# Patient Record
Sex: Female | Born: 1965 | Race: White | Hispanic: No | Smoking: Current every day smoker
Health system: Southern US, Community
[De-identification: ages and names within clinical notes are randomized; demographics above are authoritative.]

## PROBLEM LIST (undated history)

## (undated) DIAGNOSIS — Z972 Presence of dental prosthetic device (complete) (partial): Secondary | ICD-10-CM

## (undated) DIAGNOSIS — I10 Essential (primary) hypertension: Secondary | ICD-10-CM

## (undated) DIAGNOSIS — R87629 Unspecified abnormal cytological findings in specimens from vagina: Secondary | ICD-10-CM

## (undated) DIAGNOSIS — F329 Major depressive disorder, single episode, unspecified: Secondary | ICD-10-CM

## (undated) DIAGNOSIS — M797 Fibromyalgia: Secondary | ICD-10-CM

## (undated) DIAGNOSIS — D241 Benign neoplasm of right breast: Secondary | ICD-10-CM

## (undated) DIAGNOSIS — Z8711 Personal history of peptic ulcer disease: Secondary | ICD-10-CM

## (undated) DIAGNOSIS — F419 Anxiety disorder, unspecified: Secondary | ICD-10-CM

## (undated) DIAGNOSIS — Z973 Presence of spectacles and contact lenses: Secondary | ICD-10-CM

## (undated) DIAGNOSIS — K219 Gastro-esophageal reflux disease without esophagitis: Secondary | ICD-10-CM

## (undated) DIAGNOSIS — N281 Cyst of kidney, acquired: Secondary | ICD-10-CM

## (undated) DIAGNOSIS — N901 Moderate vulvar dysplasia: Secondary | ICD-10-CM

## (undated) DIAGNOSIS — Z87410 Personal history of cervical dysplasia: Secondary | ICD-10-CM

## (undated) DIAGNOSIS — K589 Irritable bowel syndrome without diarrhea: Secondary | ICD-10-CM

## (undated) DIAGNOSIS — Z8719 Personal history of other diseases of the digestive system: Secondary | ICD-10-CM

## (undated) DIAGNOSIS — F32A Depression, unspecified: Secondary | ICD-10-CM

## (undated) HISTORY — DX: Essential (primary) hypertension: I10

## (undated) HISTORY — PX: OTHER SURGICAL HISTORY: SHX169

## (undated) HISTORY — DX: Unspecified abnormal cytological findings in specimens from vagina: R87.629

## (undated) HISTORY — PX: CERVICAL CONIZATION W/BX: SHX1330

---

## 1990-09-23 HISTORY — PX: REPAIR OF PERFORATED ULCER: SHX6065

## 2006-11-13 ENCOUNTER — Inpatient Hospital Stay (HOSPITAL_COMMUNITY): Admission: RE | Admit: 2006-11-13 | Discharge: 2006-11-14 | Payer: Self-pay | Admitting: Obstetrics and Gynecology

## 2006-11-13 ENCOUNTER — Encounter (INDEPENDENT_AMBULATORY_CARE_PROVIDER_SITE_OTHER): Payer: Self-pay | Admitting: *Deleted

## 2006-11-13 HISTORY — PX: VAGINAL HYSTERECTOMY: SUR661

## 2009-01-05 ENCOUNTER — Emergency Department (HOSPITAL_COMMUNITY): Admission: EM | Admit: 2009-01-05 | Discharge: 2009-01-05 | Payer: Self-pay | Admitting: Emergency Medicine

## 2009-03-18 ENCOUNTER — Emergency Department (HOSPITAL_COMMUNITY): Admission: EM | Admit: 2009-03-18 | Discharge: 2009-03-18 | Payer: Self-pay | Admitting: Emergency Medicine

## 2010-10-14 ENCOUNTER — Encounter: Payer: Self-pay | Admitting: Obstetrics and Gynecology

## 2011-02-08 NOTE — H&P (Signed)
NAMEJANARI, [name]                 ACCOUNT NO.:  [id number]   MEDICAL RECORD NO.:  [id number]          PATIENT TYPE:  AMB   LOCATION:  DAY                           FACILITY:  [facility]   PHYSICIAN:  [name], M.D. DATE OF BIRTH:  10-Jul-1966   DATE OF ADMISSION:  11/13/2006  DATE OF DISCHARGE:  LH                              HISTORY & PHYSICAL   ADMISSION DIAGNOSIS:  1. Dysmenorrhea.  2. Dyspareunia.  3. Recurrent cervical dysplasia, status post conization.  4. History of fibromyalgia.   HISTORY OF PRESENT ILLNESS:  This 45 year old female is referred to our  office for evaluation of really bad periods by Dr. Donzetta Johns of  [facility].  She has premenstrual spotting, gets terribly sick right  before her period with chills, cramps, pain, and upset stomach.  She  hurts to the right side a lot for the last two to three months.  She is  not working.  She has no significant weight change premenstrually.  She  does have a history of irritable bowel syndrome since childhood.  She  has been referred and there is a question of endometriosis.  During her  referral, she was being considered for endometrial ablation, but after  discussion of the treatment options, she declines that.  Medical history  is also notable for chronic pain and suspected fibromyalgia.   SURGICAL HISTORY:  1. Tubal ligation in 2002.  2. Also surgery in 1991 at [facility] for bladder sling done      by Dr. Jerre Johns in the past.  3. She describes ulcer surgery in 1991 by Dr. Katrinka Johns at [facility] as being emergency surgery where she was on death's      doorstep.  She has an umbilical and old right upper quadrant cuts      from that.   ALLERGIES:  She is allergic to SULFA.   MEDICATIONS:  1. Tramadol 50 mg daily.  2. Ulcer medication taken daily.   PHYSICAL EXAMINATION:  GENERAL:  Slim, Caucasian female who appears  appropriate, oriented, with somewhat passive demeanor.  HEENT:  Pupils  are equal, round and reactive.  NECK:  Supple.  CHEST:  Clear to auscultation.  ABDOMEN:  Well-healed surgical scars as described earlier.  EXTERNAL GENITALIA:  Normal vaginal exam shows good vaginal length.  There is a tiny cervix.  Uterus was retroverted, retroflexed, mobile  without adnexa _thickening__.  The uterosacral ligaments are palpable  but show no appreciable nodularity.   IMPRESSION:  1. Dysmenorrhea.  2. Dyspareunia.  3. History of cervical dysplasia.  4. History of lifelong irritable bowel syndrome.  5. Fibromyalgia.   PLAN:  Vaginal hysterectomy.  I had a discussion of the pros and cons of  surgery being performed.  The patient was made aware that if we see  anything that shows abnormality or adhesions around the ovaries, that  they will be taken out at the time of surgery.  Our intent is to  complete the procedure vaginally.  If laparoscopy is to be considered,  it will most  likely need to be open laparoscopy given the previous  midline abdominal surgical scars.      [name], M.D.  Electronically Signed     JVF/MEDQ  D:  11/05/2006  T:  11/06/2006  Job:  [id number]   cc:   [name]  Fax: [phone]

## 2011-02-08 NOTE — Op Note (Signed)
[name], [name]                 ACCOUNT NO.:  [id number]   MEDICAL RECORD NO.:  [id number]          PATIENT TYPE:  INP   LOCATION:  A419                          FACILITY:  [facility]   PHYSICIAN:  Tilda Burrow, M.D. DATE OF BIRTH:  [birth year]   DATE OF PROCEDURE:  11/13/2006  DATE OF DISCHARGE:                               OPERATIVE REPORT   PREOP:  1. Dysmenorrhea.  2. Dyspareunia.  3. History of cervical dysplasia.   POSTOP:  1. Dysmenorrhea.  2. Dyspareunia.  3. History of cervical dysplasia.   PROCEDURE:  Vaginal hysterectomy.   SURGEON:  Ferguson.   ASSISTANT:  Dallas, R.N.   ANESTHESIA:  General.   COMPLICATIONS:  None.   FINDINGS:  1. Healthy vaginal tissues.  2. A small amount of residual cervical dysplasia.  3. No evidence of endometriosis on uterosacral ligaments or ovaries.  4. On visual inspection, lax ovarian support bilaterally.  5. Small cystocele anterior warranting trimming of the anterior      vaginal mucosa.   DETAILS OF PROCEDURE:  The patient was taken to the operating room,  prepped and draped for vaginal procedure with high lithotomy leg  support.  Abdomen and perineum were prepped and draped.  A posterior  colpotomy incision was made with a weighted 3-inch speculum in place.  The cervix was circumscribed with Bovie and bladder flap developed  anteriorly but peritoneum not entered.  We then proceeded with curved  Zepplin clamp, cross-clamping of the uterosacral ligaments on either  side, suture ligating these and tagging them with curved hemostats.  The  lower cardinal ligament were then clamped, cut and suture ligated in  small bites marching up through the upper cardinal ligaments, clamping,  cutting and suture ligating without difficulty.  The uterine vessels  were incorporated in these ligatures and the broad ligament serially  clamped with Zepplin clamp, transected with Mayo scissors and suture  ligated always with 0 chromic suture.   Marched up to the level of the  utero-ovarian ligaments which were clamped, cut and suture ligated  separately on the left.  The ovary was inspected and found to have no  evidence of endometriosis.  The right side was treated similarly with  clamping, cutting and suture ligating of the round ligament, fallopian  tube and utero-ovarian ligament in one single pedicle.  The pedicles  were inspected and there was some oozing from the area of the  uterosacral ligament clampings on first the left side, which was treated  with an additional ligature, and then on the right side we replaced the  uterosacral ligament on the right side.  This resulted in completely  satisfactory hemostasis.  The ovaries were inspected bilaterally, no  evidence of endometriosis on either one could be noted.  It was noted  that both tubes and ovaries descend quite well all the way to the cuff.  A #2-0 Prolene short suture was placed attaching the uterosacral  ligament on each side to a portion of the vaginal cuff approximately 1  cm medial to that in order to maximize the vaginal cuff support.  This  was tied down.  The peritoneum was then closed transversely with a  running locking suture of #2-0 chromic.  The vaginal margins status post  hysterectomy were inspected.  The anterior bladder came down enough that  some improvement of anterior support was considered indicated and so a  1.5 x 1.5 cm wedge of skin from the anterior vaginal edge was trimmed  and then horizontal mattress sutures x3 with #2-0 chromic used to pull  this tissue together, improving the apical support beneath the bladder.  The cuff was then closed transversely, resulting in a 3-pronged closure  to the cuff with good hemostasis and satisfactory support.  Vaginal  packing was placed, Foley catheter was inserted and patient went to  recovery room in stable condition.      Tilda Burrow, M.D.  Electronically Signed     JVF/MEDQ  D:   11/13/2006  T:  11/13/2006  Job:  [id number]

## 2011-02-08 NOTE — Discharge Summary (Signed)
NAMEEMBERLYN, [name]                 ACCOUNT NO.:  [id number]   MEDICAL RECORD NO.:  [id number]          PATIENT TYPE:  INP   LOCATION:  A419                          FACILITY:  [facility]   PHYSICIAN:  Tilda Burrow, M.D. DATE OF BIRTH:  [birth year]   DATE OF ADMISSION:  11/13/2006  DATE OF DISCHARGE:  02/22/2008LH                               DISCHARGE SUMMARY   ADMITTING DIAGNOSES:  1. Dysmenorrhea.  2. Dyspareunia.  3. Recurrent cervical dysplasia status post conization.  4. History of fibromyalgia.  5. Irritable bowel syndrome.   DISCHARGE DIAGNOSES:  1. Dysmenorrhea.  2. Dyspareunia.  3. Recurrent cervical dysplasia status post conization.  4. History of fibromyalgia.   PROCEDURE:  Vaginal hysterectomy, J.V. Emelda Fear.   DISCHARGE MEDICATIONS:  1. AcipHex 20 mg p.o. q.d.  2. Ultram 50 mg p.o. t.i.d.  3. Goody Powders p.r.n.  4. Tylox 1 q.4 h. p.r.n. pain.  5. Motrin 800 mg q.8 h.   HOSPITAL COURSE:  This 45 year old female was admitted to the office for  really bad periods.  She was referred from her primary care physician,  Dr. Donzetta Sprung.  She was being considered for endometrial ablation,  but after discussion declines that.  She desires hysterectomy, and given  her low pain threshold associated with fibromyalgia and irritable bowel  syndrome, it was considered reasonable.   HOSPITAL COURSE:  The patient was admitted, underwent vaginal  hysterectomy on February 21 as described in the operative report.  She  had hemoglobin 14.3, hematocrit 42.1 pre-op, 14.2 and 41.6 post-op.  Electrolytes showed a potassium of 3.9, BUN 7, creatinine 0.72.  Pathology report showed proliferative endometrium, no dysplasia of the  cervix.  Normal portions of left fallopian tube and normal posterior  vaginal mucosa.  There was a small trimming of the anterior vaginal  mucosa to reduce the potential for a cystocele in the future.  She was  stable for discharge and routine post-surgical  instructions on November 14, 2006.  Followup for 1 week, then 4 weeks [facility].      Tilda Burrow, M.D.  Electronically Signed     JVF/MEDQ  D:  12/10/2006  T:  12/11/2006  Job:  [id number]

## 2013-08-31 ENCOUNTER — Other Ambulatory Visit: Payer: Self-pay

## 2013-09-29 ENCOUNTER — Emergency Department (HOSPITAL_COMMUNITY)
Admission: EM | Admit: 2013-09-29 | Discharge: 2013-09-29 | Discharge status: Against Medical Advice | Payer: Medicaid Other | Attending: Emergency Medicine | Admitting: Emergency Medicine

## 2013-09-29 ENCOUNTER — Encounter (HOSPITAL_COMMUNITY): Payer: Self-pay | Admitting: Emergency Medicine

## 2013-09-29 DIAGNOSIS — J111 Influenza due to unidentified influenza virus with other respiratory manifestations: Secondary | ICD-10-CM | POA: Insufficient documentation

## 2013-09-29 DIAGNOSIS — F17200 Nicotine dependence, unspecified, uncomplicated: Secondary | ICD-10-CM | POA: Insufficient documentation

## 2013-09-29 HISTORY — DX: Irritable bowel syndrome, unspecified: K58.9

## 2013-09-29 NOTE — ED Notes (Signed)
Pt called to take to treatment area. PT not found in waiting area or outside of ED. Wheelchair that pt was in was empty and blanket was balled up in chair. PT was not in restroom. Others in waiting area state that no pt had been there for a while.

## 2013-09-29 NOTE — ED Notes (Signed)
Pt reports flu like symptoms since yesterday

## 2014-04-15 ENCOUNTER — Emergency Department (HOSPITAL_COMMUNITY)
Admission: EM | Admit: 2014-04-15 | Discharge: 2014-04-15 | Disposition: A | Discharge status: Home / Self Care | Payer: Medicaid Other | Attending: Emergency Medicine | Admitting: Emergency Medicine

## 2014-04-15 ENCOUNTER — Encounter (HOSPITAL_COMMUNITY): Payer: Self-pay | Admitting: Emergency Medicine

## 2014-04-15 DIAGNOSIS — Z79899 Other long term (current) drug therapy: Secondary | ICD-10-CM | POA: Insufficient documentation

## 2014-04-15 DIAGNOSIS — I10 Essential (primary) hypertension: Secondary | ICD-10-CM | POA: Insufficient documentation

## 2014-04-15 DIAGNOSIS — K219 Gastro-esophageal reflux disease without esophagitis: Secondary | ICD-10-CM | POA: Insufficient documentation

## 2014-04-15 DIAGNOSIS — R42 Dizziness and giddiness: Secondary | ICD-10-CM | POA: Insufficient documentation

## 2014-04-15 DIAGNOSIS — F17200 Nicotine dependence, unspecified, uncomplicated: Secondary | ICD-10-CM | POA: Insufficient documentation

## 2014-04-15 DIAGNOSIS — R110 Nausea: Secondary | ICD-10-CM | POA: Diagnosis present

## 2014-04-15 LAB — COMPREHENSIVE METABOLIC PANEL
ALT: 10 U/L (ref 0–35)
ANION GAP: 8 (ref 5–15)
AST: 13 U/L (ref 0–37)
Albumin: 3.7 g/dL (ref 3.5–5.2)
Alkaline Phosphatase: 57 U/L (ref 39–117)
BUN: 7 mg/dL (ref 6–23)
CALCIUM: 9.3 mg/dL (ref 8.4–10.5)
CO2: 29 mEq/L (ref 19–32)
Chloride: 104 mEq/L (ref 96–112)
Creatinine, Ser: 0.72 mg/dL (ref 0.50–1.10)
GFR calc Af Amer: >90 mL/min (ref >90)
Glucose, Bld: 81 mg/dL (ref 70–99)
Potassium: 4.4 mEq/L (ref 3.7–5.3)
Sodium: 141 mEq/L (ref 137–147)
Total Bilirubin: <0.2 mg/dL — ABNORMAL LOW (ref 0.3–1.2)
Total Protein: 6.5 g/dL (ref 6.0–8.3)

## 2014-04-15 LAB — CBC WITH DIFFERENTIAL/PLATELET
BASOS ABS: 0 10*3/uL (ref 0.0–0.1)
BASOS PCT: 1 % (ref 0–1)
EOS PCT: 1 % (ref 0–5)
Eosinophils Absolute: 0.1 10*3/uL (ref 0.0–0.7)
HCT: 46.7 % — ABNORMAL HIGH (ref 36.0–46.0)
Hemoglobin: 15.8 g/dL — ABNORMAL HIGH (ref 12.0–15.0)
LYMPHS ABS: 1.1 10*3/uL (ref 0.7–4.0)
Lymphocytes Relative: 26 % (ref 12–46)
MCH: 35.2 pg — AB (ref 26.0–34.0)
MCHC: 33.8 g/dL (ref 30.0–36.0)
MCV: 104 fL — ABNORMAL HIGH (ref 78.0–100.0)
MONO ABS: 0.4 10*3/uL (ref 0.1–1.0)
Monocytes Relative: 9 % (ref 3–12)
NEUTROS ABS: 2.8 10*3/uL (ref 1.7–7.7)
NEUTROS PCT: 63 % (ref 43–77)
PLATELETS: 346 10*3/uL (ref 150–400)
RBC: 4.49 MIL/uL (ref 3.87–5.11)
RDW: 12.7 % (ref 11.5–15.5)
WBC: 4.4 10*3/uL (ref 4.0–10.5)

## 2014-04-15 LAB — URINE MICROSCOPIC-ADD ON

## 2014-04-15 LAB — URINALYSIS, ROUTINE W REFLEX MICROSCOPIC
Bilirubin Urine: NEGATIVE
GLUCOSE, UA: NEGATIVE mg/dL
Ketones, ur: NEGATIVE mg/dL
Nitrite: NEGATIVE
Protein, ur: NEGATIVE mg/dL
Specific Gravity, Urine: 1.01 (ref 1.005–1.030)
Urobilinogen, UA: 0.2 mg/dL (ref 0.0–1.0)
pH: 6.5 (ref 5.0–8.0)

## 2014-04-15 MED ORDER — ONDANSETRON 8 MG PO TBDP
8.0000 mg | ORAL_TABLET | Freq: Once | ORAL | Status: AC
  Administered 2014-04-15: 8 mg via ORAL
  Filled 2014-04-15: qty 1

## 2014-04-15 NOTE — ED Provider Notes (Signed)
CSN: [id number]     Arrival date & time 04/15/14  1117 History   First MD Initiated Contact with Patient 04/15/14 1145     Chief Complaint  Patient presents with  . Hypertension     (Consider location/radiation/quality/duration/timing/severity/associated sxs/prior Treatment) The history is provided by the patient and a parent (mother at bedside).    NIA NATHANIEL is a 48 y.o. female presenting with complaint of feeling lightheaded which she woke with 3 mornings ago.  She also has mild nausea without emesis.  She describes a slight dizzy sensation, but denies spinning sensation which is most noticeable when she lies down.  She denies headache, fever, visual changes, focal weakness,  Neck pain or stiffness and has had no recent illnesses. She had an elevated blood pressure at work this am 140/86.  Her blood pressure has been low recently so was taken off her Norvasc, her last dose was taken 1 week ago. Additionally she denies chest pain, shortness of breath, palpation and has no peripheral edema.  She works indoors and drinks plenty of fluids,  Denies risk factors for dehydration.   She was seen by her pcp this morning and was told she may have vertigo.  She presents here for confirmation of this diagnosis (pcp did not send her here).      Past Medical History  Diagnosis Date  . IBS (irritable bowel syndrome)   . Gastric ulcer   . Acid reflux    Past Surgical History  Procedure Laterality Date  . Abdominal surgery    . Abdominal hysterectomy    . Bladder suspension     No family history on file. History  Substance Use Topics  . Smoking status: Current Every Day Smoker    Types: Cigarettes  . Smokeless tobacco: Not on file  . Alcohol Use: No   OB History   Grav Para Term Preterm Abortions TAB SAB Ect Mult Living                 Review of Systems  Constitutional: Negative for fever and chills.  HENT: Negative for congestion, ear pain, facial swelling, hearing loss and sore  throat.   Eyes: Negative.  Negative for visual disturbance.  Respiratory: Negative for chest tightness and shortness of breath.   Cardiovascular: Negative for chest pain, palpitations and leg swelling.  Gastrointestinal: Positive for nausea. Negative for abdominal pain.  Genitourinary: Negative.  Negative for difficulty urinating.  Musculoskeletal: Negative for arthralgias, joint swelling and neck pain.  Skin: Negative.  Negative for rash and wound.  Neurological: Positive for light-headedness. Negative for dizziness, weakness, numbness and headaches.  Psychiatric/Behavioral: Negative.       Allergies  Sulfa antibiotics  Home Medications   Prior to Admission medications   Medication Sig Start Date End Date Taking? Authorizing Provider  Aspirin-Acetaminophen (GOODYS BODY PAIN PO) Take 1 Package by mouth daily as needed (pain).   Yes Historical Provider, MD  Multiple Vitamin (MULTIVITAMIN WITH MINERALS) TABS tablet Take 1 tablet by mouth daily.   Yes Historical Provider, MD  omeprazole (PRILOSEC) 20 MG capsule Take 20 mg by mouth daily.   Yes Historical Provider, MD  traMADol (ULTRAM) 50 MG tablet Take 50 mg by mouth every 6 (six) hours as needed for moderate pain.   Yes Historical Provider, MD  vitamin C (ASCORBIC ACID) 500 MG tablet Take 500 mg by mouth daily.   Yes Historical Provider, MD  amLODipine (NORVASC) 10 MG tablet Take 10 mg by mouth daily.  Historical Provider, MD   BP 137/89  Pulse 92  Temp(Src) 98.7 F (37.1 C) (Oral)  Resp 16  Ht 5\' 4"  (1.626 m)  Wt 110 lb (49.896 kg)  BMI 18.87 kg/m2  SpO2 99% Physical Exam  Nursing note and vitals reviewed. Constitutional: She is oriented to person, place, and time. She appears well-developed and well-nourished.  Uncomfortable appearing  HENT:  Head: Normocephalic and atraumatic.  Mouth/Throat: Oropharynx is clear and moist.  Eyes: Conjunctivae and EOM are normal. Pupils are equal, round, and reactive to light.  Neck:  Normal range of motion. Neck supple.  Cardiovascular: Normal rate, regular rhythm, normal heart sounds and intact distal pulses.   Pulmonary/Chest: Effort normal and breath sounds normal. She has no wheezes.  Abdominal: Soft. Bowel sounds are normal. There is no tenderness.  Musculoskeletal: Normal range of motion.  Lymphadenopathy:    She has no cervical adenopathy.  Neurological: She is alert and oriented to person, place, and time. She has normal strength. No sensory deficit. Gait normal. GCS eye subscore is 4. GCS verbal subscore is 5. GCS motor subscore is 6.  Normal heel-shin, normal rapid alternating movements. Cranial nerves III-XII intact.  No pronator drift.  Skin: Skin is warm and dry. No rash noted.  Psychiatric: She has a normal mood and affect. Her speech is normal and behavior is normal. Thought content normal. Cognition and memory are normal.    ED Course  Procedures (including critical care time) Labs Review Labs Reviewed  CBC WITH DIFFERENTIAL - Abnormal; Notable for the following:    Hemoglobin 15.8 (*)    HCT 46.7 (*)    MCV 104.0 (*)    MCH 35.2 (*)    All other components within normal limits  COMPREHENSIVE METABOLIC PANEL - Abnormal; Notable for the following:    Total Bilirubin <0.2 (*)    All other components within normal limits  URINALYSIS, ROUTINE W REFLEX MICROSCOPIC - Abnormal; Notable for the following:    Color, Urine STRAW (*)    APPearance HAZY (*)    Hgb urine dipstick SMALL (*)    Leukocytes, UA SMALL (*)    All other components within normal limits  URINE MICROSCOPIC-ADD ON - Abnormal; Notable for the following:    Bacteria, UA FEW (*)    All other components within normal limits    Imaging Review No results found.   EKG Interpretation   Date/Time:  Friday April 15 2014 11:43:14 EDT Ventricular Rate:  71 PR Interval:  119 QRS Duration: 89 QT Interval:  376 QTC Calculation: 409 R Axis:   55 Text Interpretation:  Sinus rhythm  Borderline short PR interval Low  voltage, precordial leads No old tracing to compare Confirmed by KNAPP   MD-J, JON ([id number]) on 04/15/2014 11:54:54 AM      MDM   Final diagnoses:  Lightheadedness  HTN (hypertension), benign    Labs reviewed,  Pt not orthostatic.  No urinary sx, culture ordered.  She was prescribed meclizine by her pcp today but has not started yet.  Advised to start taking this medicine today.  Advised f/u by pcp within 1 week,  Sooner if sx worsen.  No focal deficits on exam.  Sx most c/w peripheral vertigo.    Evalee Jefferson, PA-C 04/15/14 1401

## 2014-04-15 NOTE — ED Notes (Addendum)
Pt c/o elevated bp since Monday. Pt c/o dizziness/n and  bp 140/86 at work this am. Pt was seen by pcp this am, states they did not do anything for her but mentioned vertigo. Denies ha/cp/numbness/tingling. nad noted.

## 2014-04-15 NOTE — ED Provider Notes (Signed)
Medical screening examination/treatment/procedure(s) were performed by non-physician practitioner and as supervising physician I was immediately available for consultation/collaboration.   EKG Interpretation   Date/Time:  Friday April 15 2014 11:43:14 EDT Ventricular Rate:  71 PR Interval:  119 QRS Duration: 89 QT Interval:  376 QTC Calculation: 409 R Axis:   55 Text Interpretation:  Sinus rhythm Borderline short PR interval Low  voltage, precordial leads No old tracing to compare Confirmed by Makyla Bye   MD-J, Saraann Enneking ([id number]) on 04/15/2014 11:54:54 AM        Dorie Rank, MD 04/15/14 1440

## 2014-04-15 NOTE — Discharge Instructions (Signed)
Dizziness  Dizziness means you feel unsteady or lightheaded. You might feel like you are going to pass out (faint). HOME CARE   Drink enough fluids to keep your pee (urine) clear or pale yellow.  Take your medicines exactly as told by your doctor. If you take blood pressure medicine, always stand up slowly from the lying or sitting position. Hold on to something to steady yourself.  If you need to stand in one place for a long time, move your legs often. Tighten and relax your leg muscles.  Have someone stay with you until you feel okay.  Do not drive or use heavy machinery if you feel dizzy.  Do not drink alcohol. GET HELP RIGHT AWAY IF:   You feel dizzy or lightheaded and it gets worse.  You feel sick to your stomach (nauseous), or you throw up (vomit).  You have trouble talking or walking.  You feel weak or have trouble using your arms, hands, or legs.  You cannot think clearly or have trouble forming sentences.  You have chest pain, belly (abdominal) pain, sweating, or you are short of breath.  Your vision changes.  You are bleeding.  You have problems from your medicine that seem to be getting worse. MAKE SURE YOU:   Understand these instructions.  Will watch your condition.  Will get help right away if you are not doing well or get worse. Document Released: 08/29/2011 Document Revised: 12/02/2011 Document Reviewed: 08/29/2011 [facility] Patient Information 2015 [place], [place]. This information is not intended to replace advice given to you by your health care provider. Make sure you discuss any questions you have with your health care provider.  Vertigo Vertigo means you feel like you are moving when you are not. Vertigo can make you feel like things around you are moving when they are not. This problem often goes away on its own.  HOME CARE   Follow your doctor's instructions.  Avoid driving.  Avoid using heavy machinery.  Avoid doing any activity that  could be dangerous if you have a vertigo attack.  Tell your doctor if a medicine seems to cause your vertigo. GET HELP RIGHT AWAY IF:   Your medicines do not help or make you feel worse.  You have trouble talking or walking.  You feel weak or have trouble using your arms, hands, or legs.  You have bad headaches.  You keep feeling sick to your stomach (nauseous) or throwing up (vomiting).  Your vision changes.  A family member notices changes in your behavior.  Your problems get worse. MAKE SURE YOU:  Understand these instructions.  Will watch your condition.  Will get help right away if you are not doing well or get worse. Document Released: 06/18/2008 Document Revised: 12/02/2011 Document Reviewed: 03/28/2011 [facility] Patient Information 2015 [place], [place]. This information is not intended to replace advice given to you by your health care provider. Make sure you discuss any questions you have with your health care provider.

## 2014-04-15 NOTE — ED Notes (Signed)
MD at bedside. 

## 2014-04-18 LAB — URINE CULTURE: Colony Count: >=100000

## 2014-04-19 ENCOUNTER — Telehealth (HOSPITAL_BASED_OUTPATIENT_CLINIC_OR_DEPARTMENT_OTHER): Payer: Self-pay | Admitting: Emergency Medicine

## 2014-04-19 NOTE — Telephone Encounter (Signed)
Post ED Visit - Positive Culture Follow-up  Culture report reviewed by antimicrobial stewardship pharmacist: []  Antoine, Pharm.D., BCPS [x]  Heide Guile, Pharm.D., BCPS []  Alycia Rossetti, Pharm.D., BCPS []  [place], Pharm.D., BCPS, AAHIVP []  Legrand Como, Pharm.D., BCPS, AAHIVP  Positive urine culture Per [facility] PA-C, no treatment needed and no further patient follow-up is required at this time.  [place], Rex Kras 04/19/2014, 10:02 AM

## 2015-03-13 ENCOUNTER — Emergency Department (HOSPITAL_COMMUNITY)
Admission: EM | Admit: 2015-03-13 | Discharge: 2015-03-13 | Disposition: A | Discharge status: Home / Self Care | Payer: Medicaid Other | Attending: Emergency Medicine | Admitting: Emergency Medicine

## 2015-03-13 ENCOUNTER — Emergency Department (HOSPITAL_COMMUNITY): Payer: Medicaid Other

## 2015-03-13 ENCOUNTER — Encounter (HOSPITAL_COMMUNITY): Payer: Self-pay | Admitting: *Deleted

## 2015-03-13 DIAGNOSIS — K219 Gastro-esophageal reflux disease without esophagitis: Secondary | ICD-10-CM | POA: Diagnosis not present

## 2015-03-13 DIAGNOSIS — Z720 Tobacco use: Secondary | ICD-10-CM | POA: Insufficient documentation

## 2015-03-13 DIAGNOSIS — N644 Mastodynia: Secondary | ICD-10-CM | POA: Diagnosis not present

## 2015-03-13 DIAGNOSIS — M7989 Other specified soft tissue disorders: Secondary | ICD-10-CM | POA: Diagnosis not present

## 2015-03-13 DIAGNOSIS — I10 Essential (primary) hypertension: Secondary | ICD-10-CM | POA: Insufficient documentation

## 2015-03-13 DIAGNOSIS — Z79899 Other long term (current) drug therapy: Secondary | ICD-10-CM | POA: Diagnosis not present

## 2015-03-13 DIAGNOSIS — M79601 Pain in right arm: Secondary | ICD-10-CM

## 2015-03-13 HISTORY — DX: Essential (primary) hypertension: I10

## 2015-03-13 MED ORDER — NAPROXEN 500 MG PO TABS: 500.0000 mg | ORAL_TABLET | Freq: Two times a day (BID) | ORAL | Status: DC

## 2015-03-13 NOTE — ED Notes (Signed)
Pain rt breast, s/p surgery at [facility] in May.  Cont to hurt with movement  And tender to touch.  Had cysts removed and the bx was normal

## 2015-03-13 NOTE — ED Provider Notes (Signed)
CSN: [id number]     Arrival date & time 03/13/15  1147 History  This chart was scribed for Noemi Chapel, MD by Rayna Sexton, ED scribe. This patient was seen in room APA03/APA03 and the patient's care was started at 12:18 PM.    Chief Complaint  Patient presents with  . Breast Pain   The history is provided by the patient. No language interpreter was used.    HPI Comments: JEANNIA TATRO is a 49 y.o. female who presents to the Emergency Department complaining of intermittent, moderate, radiating, generalized pain to her right breast, shoulder and arm with onset 1.5 weeks ago. Pt notes a history of multiple biopsies (all negative) of her right breast as well as cyst removals with her most recent being in May of 2016 at [facility]. She notes a worsening of symptoms when ambulating the arm as well as when palpating the right breast. Pt notes a history of smoking and currently smokes 1 PPD. She denies taking blood thinner, estrogen, or hormone pills. Pt denies a history of breast surgery as well as any associated discharge of the breasts bilaterally. Pt denies any other associated symptoms.    Past Medical History  Diagnosis Date  . IBS (irritable bowel syndrome)   . Gastric ulcer   . Acid reflux   . Hypertension    Past Surgical History  Procedure Laterality Date  . Abdominal surgery    . Abdominal hysterectomy    . Bladder suspension    . Breast surgery     History reviewed. No pertinent family history. History  Substance Use Topics  . Smoking status: Current Every Day Smoker    Types: Cigarettes  . Smokeless tobacco: Not on file  . Alcohol Use: No   OB History    No data available     Review of Systems  Constitutional: Negative for fever and chills.  Cardiovascular:       + Right Breast Pain  All other systems reviewed and are negative.     Allergies  Sulfa antibiotics  Home Medications   Prior to Admission medications   Medication Sig Start Date  End Date Taking? Authorizing Provider  acetaminophen (TYLENOL) 500 MG tablet Take 1,000 mg by mouth every 6 (six) hours as needed for moderate pain.   Yes Historical Provider, MD  amLODipine (NORVASC) 10 MG tablet Take 10 mg by mouth daily.   Yes Historical Provider, MD  Aspirin-Acetaminophen (GOODYS BODY PAIN PO) Take 1 Package by mouth daily as needed (pain).   Yes Historical Provider, MD  omeprazole (PRILOSEC) 20 MG capsule Take 20 mg by mouth daily.   Yes Historical Provider, MD  promethazine (PHENERGAN) 25 MG tablet Take 1 tablet by mouth every 6 (six) hours as needed. 01/17/15  Yes Historical Provider, MD  torsemide (DEMADEX) 10 MG tablet Take 10 mg by mouth daily as needed (fluid).    Yes Historical Provider, MD  traMADol (ULTRAM) 50 MG tablet Take 50 mg by mouth every 6 (six) hours as needed for moderate pain.   Yes Historical Provider, MD  naproxen (NAPROSYN) 500 MG tablet Take 1 tablet (500 mg total) by mouth 2 (two) times daily with a meal. 03/13/15   Noemi Chapel, MD   BP 116/90 mmHg  Pulse 83  Temp(Src) 98.3 F (36.8 C) (Oral)  Resp 16  Ht 5\' 4"  (1.626 m)  Wt 120 lb (54.432 kg)  BMI 20.59 kg/m2  SpO2 98% Physical Exam  Constitutional: She appears well-developed  and well-nourished.  HENT:  Head: Normocephalic and atraumatic.  Eyes: Conjunctivae are normal. Right eye exhibits no discharge. Left eye exhibits no discharge.  Pulmonary/Chest: Effort normal. No respiratory distress.  Chaperone present Right breast pain inferior laterally into the right axilla and right promixal arm; worsens with movement of the arm and palpation of the breast; no associated discharge from the nipple or swelling of breast - small mass at approximately 7 oclock  Musculoskeletal: She exhibits tenderness.  No asymmetry of the arm; normal ROM of all joints of upper extremities; supple joints; soft compartments  Neurological: She is alert. Coordination normal.  Skin: Skin is warm and dry. No rash noted.  She is not diaphoretic. No erythema.  Psychiatric: She has a normal mood and affect.  Nursing note and vitals reviewed.   ED Course  Procedures  DIAGNOSTIC STUDIES: Oxygen Saturation is 100% on RA, normal by my interpretation.    COORDINATION OF CARE: 12:25 PM Discussed treatment plan with pt at bedside and pt agreed to plan.  Labs Reviewed - No data to display  Imaging Review US Venous Img Upper Uni Right  03/13/2015   CLINICAL DATA:  Right shoulder and right arm pain. Previous right breast biopsies. Right axillary pain.  EXAM: RIGHT UPPER EXTREMITY VENOUS DOPPLER ULTRASOUND  TECHNIQUE: Gray-scale sonography with graded compression, as well as color Doppler and duplex ultrasound were performed to evaluate the upper extremity deep venous system from the level of the subclavian vein and including the jugular, axillary, basilic and upper cephalic vein. Spectral Doppler was utilized to evaluate flow at rest and with distal augmentation maneuvers.  COMPARISON:  None.  FINDINGS: Thrombus within deep veins:  None visualized.  Normal compressibility, color Doppler flow and phasicity in the right internal jugular vein. Normal compressibility, color Doppler flow and phasicity in the right subclavian vein. Normal compressibility, color Doppler flow and augmentation in the right axillary vein. No gross abnormality at the area of concern which is in the right axilla. Normal compressibility, augmentation and color Doppler flow in the visualized right brachial veins. The right basilic vein is patent with normal compressibility. Radial and ulnar veins are patent. Visualized right cephalic vein is patent.  Other findings: Normal compressibility, color Doppler flow and phasicity in the left subclavian vein.  IMPRESSION: Negative for deep vein thrombosis in the right upper extremity.   Electronically Signed   By: Markus Daft M.D.   On: 03/13/2015 14:30      MDM   Final diagnoses:  Arm swelling  Breast pain     The patient has a normal ultrasound showing no signs of DVT in the right upper extremity. Vital signs are normal, patient informed of her results, we'll start on Naprosyn, follow-up with family doctor.  There are no signs of redness or swelling of the arm, normal pulses and strength, breast tissue appears normal as well. She still has a very small palpable mass in the breast, she has been informed that she needs close follow-up this week. She expresses her understanding.  I personally performed the services described in this documentation, which was scribed in my presence. The recorded information has been reviewed and is accurate.       Noemi Chapel, MD 03/13/15 [phone]

## 2015-03-13 NOTE — Discharge Instructions (Signed)
Please call your doctor for a followup appointment within 24-48 hours. When you talk to your doctor please let them know that you were seen in the emergency department and have them acquire all of your records so that they can discuss the findings with you and formulate a treatment plan to fully care for your new and ongoing problems. ° °

## 2015-05-01 ENCOUNTER — Other Ambulatory Visit: Payer: Self-pay | Admitting: General Surgery

## 2015-05-01 DIAGNOSIS — D241 Benign neoplasm of right breast: Secondary | ICD-10-CM

## 2015-05-05 ENCOUNTER — Other Ambulatory Visit: Payer: Self-pay | Admitting: General Surgery

## 2015-05-05 DIAGNOSIS — D241 Benign neoplasm of right breast: Secondary | ICD-10-CM

## 2015-05-15 ENCOUNTER — Encounter (HOSPITAL_BASED_OUTPATIENT_CLINIC_OR_DEPARTMENT_OTHER): Payer: Self-pay | Admitting: *Deleted

## 2015-05-16 ENCOUNTER — Encounter (HOSPITAL_BASED_OUTPATIENT_CLINIC_OR_DEPARTMENT_OTHER)
Admission: RE | Admit: 2015-05-16 | Discharge: 2015-05-16 | Disposition: A | Discharge status: Home / Self Care | Payer: Medicaid Other | Source: Ambulatory Visit | Attending: General Surgery | Admitting: General Surgery

## 2015-05-16 ENCOUNTER — Other Ambulatory Visit: Payer: Self-pay

## 2015-05-16 ENCOUNTER — Ambulatory Visit
Admission: RE | Admit: 2015-05-16 | Discharge: 2015-05-16 | Disposition: A | Discharge status: Home / Self Care | Payer: Medicaid Other | Source: Ambulatory Visit | Attending: General Surgery | Admitting: General Surgery

## 2015-05-16 DIAGNOSIS — D241 Benign neoplasm of right breast: Secondary | ICD-10-CM | POA: Diagnosis present

## 2015-05-16 DIAGNOSIS — K5190 Ulcerative colitis, unspecified, without complications: Secondary | ICD-10-CM | POA: Diagnosis not present

## 2015-05-16 DIAGNOSIS — Z01818 Encounter for other preprocedural examination: Secondary | ICD-10-CM | POA: Diagnosis present

## 2015-05-16 DIAGNOSIS — Z8541 Personal history of malignant neoplasm of cervix uteri: Secondary | ICD-10-CM | POA: Diagnosis not present

## 2015-05-16 DIAGNOSIS — I10 Essential (primary) hypertension: Secondary | ICD-10-CM | POA: Diagnosis not present

## 2015-05-16 DIAGNOSIS — M797 Fibromyalgia: Secondary | ICD-10-CM | POA: Diagnosis not present

## 2015-05-16 DIAGNOSIS — Z8711 Personal history of peptic ulcer disease: Secondary | ICD-10-CM | POA: Diagnosis not present

## 2015-05-16 DIAGNOSIS — Z87891 Personal history of nicotine dependence: Secondary | ICD-10-CM | POA: Diagnosis not present

## 2015-05-16 DIAGNOSIS — F419 Anxiety disorder, unspecified: Secondary | ICD-10-CM | POA: Diagnosis not present

## 2015-05-16 DIAGNOSIS — Z79899 Other long term (current) drug therapy: Secondary | ICD-10-CM | POA: Diagnosis not present

## 2015-05-16 DIAGNOSIS — K219 Gastro-esophageal reflux disease without esophagitis: Secondary | ICD-10-CM | POA: Diagnosis not present

## 2015-05-16 DIAGNOSIS — M549 Dorsalgia, unspecified: Secondary | ICD-10-CM | POA: Diagnosis not present

## 2015-05-16 DIAGNOSIS — F329 Major depressive disorder, single episode, unspecified: Secondary | ICD-10-CM | POA: Diagnosis not present

## 2015-05-16 DIAGNOSIS — M1990 Unspecified osteoarthritis, unspecified site: Secondary | ICD-10-CM | POA: Diagnosis not present

## 2015-05-16 LAB — COMPREHENSIVE METABOLIC PANEL
ALBUMIN: 3.7 g/dL (ref 3.5–5.0)
ALT: 11 U/L — ABNORMAL LOW (ref 14–54)
ANION GAP: 8 (ref 5–15)
AST: 16 U/L (ref 15–41)
Alkaline Phosphatase: 61 U/L (ref 38–126)
BUN: 7 mg/dL (ref 6–20)
CO2: 29 mmol/L (ref 22–32)
Calcium: 9.3 mg/dL (ref 8.9–10.3)
Chloride: 102 mmol/L (ref 101–111)
Creatinine, Ser: 0.61 mg/dL (ref 0.44–1.00)
GFR calc Af Amer: >60 mL/min (ref >60)
GFR calc non Af Amer: >60 mL/min (ref >60)
GLUCOSE: 73 mg/dL (ref 65–99)
POTASSIUM: 4.3 mmol/L (ref 3.5–5.1)
SODIUM: 139 mmol/L (ref 135–145)
Total Bilirubin: <0.1 mg/dL — ABNORMAL LOW (ref 0.3–1.2)
Total Protein: 7.3 g/dL (ref 6.5–8.1)

## 2015-05-16 LAB — CBC WITH DIFFERENTIAL/PLATELET
BASOS PCT: 0 % (ref 0–1)
Basophils Absolute: 0 10*3/uL (ref 0.0–0.1)
EOS ABS: 0.1 10*3/uL (ref 0.0–0.7)
Eosinophils Relative: 2 % (ref 0–5)
HCT: 48 % — ABNORMAL HIGH (ref 36.0–46.0)
Hemoglobin: 16.2 g/dL — ABNORMAL HIGH (ref 12.0–15.0)
Lymphocytes Relative: 26 % (ref 12–46)
Lymphs Abs: 1.5 10*3/uL (ref 0.7–4.0)
MCH: 34.7 pg — ABNORMAL HIGH (ref 26.0–34.0)
MCHC: 33.8 g/dL (ref 30.0–36.0)
MCV: 102.8 fL — ABNORMAL HIGH (ref 78.0–100.0)
MONO ABS: 0.6 10*3/uL (ref 0.1–1.0)
MONOS PCT: 11 % (ref 3–12)
NEUTROS PCT: 61 % (ref 43–77)
Neutro Abs: 3.6 10*3/uL (ref 1.7–7.7)
PLATELETS: 324 10*3/uL (ref 150–400)
RBC: 4.67 MIL/uL (ref 3.87–5.11)
RDW: 13 % (ref 11.5–15.5)
WBC: 5.9 10*3/uL (ref 4.0–10.5)

## 2015-05-18 NOTE — H&P (Signed)
[name]  Location: [facility] Patient #: [id number] DOB: 20-Sep-1966 Single / Language: [name] / Race: White Female       History of Present Illness  Patient words: fibroadenoma.  The patient is a 49 year old female who presents with a complaint of fibroadenoma right breast. This is a 49 year old Caucasian female from [place]. Referred by her PCP Dr. Celedonio Savage for evaluation and management of a tender fibroadenoma in the right breast at the 8 o'clock position.  The patient has a history of a right breast cyst aspirated many years ago but otherwise no breast problems. She gives an eight-month history of small tender lump in the right breast. The pain is very bothersome to her. Recent mammograms and ultrasound performed by Dr. Margarette Canada  reveal a 1.2 cm solid mass at the 8 o'clock position of the right breast, 4 cm from the nipple. Image guided biopsy shows fibroadenoma. She would like this area excised  Past history is significant for visit to the emergency department on March 13, 2015 for pain in the right chest and shoulder. DVT and MI was ruled out. She has seen Haydee Salter as well. She has been told that her chest pain and shoulder pain are probably not due to her breast. I agree. Past history significant for breast cyst aspiration, vaginal hysterectomy for abnormal Pap smear but with benign findings. Hypertension. Anxiety and depression. Fibromyalgia. Irritable bowel syndrome. Explored for repair perforated ulcer 1991. Tobacco abuse one half packs per day. She is single. Has 1 child. Currently unemployed. At her request, she will be scheduled for excision of right breast fibroadenoma with radioactive seed localization in the near future. I discussed the indications, details, techniques, and numerous risk of the surgery with her. She is aware of the risk of bleeding, infection, cosmetic deformity, skin necrosis secondary to smoking,  reoperation if we are surprised to find cancer. She understands all of these issues. All of her questions were answered. She agrees with this plan.   Other Problems  Anxiety Disorder Arthritis Back Pain Cervical Cancer Depression Gastric Ulcer Gastroesophageal Reflux Disease High blood pressure Lump In Breast Transfusion history Ulcerative Colitis  Past Surgical History  Breast Biopsy Right. Colon Polyp Removal - Colonoscopy Hysterectomy (due to cancer) - Partial Resection of Stomach  Diagnostic Studies History  Colonoscopy 1-5 years ago Mammogram within last year Pap Smear 1-5 years ago  Allergies  Sulfa 10 *OPHTHALMIC AGENTS*  Medication History  TraMADol HCl (50MG  Tablet, Oral) Active. AmLODIPine Besylate (10MG  Tablet, Oral) Active. Omeprazole (20MG  Capsule DR, Oral) Active. Promethazine HCl (25MG  Tablet, Oral) Active. Torsemide (10MG  Tablet, Oral) Active. Naproxen DR (500MG  Tablet DR, Oral) Active. Tylenol Extra Strength (500MG  Tablet, Oral as needed) Active. Medications Reconciled  Social History  No alcohol use No drug use Tobacco use Former smoker.  Family History  Alcohol Abuse Father. Arthritis Mother, Sister. Colon Polyps Father. Diabetes Mellitus Father. Heart Disease Father. Hypertension Brother, Father, Mother, Sister. Melanoma Mother.  Pregnancy / Birth History  Gravida 2 Maternal age 20-40 Para 1  Review of Systems  General Present- Weight Gain. Not Present- Appetite Loss, Chills, Fatigue, Fever, Night Sweats and Weight Loss. Skin Not Present- Change in Wart/Mole, Dryness, Hives, Jaundice, New Lesions, Non-Healing Wounds, Rash and Ulcer. HEENT Not Present- Earache, Hearing Loss, Hoarseness, Nose Bleed, Oral Ulcers, Ringing in the Ears, Seasonal Allergies, Sinus Pain, Sore Throat, Visual Disturbances, Wears glasses/contact lenses and Yellow Eyes. Breast Present- Breast Mass and Breast Pain. Not  Present- Nipple Discharge and Skin Changes. Cardiovascular Not Present- Chest Pain, Difficulty Breathing Lying Down, Leg Cramps, Palpitations, Rapid Heart Rate, Shortness of Breath and Swelling of Extremities. Gastrointestinal Not Present- Abdominal Pain, Bloating, Bloody Stool, Change in Bowel Habits, Chronic diarrhea, Constipation, Difficulty Swallowing, Excessive gas, Gets full quickly at meals, Hemorrhoids, Indigestion, Nausea, Rectal Pain and Vomiting. Female Genitourinary Not Present- Frequency, Nocturia, Painful Urination, Pelvic Pain and Urgency. Musculoskeletal Present- Back Pain, Joint Pain and Joint Stiffness. Not Present- Muscle Pain, Muscle Weakness and Swelling of Extremities. Neurological Not Present- Decreased Memory, Fainting, Headaches, Numbness, Seizures, Tingling, Tremor, Trouble walking and Weakness. Psychiatric Present- Depression. Not Present- Anxiety, Bipolar, Change in Sleep Pattern, Fearful and Frequent crying. Endocrine Not Present- Cold Intolerance, Excessive Hunger, Hair Changes, Heat Intolerance, Hot flashes and New Diabetes. Hematology Present- Easy Bruising. Not Present- Excessive bleeding, Gland problems, HIV and Persistent Infections.   Vitals  Weight: 111 lb Height: 64in Body Surface Area: 1.51 m Body Mass Index: 19.05 kg/m Temp.: 98.30F(Oral)  Pulse: 82 (Regular)  Resp.: 17 (Unlabored)  BP: 126/70 (Sitting, Left Arm, Standard)    Physical Exam  General Mental Status-Alert. General Appearance-Consistent with stated age. Hydration-Well hydrated. Voice-Normal.  Integumentary Note: Intense total-body tanning.   Head and Neck Head-normocephalic, atraumatic with no lesions or palpable masses. Trachea-midline. Thyroid Gland Characteristics - normal size and consistency.  Eye Eyeball - Bilateral-Extraocular movements intact. Sclera/Conjunctiva - Bilateral-No scleral icterus.  Chest and Lung Exam Chest and lung  exam reveals -quiet, even and easy respiratory effort with no use of accessory muscles and on auscultation, normal breath sounds, no adventitious sounds and normal vocal resonance. Inspection Chest Wall - Normal. Back - normal.  Breast Note: Both breasts are medium size. Soft. Skin very tanned throughout. Slightly lumpy everywhere. 1 cm tender rubbery smooth mass at the 8 o'clock position, consistent with imaging findings. Very tender. No axillary adenopathy. No other dominant masses.   Cardiovascular Cardiovascular examination reveals -normal heart sounds, regular rate and rhythm with no murmurs and normal pedal pulses bilaterally.  Abdomen Note: Soft. Scaphoid. Nontender. Well-healed right subcostal scar. No mass or hernia.   Neurologic Neurologic evaluation reveals -alert and oriented x 3 with no impairment of recent or remote memory. Mental Status-Normal.  Musculoskeletal Normal Exam - Left-Upper Extremity Strength Normal and Lower Extremity Strength Normal. Normal Exam - Right-Upper Extremity Strength Normal and Lower Extremity Strength Normal.  Lymphatic Head & Neck  General Head & Neck Lymphatics: Bilateral - Description - Normal. Axillary  General Axillary Region: Bilateral - Description - Normal. Tenderness - Non Tender. Femoral & Inguinal  Generalized Femoral & Inguinal Lymphatics: Bilateral - Description - Normal. Tenderness - Non Tender.    Assessment & Plan BREAST MASS, RIGHT (611.72  N63)   Schedule for Surgery You have undergone an image guided biopsy of the tender lump in your right breast. The pathology report shows a benign fibroadenoma We have discussed your options, and you have stated you would like to have this area excised. You will be scheduled for a conservative right breast lumpectomy with radioactive seed localization. We have discussed the indications, techniques, and numerous risk of the surgery in detail Please read the  printed information that we have given you.  Pt Education - CSS Breast Biopsy Instructions (FLB): discussed with patient and provided information.  HYPERTENSION, BENIGN (401.1  I10) ANXIETY AND DEPRESSION (300.00  F41.9) HISTORY OF DUODENAL ULCER (V12.79  Z87.19) Impression: History perforated ulcer 1991 TOBACCO ABUSE (305.1  Z72.0) Impression: 1-1/2 packs per  day. Encouraged to quit and seek counseling. HISTORY OF VAGINAL HYSTERECTOMY (V88.01  Z90.710) Impression: Abnormal Pap. No cancer. FIBROMYALGIA (729.1  M79.7)   ,Edsel Petrin. Dalbert Batman, M.D., [facility] General and Minimally invasive Surgery Breast and Colorectal Surgery Office:   [phone] Pager:   [phone]

## 2015-05-19 ENCOUNTER — Encounter (HOSPITAL_BASED_OUTPATIENT_CLINIC_OR_DEPARTMENT_OTHER)
Admission: RE | Disposition: A | Discharge status: Home / Self Care | Payer: Self-pay | Source: Ambulatory Visit | Attending: General Surgery

## 2015-05-19 ENCOUNTER — Encounter (HOSPITAL_BASED_OUTPATIENT_CLINIC_OR_DEPARTMENT_OTHER): Payer: Self-pay | Admitting: *Deleted

## 2015-05-19 ENCOUNTER — Ambulatory Visit (HOSPITAL_BASED_OUTPATIENT_CLINIC_OR_DEPARTMENT_OTHER)
Admission: RE | Admit: 2015-05-19 | Discharge: 2015-05-19 | Disposition: A | Discharge status: Home / Self Care | Payer: Medicaid Other | Source: Ambulatory Visit | Attending: General Surgery | Admitting: General Surgery

## 2015-05-19 ENCOUNTER — Ambulatory Visit
Admission: RE | Admit: 2015-05-19 | Discharge: 2015-05-19 | Disposition: A | Discharge status: Home / Self Care | Payer: Medicaid Other | Source: Ambulatory Visit | Attending: General Surgery | Admitting: General Surgery

## 2015-05-19 ENCOUNTER — Ambulatory Visit (HOSPITAL_BASED_OUTPATIENT_CLINIC_OR_DEPARTMENT_OTHER): Payer: Medicaid Other | Admitting: Anesthesiology

## 2015-05-19 DIAGNOSIS — K5190 Ulcerative colitis, unspecified, without complications: Secondary | ICD-10-CM | POA: Insufficient documentation

## 2015-05-19 DIAGNOSIS — M1990 Unspecified osteoarthritis, unspecified site: Secondary | ICD-10-CM | POA: Insufficient documentation

## 2015-05-19 DIAGNOSIS — Z8711 Personal history of peptic ulcer disease: Secondary | ICD-10-CM | POA: Insufficient documentation

## 2015-05-19 DIAGNOSIS — D241 Benign neoplasm of right breast: Secondary | ICD-10-CM | POA: Diagnosis not present

## 2015-05-19 DIAGNOSIS — K219 Gastro-esophageal reflux disease without esophagitis: Secondary | ICD-10-CM | POA: Insufficient documentation

## 2015-05-19 DIAGNOSIS — F419 Anxiety disorder, unspecified: Secondary | ICD-10-CM | POA: Diagnosis not present

## 2015-05-19 DIAGNOSIS — F329 Major depressive disorder, single episode, unspecified: Secondary | ICD-10-CM | POA: Insufficient documentation

## 2015-05-19 DIAGNOSIS — I10 Essential (primary) hypertension: Secondary | ICD-10-CM | POA: Diagnosis not present

## 2015-05-19 DIAGNOSIS — M797 Fibromyalgia: Secondary | ICD-10-CM | POA: Insufficient documentation

## 2015-05-19 DIAGNOSIS — Z87891 Personal history of nicotine dependence: Secondary | ICD-10-CM | POA: Insufficient documentation

## 2015-05-19 DIAGNOSIS — M549 Dorsalgia, unspecified: Secondary | ICD-10-CM | POA: Insufficient documentation

## 2015-05-19 DIAGNOSIS — Z79899 Other long term (current) drug therapy: Secondary | ICD-10-CM | POA: Insufficient documentation

## 2015-05-19 DIAGNOSIS — Z8541 Personal history of malignant neoplasm of cervix uteri: Secondary | ICD-10-CM | POA: Insufficient documentation

## 2015-05-19 HISTORY — PX: RADIOACTIVE SEED GUIDED EXCISIONAL BREAST BIOPSY: SHX6490

## 2015-05-19 HISTORY — DX: Fibromyalgia: M79.7

## 2015-05-19 LAB — POCT HEMOGLOBIN-HEMACUE: HEMOGLOBIN: 17.6 g/dL — AB (ref 12.0–15.0)

## 2015-05-19 SURGERY — RADIOACTIVE SEED GUIDED BREAST BIOPSY
Anesthesia: General | Site: Breast | Laterality: Right

## 2015-05-19 MED ORDER — SCOPOLAMINE 1 MG/3DAYS TD PT72: 1.0000 | MEDICATED_PATCH | Freq: Once | TRANSDERMAL | Status: DC | PRN

## 2015-05-19 MED ORDER — HYDROMORPHONE HCL 1 MG/ML IJ SOLN
0.2500 mg | INTRAMUSCULAR | Status: DC | PRN
  Administered 2015-05-19 (×4): 0.5 mg via INTRAVENOUS

## 2015-05-19 MED ORDER — OXYCODONE HCL 5 MG/5ML PO SOLN: 5.0000 mg | Freq: Once | ORAL | Status: AC | PRN

## 2015-05-19 MED ORDER — OXYCODONE HCL 5 MG PO TABS
ORAL_TABLET | ORAL | Status: AC
  Filled 2015-05-19: qty 1

## 2015-05-19 MED ORDER — CEFAZOLIN SODIUM-DEXTROSE 2-3 GM-% IV SOLR
INTRAVENOUS | Status: AC
  Filled 2015-05-19: qty 50

## 2015-05-19 MED ORDER — FENTANYL CITRATE (PF) 100 MCG/2ML IJ SOLN
INTRAMUSCULAR | Status: AC
  Filled 2015-05-19: qty 6

## 2015-05-19 MED ORDER — CHLORHEXIDINE GLUCONATE 4 % EX LIQD: 1.0000 "application " | Freq: Once | CUTANEOUS | Status: DC

## 2015-05-19 MED ORDER — MIDAZOLAM HCL 2 MG/2ML IJ SOLN
1.0000 mg | INTRAMUSCULAR | Status: DC | PRN
  Administered 2015-05-19: 2 mg via INTRAVENOUS

## 2015-05-19 MED ORDER — CEFAZOLIN SODIUM-DEXTROSE 2-3 GM-% IV SOLR
2.0000 g | INTRAVENOUS | Status: AC
  Administered 2015-05-19: 2 g via INTRAVENOUS

## 2015-05-19 MED ORDER — OXYCODONE HCL 5 MG PO TABS
5.0000 mg | ORAL_TABLET | Freq: Once | ORAL | Status: AC | PRN
  Administered 2015-05-19: 5 mg via ORAL

## 2015-05-19 MED ORDER — FENTANYL CITRATE (PF) 100 MCG/2ML IJ SOLN
50.0000 ug | INTRAMUSCULAR | Status: DC | PRN
  Administered 2015-05-19: 100 ug via INTRAVENOUS

## 2015-05-19 MED ORDER — PROPOFOL 10 MG/ML IV BOLUS
INTRAVENOUS | Status: DC | PRN
  Administered 2015-05-19: 200 mg via INTRAVENOUS

## 2015-05-19 MED ORDER — ONDANSETRON HCL 4 MG/2ML IJ SOLN
INTRAMUSCULAR | Status: DC | PRN
  Administered 2015-05-19: 4 mg via INTRAVENOUS

## 2015-05-19 MED ORDER — BUPIVACAINE-EPINEPHRINE (PF) 0.5% -1:200000 IJ SOLN
INTRAMUSCULAR | Status: AC
  Filled 2015-05-19: qty 30

## 2015-05-19 MED ORDER — LACTATED RINGERS IV SOLN
INTRAVENOUS | Status: DC
  Administered 2015-05-19 (×2): via INTRAVENOUS

## 2015-05-19 MED ORDER — HYDROMORPHONE HCL 1 MG/ML IJ SOLN
INTRAMUSCULAR | Status: AC
  Filled 2015-05-19: qty 1

## 2015-05-19 MED ORDER — LIDOCAINE HCL (CARDIAC) 20 MG/ML IV SOLN
INTRAVENOUS | Status: DC | PRN
  Administered 2015-05-19: 50 mg via INTRAVENOUS

## 2015-05-19 MED ORDER — HYDROCODONE-ACETAMINOPHEN 5-325 MG PO TABS: 1.0000 | ORAL_TABLET | Freq: Four times a day (QID) | ORAL | Status: DC | PRN

## 2015-05-19 MED ORDER — MIDAZOLAM HCL 2 MG/2ML IJ SOLN
INTRAMUSCULAR | Status: AC
  Filled 2015-05-19: qty 2

## 2015-05-19 MED ORDER — BUPIVACAINE-EPINEPHRINE 0.5% -1:200000 IJ SOLN
INTRAMUSCULAR | Status: DC | PRN
  Administered 2015-05-19: 5 mL

## 2015-05-19 MED ORDER — GLYCOPYRROLATE 0.2 MG/ML IJ SOLN: 0.2000 mg | Freq: Once | INTRAMUSCULAR | Status: DC | PRN

## 2015-05-19 MED ORDER — DEXAMETHASONE SODIUM PHOSPHATE 4 MG/ML IJ SOLN
INTRAMUSCULAR | Status: DC | PRN
  Administered 2015-05-19: 10 mg via INTRAVENOUS

## 2015-05-19 MED ORDER — MEPERIDINE HCL 25 MG/ML IJ SOLN: 6.2500 mg | INTRAMUSCULAR | Status: DC | PRN

## 2015-05-19 SURGICAL SUPPLY — 64 items
APPLIER CLIP 9.375 MED OPEN (MISCELLANEOUS) ×3
BENZOIN TINCTURE PRP APPL 2/3 (GAUZE/BANDAGES/DRESSINGS) IMPLANT
BINDER BREAST LRG (GAUZE/BANDAGES/DRESSINGS) IMPLANT
BINDER BREAST MEDIUM (GAUZE/BANDAGES/DRESSINGS) IMPLANT
BINDER BREAST XLRG (GAUZE/BANDAGES/DRESSINGS) IMPLANT
BINDER BREAST XXLRG (GAUZE/BANDAGES/DRESSINGS) IMPLANT
BLADE HEX COATED 2.75 (ELECTRODE) ×3 IMPLANT
BLADE SURG 10 STRL SS (BLADE) IMPLANT
BLADE SURG 15 STRL LF DISP TIS (BLADE) ×1 IMPLANT
BLADE SURG 15 STRL SS (BLADE) ×2
CANISTER SUC SOCK COL 7IN (MISCELLANEOUS) IMPLANT
CANISTER SUCT 1200ML W/VALVE (MISCELLANEOUS) ×3 IMPLANT
CHLORAPREP W/TINT 26ML (MISCELLANEOUS) ×3 IMPLANT
CLIP APPLIE 9.375 MED OPEN (MISCELLANEOUS) ×1 IMPLANT
CLOSURE WOUND 1/2 X4 (GAUZE/BANDAGES/DRESSINGS)
COVER BACK TABLE 60X90IN (DRAPES) ×3 IMPLANT
COVER MAYO STAND STRL (DRAPES) ×3 IMPLANT
COVER PROBE W GEL 5X96 (DRAPES) ×3 IMPLANT
DECANTER SPIKE VIAL GLASS SM (MISCELLANEOUS) IMPLANT
DERMABOND ADVANCED (GAUZE/BANDAGES/DRESSINGS) ×2
DERMABOND ADVANCED .7 DNX12 (GAUZE/BANDAGES/DRESSINGS) ×1 IMPLANT
DEVICE DUBIN W/COMP PLATE 8390 (MISCELLANEOUS) ×3 IMPLANT
DRAIN CHANNEL 19F RND (DRAIN) IMPLANT
DRAPE LAPAROSCOPIC ABDOMINAL (DRAPES) ×3 IMPLANT
DRAPE UTILITY XL STRL (DRAPES) ×3 IMPLANT
DRSG PAD ABDOMINAL 8X10 ST (GAUZE/BANDAGES/DRESSINGS) IMPLANT
ELECT REM PT RETURN 9FT ADLT (ELECTROSURGICAL) ×3
ELECTRODE REM PT RTRN 9FT ADLT (ELECTROSURGICAL) ×1 IMPLANT
EVACUATOR SILICONE 100CC (DRAIN) IMPLANT
GLOVE BIOGEL PI IND STRL 7.0 (GLOVE) ×1 IMPLANT
GLOVE BIOGEL PI INDICATOR 7.0 (GLOVE) ×2
GLOVE ECLIPSE 6.5 STRL STRAW (GLOVE) ×3 IMPLANT
GLOVE EUDERMIC 7 POWDERFREE (GLOVE) ×3 IMPLANT
GLOVE EXAM NITRILE EXT CUFF MD (GLOVE) ×3 IMPLANT
GOWN STRL REUS W/ TWL LRG LVL3 (GOWN DISPOSABLE) ×1 IMPLANT
GOWN STRL REUS W/ TWL XL LVL3 (GOWN DISPOSABLE) ×1 IMPLANT
GOWN STRL REUS W/TWL LRG LVL3 (GOWN DISPOSABLE) ×2
GOWN STRL REUS W/TWL XL LVL3 (GOWN DISPOSABLE) ×2
KIT MARKER MARGIN INK (KITS) ×3 IMPLANT
NEEDLE HYPO 25X1 1.5 SAFETY (NEEDLE) ×3 IMPLANT
NS IRRIG 1000ML POUR BTL (IV SOLUTION) ×3 IMPLANT
PACK BASIN DAY SURGERY FS (CUSTOM PROCEDURE TRAY) ×3 IMPLANT
PENCIL BUTTON HOLSTER BLD 10FT (ELECTRODE) ×3 IMPLANT
PIN SAFETY STERILE (MISCELLANEOUS) ×3 IMPLANT
SHEET MEDIUM DRAPE 40X70 STRL (DRAPES) IMPLANT
SLEEVE SCD COMPRESS KNEE MED (MISCELLANEOUS) ×3 IMPLANT
SPONGE GAUZE 4X4 12PLY STER LF (GAUZE/BANDAGES/DRESSINGS) IMPLANT
SPONGE LAP 18X18 X RAY DECT (DISPOSABLE) IMPLANT
SPONGE LAP 4X18 X RAY DECT (DISPOSABLE) ×3 IMPLANT
STRIP CLOSURE SKIN 1/2X4 (GAUZE/BANDAGES/DRESSINGS) IMPLANT
SUT ETHILON 3 0 FSL (SUTURE) IMPLANT
SUT MNCRL AB 4-0 PS2 18 (SUTURE) ×3 IMPLANT
SUT SILK 2 0 SH (SUTURE) ×3 IMPLANT
SUT VIC AB 2-0 CT1 27 (SUTURE)
SUT VIC AB 2-0 CT1 TAPERPNT 27 (SUTURE) IMPLANT
SUT VIC AB 3-0 SH 27 (SUTURE)
SUT VIC AB 3-0 SH 27X BRD (SUTURE) IMPLANT
SUT VICRYL 3-0 CR8 SH (SUTURE) ×3 IMPLANT
SYRINGE 10CC LL (SYRINGE) IMPLANT
TOWEL OR 17X24 6PK STRL BLUE (TOWEL DISPOSABLE) ×3 IMPLANT
TOWEL OR NON WOVEN STRL DISP B (DISPOSABLE) IMPLANT
TUBE CONNECTING 20'X1/4 (TUBING) ×1
TUBE CONNECTING 20X1/4 (TUBING) ×2 IMPLANT
YANKAUER SUCT BULB TIP NO VENT (SUCTIONS) ×6 IMPLANT

## 2015-05-19 NOTE — Transfer of Care (Signed)
Immediate Anesthesia Transfer of Care Note  Patient: [name]  Procedure(s) Performed: Procedure(s): EXCISIONAL RIGHT BREAST MASS WITH RADIOACTIVE SEED LOCALIZATION (Right)  Patient Location: PACU  Anesthesia Type:General  Level of Consciousness: sedated  Airway & Oxygen Therapy: Patient Spontanous Breathing and Patient connected to face mask oxygen  Post-op Assessment: Report given to RN  Post vital signs: Reviewed and stable  Last Vitals:  Filed Vitals:   05/19/15 0649  BP: 120/84  Pulse: 78  Temp: 36.8 C  Resp: 16    Complications: No apparent anesthesia complications

## 2015-05-19 NOTE — Anesthesia Preprocedure Evaluation (Signed)
Anesthesia Evaluation  Patient identified by MRN, date of birth, ID band Patient awake    Reviewed: Allergy & Precautions, NPO status , Patient's Chart, lab work & pertinent test results  Airway Mallampati: I  TM Distance: >3 FB Neck ROM: Full    Dental  (+) Teeth Intact, Dental Advisory Given   Pulmonary Current Smoker,  breath sounds clear to auscultation        Cardiovascular hypertension, Pt. on medications Rhythm:Regular Rate:Normal     Neuro/Psych    GI/Hepatic GERD-  Medicated and Controlled,  Endo/Other    Renal/GU      Musculoskeletal   Abdominal   Peds  Hematology   Anesthesia Other Findings   Reproductive/Obstetrics                             Anesthesia Physical Anesthesia Plan  ASA: II  Anesthesia Plan: General   Post-op Pain Management:    Induction: Intravenous  Airway Management Planned: LMA  Additional Equipment:   Intra-op Plan:   Post-operative Plan: Extubation in OR  Informed Consent: I have reviewed the patients History and Physical, chart, labs and discussed the procedure including the risks, benefits and alternatives for the proposed anesthesia with the patient or authorized representative who has indicated his/her understanding and acceptance.   Dental advisory given  Plan Discussed with: CRNA, Anesthesiologist and Surgeon  Anesthesia Plan Comments:         Anesthesia Quick Evaluation

## 2015-05-19 NOTE — Anesthesia Procedure Notes (Signed)
Procedure Name: LMA Insertion Date/Time: 05/19/2015 8:26 AM Performed by: Melynda Ripple D Pre-anesthesia Checklist: Patient identified, Emergency Drugs available, Suction available and Patient being monitored Patient Re-evaluated:Patient Re-evaluated prior to inductionOxygen Delivery Method: Circle System Utilized Preoxygenation: Pre-oxygenation with 100% oxygen Intubation Type: IV induction Ventilation: Mask ventilation without difficulty LMA: LMA inserted LMA Size: 3.0 Number of attempts: 1 Airway Equipment and Method: Bite block Placement Confirmation: positive ETCO2 Tube secured with: Tape Dental Injury: Teeth and Oropharynx as per pre-operative assessment

## 2015-05-19 NOTE — Anesthesia Postprocedure Evaluation (Signed)
  Anesthesia Johns-op Note  Patient: [name]  Procedure(s) Performed: Procedure(s): EXCISIONAL RIGHT BREAST MASS WITH RADIOACTIVE SEED LOCALIZATION (Right)  Patient Location: PACU  Anesthesia Type: General   Level of Consciousness: awake, alert  and oriented  Airway and Oxygen Therapy: Patient Spontanous Breathing  Johns-op Pain: mild  Johns-op Assessment: Johns-op Vital signs reviewed  Johns-op Vital Signs: Reviewed  Last Vitals:  Filed Vitals:   05/19/15 1015  BP: 120/74  Pulse: 91  Temp:   Resp: 17    Complications: No apparent anesthesia complications

## 2015-05-19 NOTE — Op Note (Signed)
  Patient Name:           [name]   Date of Surgery:        05/19/2015  Pre op Diagnosis:      Fibroadenoma right breast, 8:00 position  Post op Diagnosis:    Same  Procedure:                 Right breast lumpectomy with radioactive seed localization and margin assessment  Surgeon:                     Edsel Petrin. Dalbert Batman, M.D., FACS  Assistant:                      OR staff  Operative Indications:       This is a 49 year old Caucasian female from [place]. Referred by her PCP Dr. Celedonio Savage for evaluation and management of a tender fibroadenoma in the right breast at the 8 o'clock position. The patient has a history of a right breast cyst aspirated many years ago but otherwise no breast problems. She gives an eight-month history of small tender lump in the right breast. The pain is very bothersome to her. Recent mammograms and ultrasound performed by Dr. Margarette Canada reveal a 1.2 cm solid mass at the 8 o'clock position of the right breast, 4 cm from the nipple. Image guided biopsy shows fibroadenoma. She would like this area excised At her request, she will be scheduled for excision of right breast fibroadenoma with radioactive seed localization in the near future.   Operative Findings:       There was a smooth, well marginated, rubbery mass identified by palpation in the right breast at the 8:00 position.  The radioactive seed  and the biopsy marker clip were adjacent to the mass.  I could actually see the radioactive seed embedded in the fibroadenoma at the time of surgery.  The specimen mammogram confirmed that the marker clip and the seed were in the specimen.  The specimen was marked with sutures to orient the pathologist.  Procedure in Detail:          In the holding area I could identify the radioactive signal with the neoprobe in the right breast at the 8:00 position.  The patient was taken to the operating room and underwent general LMA anesthesia.  Surgical timeout was  performed.  The right breast was prepped and draped in a sterile fashion.  Intravenous antibiotics were given.  0.5% Marcaine with epinephrine was used as local infiltration anesthetic.    Using the neoprobe I identified the point of maximum radioactivity in the lower outer quadrant.  Curvilinear incision was made in this position.  Dissection was carried down to the breast tissue around the fibroadenoma and the radioactive seed.  The specimen was marked with silk sutures to orient the pathologist.  Specimen mammogram looked good as described above.  The specimen was sent to the lab.  Hemostasis was excellent and achieved electrocautery.  After irrigating the wound and closed the breast tissue with 3-0 Vicryl sutures and the skin with a running subcuticular 4-0 Monocryl and Dermabond.  Patient tolerated the procedure well was taken to PACU in stable condition.  EBL less than 10 mL.  Counts correct.  Complications none.     Edsel Petrin. Dalbert Batman, M.D., FACS General and Minimally Invasive Surgery Breast and Colorectal Surgery  05/19/2015 9:12 AM

## 2015-05-19 NOTE — Interval H&P Note (Signed)
History and Physical Interval Note:  05/19/2015 7:11 AM  [name]  has presented today for surgery, with the diagnosis of fiboadonoma right breast  The various methods of treatment have been discussed with the patient and family. After consideration of risks, benefits and other options for treatment, the patient has consented to  Procedure(s): EXCISIONAL RIGHT BREAST MASS WITH RADIOACTIVE SEED LOCALIZATION (Right) as a surgical intervention .  The patient's history has been reviewed, patient examined, no change in status, stable for surgery.  I have reviewed the patient's chart and labs.  Questions were answered to the patient's satisfaction.     Adin Hector

## 2015-05-19 NOTE — Discharge Instructions (Signed)
[facility] °Office Phone Number [phone] ° °BREAST BIOPSY/ PARTIAL MASTECTOMY: POST OP INSTRUCTIONS ° °Always review your discharge instruction sheet given to you by the facility where your surgery was performed. ° °IF YOU HAVE DISABILITY OR FAMILY LEAVE FORMS, YOU MUST BRING THEM TO THE OFFICE FOR PROCESSING.  DO NOT GIVE THEM TO YOUR DOCTOR. ° °1. A prescription for pain medication may be given to you upon discharge.  Take your pain medication as prescribed, if needed.  If narcotic pain medicine is not needed, then you may take acetaminophen (Tylenol) or ibuprofen (Advil) as needed. °2. Take your usually prescribed medications unless otherwise directed °3. If you need a refill on your pain medication, please contact your pharmacy.  They will contact our office to request authorization.  Prescriptions will not be filled after 5pm or on week-ends. °4. You should eat very light the first 24 hours after surgery, such as soup, crackers, pudding, etc.  Resume your normal diet the day after surgery. °5. Most patients will experience some swelling and bruising in the breast.  Ice packs and a good support bra will help.  Swelling and bruising can take several days to resolve.  °6. It is common to experience some constipation if taking pain medication after surgery.  Increasing fluid intake and taking a stool softener will usually help or prevent this problem from occurring.  A mild laxative (Milk of Magnesia or Miralax) should be taken according to package directions if there are no bowel movements after 48 hours. °7. Unless discharge instructions indicate otherwise, you may remove your bandages 24-48 hours after surgery, and you may shower at that time.  You may have steri-strips (small skin tapes) in place directly over the incision.  These strips should be left on the skin for 7-10 days.  If your surgeon used skin glue on the incision, you may shower in 24 hours.  The glue will flake off over the  next 2-3 weeks.  Any sutures or staples will be removed at the office during your follow-up visit. °8. ACTIVITIES:  You may resume regular daily activities (gradually increasing) beginning the next day.  Wearing a good support bra or sports bra minimizes pain and swelling.  You may have sexual intercourse when it is comfortable. °a. You may drive when you no longer are taking prescription pain medication, you can comfortably wear a seatbelt, and you can safely maneuver your car and apply brakes. °b. RETURN TO WORK:  ______________________________________________________________________________________ °9. You should see your doctor in the office for a follow-up appointment approximately two weeks after your surgery.  Your doctor’s nurse will typically make your follow-up appointment when she calls you with your pathology report.  Expect your pathology report 2-3 business days after your surgery.  You may call to check if you do not hear from us after three days. °10. OTHER INSTRUCTIONS: _______________________________________________________________________________________________ _____________________________________________________________________________________________________________________________________ °_____________________________________________________________________________________________________________________________________ °_____________________________________________________________________________________________________________________________________ ° °WHEN TO CALL YOUR DOCTOR: °1. Fever over 101.0 °2. Nausea and/or vomiting. °3. Extreme swelling or bruising. °4. Continued bleeding from incision. °5. Increased pain, redness, or drainage from the incision. ° °The clinic staff is available to answer your questions during regular business hours.  Please don’t hesitate to call and ask to speak to one of the nurses for clinical concerns.  If you have a medical emergency, go to the nearest  emergency room or call 911.  A surgeon from [facility] is always on call at the hospital. ° °For further questions, please visit [email or web address]  ° ° ° °  Post Anesthesia Home Care Instructions ° °Activity: °Get plenty of rest for the remainder of the day. A responsible adult should stay with you for 24 hours following the procedure.  °For the next 24 hours, DO NOT: °-Drive a car °-Operate machinery °-Drink alcoholic beverages °-Take any medication unless instructed by your physician °-Make any legal decisions or sign important papers. ° °Meals: °Start with liquid foods such as gelatin or soup. Progress to regular foods as tolerated. Avoid greasy, spicy, heavy foods. If nausea and/or vomiting occur, drink only clear liquids until the nausea and/or vomiting subsides. Call your physician if vomiting continues. ° °Special Instructions/Symptoms: °Your throat may feel dry or sore from the anesthesia or the breathing tube placed in your throat during surgery. If this causes discomfort, gargle with warm salt water. The discomfort should disappear within 24 hours. ° °If you had a scopolamine patch placed behind your ear for the management of post- operative nausea and/or vomiting: ° °1. The medication in the patch is effective for 72 hours, after which it should be removed.  Wrap patch in a tissue and discard in the trash. Wash hands thoroughly with soap and water. °2. You may remove the patch earlier than 72 hours if you experience unpleasant side effects which may include dry mouth, dizziness or visual disturbances. °3. Avoid touching the patch. Wash your hands with soap and water after contact with the patch. °  ° °

## 2015-05-22 ENCOUNTER — Encounter (HOSPITAL_BASED_OUTPATIENT_CLINIC_OR_DEPARTMENT_OTHER): Payer: Self-pay | Admitting: General Surgery

## 2015-05-22 NOTE — Progress Notes (Signed)
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 Let me know you reached her.  hmi ______

## 2015-07-13 ENCOUNTER — Encounter (INDEPENDENT_AMBULATORY_CARE_PROVIDER_SITE_OTHER): Payer: Self-pay | Admitting: *Deleted

## 2015-08-15 ENCOUNTER — Ambulatory Visit (INDEPENDENT_AMBULATORY_CARE_PROVIDER_SITE_OTHER): Payer: Medicaid Other | Admitting: Internal Medicine

## 2016-05-31 ENCOUNTER — Encounter: Payer: Self-pay | Admitting: Gynecologic Oncology

## 2016-05-31 ENCOUNTER — Ambulatory Visit: Payer: Medicaid Other | Attending: Gynecologic Oncology | Admitting: Gynecologic Oncology

## 2016-05-31 VITALS — BP 125/74 | HR 78 | Temp 98.2°F | Resp 18 | Ht 64.0 in | Wt 107.7 lb

## 2016-05-31 DIAGNOSIS — Z833 Family history of diabetes mellitus: Secondary | ICD-10-CM | POA: Diagnosis not present

## 2016-05-31 DIAGNOSIS — K589 Irritable bowel syndrome without diarrhea: Secondary | ICD-10-CM | POA: Insufficient documentation

## 2016-05-31 DIAGNOSIS — Z8719 Personal history of other diseases of the digestive system: Secondary | ICD-10-CM | POA: Insufficient documentation

## 2016-05-31 DIAGNOSIS — Z90710 Acquired absence of both cervix and uterus: Secondary | ICD-10-CM | POA: Diagnosis not present

## 2016-05-31 DIAGNOSIS — K219 Gastro-esophageal reflux disease without esophagitis: Secondary | ICD-10-CM | POA: Insufficient documentation

## 2016-05-31 DIAGNOSIS — Z882 Allergy status to sulfonamides status: Secondary | ICD-10-CM | POA: Insufficient documentation

## 2016-05-31 DIAGNOSIS — M797 Fibromyalgia: Secondary | ICD-10-CM | POA: Insufficient documentation

## 2016-05-31 DIAGNOSIS — Z8249 Family history of ischemic heart disease and other diseases of the circulatory system: Secondary | ICD-10-CM | POA: Insufficient documentation

## 2016-05-31 DIAGNOSIS — N891 Moderate vaginal dysplasia: Secondary | ICD-10-CM | POA: Diagnosis present

## 2016-05-31 DIAGNOSIS — F17210 Nicotine dependence, cigarettes, uncomplicated: Secondary | ICD-10-CM | POA: Insufficient documentation

## 2016-05-31 DIAGNOSIS — I10 Essential (primary) hypertension: Secondary | ICD-10-CM | POA: Diagnosis not present

## 2016-05-31 DIAGNOSIS — Z885 Allergy status to narcotic agent status: Secondary | ICD-10-CM | POA: Diagnosis not present

## 2016-05-31 NOTE — Patient Instructions (Signed)
Plan for CO2 laser of the vagina on July 04, 2016 with Dr. Everitt Amber at the [facility].  You will receive a phone call from the Pre-surgical nurse at the [facility] several days before your procedure to discuss instructions.

## 2016-05-31 NOTE — Progress Notes (Signed)
Consult Note: Gyn-Onc  Consult was requested by Dr. Deatra Ina for the evaluation of [name] 50 y.o. female  CC:  Chief Complaint  Patient presents with  . Vaginal intraepithelial neoplasia II    New consultation    Assessment/Plan:  Ms. 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  It is somewhat diffuse in nature over the anterior vaginal wall. I am recommended CO2 vaginal laser to ablate all visible lesions.  I discussed the nature of the procedure and its risks (vaginal narrowing and dyspareunia).  I had a lengthy discussion with the patient regarding the etiology of dysplasia. We discussed that it was caused by HPV and tobacco use. I discussed that excisional procedures (such as hysterectomy) and ablative procedures such as laser do not prevent future recurrences of dysplasia. These procedures are designed to treat existing sites of dysplasia but have no role in prevention of recurrence. I discussed that cessation of smoking was an important strategy that she could employ to decrease her risk for future recurrences.  We will prescribe vaginal estrogen for use in the weeks after the procedure to optimize healing.  The procedure has been scheduled for October, 2017.  HPI: Yanelly Calley is a very pleasant 50 year old woman who is seen in consultation at the request of Dr Deatra Ina for VAIN II.  The patient has a long-standing history of cervical dysplasia. For this she has remotely undergone a vaginal hysterectomy. More recently she had an abnormal Pap smear in 2016 with ASCUS and HPV negative. This year her Pap smear showed dysplasia with positive high risk HPV detected. Subsequent colposcopic evaluation with Dr. Deatra Ina revealed the AIN 2.  The patient is otherwise fairly healthy. She has a surgical history significant for vaginal hysterotomy 2008 and a bladder sling procedure. She is a smoker. She has a history of stomach ulcers and cannot take ibuprofen.   Current Meds:   Outpatient Encounter Prescriptions as of 05/31/2016  Medication Sig  . amLODipine (NORVASC) 10 MG tablet Take 10 mg by mouth daily.  Marland Kitchen omeprazole (PRILOSEC) 20 MG capsule Take 20 mg by mouth daily.  . promethazine (PHENERGAN) 25 MG tablet Take 1 tablet by mouth every 6 (six) hours as needed.  . traMADol (ULTRAM) 50 MG tablet Take 50 mg by mouth every 6 (six) hours as needed for moderate pain.  . [DISCONTINUED] Aspirin-Acetaminophen (GOODYS BODY PAIN PO) Take 1 Package by mouth daily as needed (pain).  . [DISCONTINUED] HYDROcodone-acetaminophen (NORCO) 5-325 MG per tablet Take 1-2 tablets by mouth every 6 (six) hours as needed for moderate pain or severe pain.   No facility-administered encounter medications on file as of 05/31/2016.     Allergy:  Allergies  Allergen Reactions  . Hydrocodone Other (See Comments)    Nausea, feels hot and insomia  . Sulfa Antibiotics Hives    Social Hx:   Social History   Social History  . Marital status: Single    Spouse name: N/A  . Number of children: N/A  . Years of education: N/A   Occupational History  . Not on file.   Social History Main Topics  . Smoking status: Current Every Day Smoker    Packs/day: 1.00    Types: Cigarettes  . Smokeless tobacco: Never Used  . Alcohol use No  . Drug use: No  . Sexual activity: Yes    Birth control/ protection: Surgical   Other Topics Concern  . Not on file  Social History Narrative  . No narrative on file    Past Surgical Hx:  Past Surgical History:  Procedure Laterality Date  . ABDOMINAL HYSTERECTOMY    . ABDOMINAL SURGERY    . BLADDER SUSPENSION    . BREAST SURGERY    . RADIOACTIVE SEED GUIDED EXCISIONAL BREAST BIOPSY Right 05/19/2015   Procedure: EXCISIONAL RIGHT BREAST MASS WITH RADIOACTIVE SEED LOCALIZATION;  Surgeon: Fanny Skates, MD;  Location: [facility];  Service: General;  Laterality: Right;    Past Medical Hx:  Past Medical History:  Diagnosis Date  . Acid  reflux   . Fibromyalgia   . Gastric ulcer   . Hypertension   . IBS (irritable bowel syndrome)     Past Gynecological History:  Hysterectomy for CIN. Parous No LMP recorded. Patient has had a hysterectomy.  Family Hx:  Family History  Problem Relation Age of Onset  . Hypertension Mother   . Clotting disorder Father   . Diabetes Father   . Hypertension Father   . Hypertension Sister   . Hypertension Brother   . Hypertension Maternal Aunt   . Hypertension Maternal Uncle   . Hypertension Paternal Aunt   . Hypertension Paternal Uncle     Review of Systems:  Constitutional  Feels well,    ENT Normal appearing ears and nares bilaterally Skin/Breast  No rash, sores, jaundice, itching, dryness Cardiovascular  No chest pain, shortness of breath, or edema  Pulmonary  No cough or wheeze.  Gastro Intestinal  No nausea, vomitting, or diarrhoea. No bright red blood per rectum, no abdominal pain, change in bowel movement, or constipation.  Genito Urinary  No frequency, urgency, dysuria,  Musculo Skeletal  No myalgia, arthralgia, joint swelling or pain  Neurologic  No weakness, numbness, change in gait,  Psychology  No depression, anxiety, insomnia.   Vitals:  Blood pressure 125/74, pulse 78, temperature 98.2 F (36.8 C), temperature source Oral, resp. rate 18, height 5\' 4"  (1.626 m), weight 107 lb 11.2 oz (48.9 kg), SpO2 98 %.  Physical Exam: WD in NAD Neck  Supple NROM, without any enlargements.  Lymph Node Survey No cervical supraclavicular or inguinal adenopathy Genito Urinary  Vulva/vagina: Normal external female genitalia.  No lesions. No discharge or bleeding.  Bladder/urethra:  No lesions or masses, well supported bladder  Vagina: colposcopy performed, see below.  Cervix: surgically absent  Uterus: surgically absent   Adnexa: no masses. Rectal  deferred Extremities  No bilateral cyanosis, clubbing or edema.  PROCEDURE NOTE: COLPOSCOPY Speculum was placed  within the vagina and the entire vaginal mucosa was visualized. The colposcopy was used for magnification and white light. The vagina was treated with topical 4% acetic acid. Multiple small 2 mm acetowhite patches were identified and anterior vaginal wall and at the posterior vaginal cuff. No areas concerning for invasive vaginal carcinoma or identified.   Donaciano Eva, MD  05/31/2016, 12:13 PM

## 2016-06-10 ENCOUNTER — Encounter (HOSPITAL_BASED_OUTPATIENT_CLINIC_OR_DEPARTMENT_OTHER): Payer: Self-pay | Admitting: *Deleted

## 2016-06-11 ENCOUNTER — Encounter (HOSPITAL_BASED_OUTPATIENT_CLINIC_OR_DEPARTMENT_OTHER): Payer: Self-pay | Admitting: *Deleted

## 2016-06-11 NOTE — Progress Notes (Signed)
NPO AFTER MN WITH EXCEPTION CLEAR LIQUIDS UNTIL 0730 9NO CREAM/ MILK PRODUCTS).   ARRIVE AT 1145.  NEEDS ISTAT AND EKG.  WILL TAKE PRILOSEC AND NORVASC AM DOS W/ SIPS OF WATER.

## 2016-06-13 ENCOUNTER — Ambulatory Visit (HOSPITAL_BASED_OUTPATIENT_CLINIC_OR_DEPARTMENT_OTHER): Payer: Medicaid Other | Admitting: Anesthesiology

## 2016-06-13 ENCOUNTER — Encounter (HOSPITAL_BASED_OUTPATIENT_CLINIC_OR_DEPARTMENT_OTHER): Payer: Self-pay | Admitting: Gynecologic Oncology

## 2016-06-13 ENCOUNTER — Ambulatory Visit (HOSPITAL_BASED_OUTPATIENT_CLINIC_OR_DEPARTMENT_OTHER)
Admission: RE | Admit: 2016-06-13 | Discharge: 2016-06-13 | Disposition: A | Discharge status: Home / Self Care | Payer: Medicaid Other | Source: Ambulatory Visit | Attending: Gynecologic Oncology | Admitting: Gynecologic Oncology

## 2016-06-13 ENCOUNTER — Other Ambulatory Visit: Payer: Self-pay

## 2016-06-13 ENCOUNTER — Encounter (HOSPITAL_BASED_OUTPATIENT_CLINIC_OR_DEPARTMENT_OTHER)
Admission: RE | Disposition: A | Discharge status: Home / Self Care | Payer: Self-pay | Source: Ambulatory Visit | Attending: Gynecologic Oncology

## 2016-06-13 DIAGNOSIS — N891 Moderate vaginal dysplasia: Secondary | ICD-10-CM | POA: Diagnosis present

## 2016-06-13 DIAGNOSIS — M797 Fibromyalgia: Secondary | ICD-10-CM | POA: Insufficient documentation

## 2016-06-13 DIAGNOSIS — K219 Gastro-esophageal reflux disease without esophagitis: Secondary | ICD-10-CM | POA: Insufficient documentation

## 2016-06-13 DIAGNOSIS — I10 Essential (primary) hypertension: Secondary | ICD-10-CM | POA: Insufficient documentation

## 2016-06-13 DIAGNOSIS — F17210 Nicotine dependence, cigarettes, uncomplicated: Secondary | ICD-10-CM | POA: Diagnosis not present

## 2016-06-13 DIAGNOSIS — Z90710 Acquired absence of both cervix and uterus: Secondary | ICD-10-CM | POA: Diagnosis not present

## 2016-06-13 DIAGNOSIS — Z79899 Other long term (current) drug therapy: Secondary | ICD-10-CM | POA: Diagnosis not present

## 2016-06-13 HISTORY — DX: Personal history of peptic ulcer disease: Z87.11

## 2016-06-13 HISTORY — DX: Personal history of cervical dysplasia: Z87.410

## 2016-06-13 HISTORY — DX: Presence of dental prosthetic device (complete) (partial): Z97.2

## 2016-06-13 HISTORY — DX: Moderate vulvar dysplasia: N90.1

## 2016-06-13 HISTORY — DX: Gastro-esophageal reflux disease without esophagitis: K21.9

## 2016-06-13 HISTORY — DX: Personal history of other diseases of the digestive system: Z87.19

## 2016-06-13 HISTORY — PX: CO2 LASER APPLICATION: SHX5778

## 2016-06-13 HISTORY — DX: Anxiety disorder, unspecified: F41.9

## 2016-06-13 HISTORY — DX: Presence of spectacles and contact lenses: Z97.3

## 2016-06-13 HISTORY — DX: Depression, unspecified: F32.A

## 2016-06-13 HISTORY — DX: Major depressive disorder, single episode, unspecified: F32.9

## 2016-06-13 HISTORY — DX: Benign neoplasm of right breast: D24.1

## 2016-06-13 LAB — POCT I-STAT 4, (NA,K, GLUC, HGB,HCT)
GLUCOSE: 82 mg/dL (ref 65–99)
HEMATOCRIT: 50 % — AB (ref 36.0–46.0)
Hemoglobin: 17 g/dL — ABNORMAL HIGH (ref 12.0–15.0)
Potassium: 4.5 mmol/L (ref 3.5–5.1)
Sodium: 141 mmol/L (ref 135–145)

## 2016-06-13 SURGERY — CO2 LASER APPLICATION
Anesthesia: General | Site: Vagina

## 2016-06-13 MED ORDER — FENTANYL CITRATE (PF) 100 MCG/2ML IJ SOLN
INTRAMUSCULAR | Status: DC | PRN
  Administered 2016-06-13 (×2): 25 ug via INTRAVENOUS
  Administered 2016-06-13: 50 ug via INTRAVENOUS

## 2016-06-13 MED ORDER — FENTANYL CITRATE (PF) 100 MCG/2ML IJ SOLN
INTRAMUSCULAR | Status: AC
  Filled 2016-06-13: qty 2

## 2016-06-13 MED ORDER — ACETAMINOPHEN 500 MG PO TABS
ORAL_TABLET | ORAL | Status: AC
  Filled 2016-06-13: qty 2

## 2016-06-13 MED ORDER — ACETAMINOPHEN 500 MG PO TABS
1000.0000 mg | ORAL_TABLET | Freq: Once | ORAL | Status: AC
  Administered 2016-06-13: 1000 mg via ORAL
  Filled 2016-06-13: qty 2

## 2016-06-13 MED ORDER — MIDAZOLAM HCL 5 MG/5ML IJ SOLN
INTRAMUSCULAR | Status: DC | PRN
  Administered 2016-06-13 (×2): 1 mg via INTRAVENOUS

## 2016-06-13 MED ORDER — STERILE WATER FOR IRRIGATION IR SOLN
Status: DC | PRN
  Administered 2016-06-13: 500 mL

## 2016-06-13 MED ORDER — KETOROLAC TROMETHAMINE 30 MG/ML IJ SOLN
INTRAMUSCULAR | Status: AC
  Filled 2016-06-13: qty 1

## 2016-06-13 MED ORDER — ONDANSETRON HCL 4 MG/2ML IJ SOLN
INTRAMUSCULAR | Status: AC
  Filled 2016-06-13: qty 2

## 2016-06-13 MED ORDER — OXYCODONE HCL 5 MG PO TABS
ORAL_TABLET | ORAL | Status: AC
  Filled 2016-06-13: qty 1

## 2016-06-13 MED ORDER — LIDOCAINE 2% (20 MG/ML) 5 ML SYRINGE
INTRAMUSCULAR | Status: AC
  Filled 2016-06-13: qty 5

## 2016-06-13 MED ORDER — MIDAZOLAM HCL 2 MG/2ML IJ SOLN
INTRAMUSCULAR | Status: AC
  Filled 2016-06-13: qty 2

## 2016-06-13 MED ORDER — MORPHINE SULFATE 15 MG PO TABS: 15.0000 mg | ORAL_TABLET | ORAL | 0 refills | Status: DC | PRN

## 2016-06-13 MED ORDER — PROMETHAZINE HCL 25 MG/ML IJ SOLN
6.2500 mg | INTRAMUSCULAR | Status: DC | PRN
  Filled 2016-06-13: qty 1

## 2016-06-13 MED ORDER — ONDANSETRON HCL 4 MG/2ML IJ SOLN
INTRAMUSCULAR | Status: DC | PRN
  Administered 2016-06-13: 4 mg via INTRAVENOUS

## 2016-06-13 MED ORDER — LACTATED RINGERS IV SOLN
INTRAVENOUS | Status: DC
  Administered 2016-06-13: 12:00:00 via INTRAVENOUS
  Filled 2016-06-13: qty 1000

## 2016-06-13 MED ORDER — ESTROGENS, CONJUGATED 0.625 MG/GM VA CREA: 1.0000 | TOPICAL_CREAM | Freq: Every day | VAGINAL | 12 refills | Status: DC

## 2016-06-13 MED ORDER — OXYCODONE HCL 5 MG PO TABS
5.0000 mg | ORAL_TABLET | Freq: Once | ORAL | Status: AC
Start: 2016-06-13 — End: 2016-06-13
  Administered 2016-06-13: 5 mg via ORAL
  Filled 2016-06-13: qty 1

## 2016-06-13 MED ORDER — DEXAMETHASONE SODIUM PHOSPHATE 4 MG/ML IJ SOLN
INTRAMUSCULAR | Status: DC | PRN
  Administered 2016-06-13: 10 mg via INTRAVENOUS

## 2016-06-13 MED ORDER — SENNA 8.6 MG PO TABS: 1.0000 | ORAL_TABLET | Freq: Every day | ORAL | 0 refills | Status: DC

## 2016-06-13 MED ORDER — FENTANYL CITRATE (PF) 100 MCG/2ML IJ SOLN
25.0000 ug | INTRAMUSCULAR | Status: DC | PRN
  Administered 2016-06-13 (×3): 50 ug via INTRAVENOUS
  Filled 2016-06-13: qty 1

## 2016-06-13 MED ORDER — PROPOFOL 10 MG/ML IV BOLUS
INTRAVENOUS | Status: DC | PRN
  Administered 2016-06-13: 100 mg via INTRAVENOUS

## 2016-06-13 MED ORDER — LIDOCAINE HCL 1 % IJ SOLN: INTRAMUSCULAR | Status: DC | PRN

## 2016-06-13 MED ORDER — PROPOFOL 10 MG/ML IV BOLUS
INTRAVENOUS | Status: AC
  Filled 2016-06-13: qty 20

## 2016-06-13 MED ORDER — KETOROLAC TROMETHAMINE 30 MG/ML IJ SOLN
INTRAMUSCULAR | Status: DC | PRN
  Administered 2016-06-13: 30 mg via INTRAVENOUS

## 2016-06-13 MED ORDER — DEXAMETHASONE SODIUM PHOSPHATE 10 MG/ML IJ SOLN
INTRAMUSCULAR | Status: AC
  Filled 2016-06-13: qty 1

## 2016-06-13 MED ORDER — ACETIC ACID 5 % SOLN
Status: DC | PRN
  Administered 2016-06-13: 1 via TOPICAL

## 2016-06-13 MED ORDER — FENTANYL CITRATE (PF) 100 MCG/2ML IJ SOLN
50.0000 ug | INTRAMUSCULAR | Status: AC | PRN
  Administered 2016-06-13 (×2): 50 ug via INTRAVENOUS
  Filled 2016-06-13: qty 1

## 2016-06-13 SURGICAL SUPPLY — 42 items
APPLICATOR COTTON TIP 6IN STRL (MISCELLANEOUS) ×4 IMPLANT
BLADE SURG 15 STRL LF DISP TIS (BLADE) ×1 IMPLANT
BLADE SURG 15 STRL SS (BLADE) ×1
CANISTER SUCTION 1200CC (MISCELLANEOUS) IMPLANT
CANISTER SUCTION 2500CC (MISCELLANEOUS) IMPLANT
CATH FOLEY 2WAY SLVR  5CC 16FR (CATHETERS)
CATH FOLEY 2WAY SLVR 5CC 16FR (CATHETERS) IMPLANT
CATH ROBINSON RED A/P 16FR (CATHETERS) ×2 IMPLANT
COVER BACK TABLE 60X90IN (DRAPES) ×2 IMPLANT
DEPRESSOR TONGUE BLADE STERILE (MISCELLANEOUS) ×2 IMPLANT
DRAPE LG THREE QUARTER DISP (DRAPES) ×2 IMPLANT
DRAPE UNDERBUTTOCKS STRL (DRAPE) ×2 IMPLANT
DRSG TELFA 3X8 NADH (GAUZE/BANDAGES/DRESSINGS) IMPLANT
GLOVE BIO SURGEON STRL SZ 6 (GLOVE) ×2 IMPLANT
GLOVE BIO SURGEON STRL SZ 6.5 (GLOVE) ×2 IMPLANT
GLOVE INDICATOR 6.5 STRL GRN (GLOVE) ×2 IMPLANT
GLOVE INDICATOR 7.5 STRL GRN (GLOVE) ×2 IMPLANT
GOWN STRL REUS W/ TWL LRG LVL3 (GOWN DISPOSABLE) ×1 IMPLANT
GOWN STRL REUS W/TWL LRG LVL3 (GOWN DISPOSABLE) ×3 IMPLANT
KIT ROOM TURNOVER WOR (KITS) ×2 IMPLANT
LEGGING LITHOTOMY PAIR STRL (DRAPES) ×2 IMPLANT
NEEDLE HYPO 25X1 1.5 SAFETY (NEEDLE) IMPLANT
PACK BASIN DAY SURGERY FS (CUSTOM PROCEDURE TRAY) ×2 IMPLANT
PAD PREP 24X48 CUFFED NSTRL (MISCELLANEOUS) ×2 IMPLANT
PENCIL BUTTON HOLSTER BLD 10FT (ELECTRODE) IMPLANT
SCOPETTES 8  STERILE (MISCELLANEOUS) ×2
SCOPETTES 8 STERILE (MISCELLANEOUS) ×2 IMPLANT
SUT VIC AB 0 CT1 36 (SUTURE) IMPLANT
SUT VIC AB 2-0 SH 27 (SUTURE)
SUT VIC AB 2-0 SH 27XBRD (SUTURE) IMPLANT
SUT VIC AB 3-0 PS2 18 (SUTURE)
SUT VIC AB 3-0 PS2 18XBRD (SUTURE) IMPLANT
SUT VIC AB 3-0 SH 27 (SUTURE)
SUT VIC AB 3-0 SH 27X BRD (SUTURE) IMPLANT
SUT VIC AB 4-0 PS2 18 (SUTURE) IMPLANT
SYR CONTROL 10ML LL (SYRINGE) ×2 IMPLANT
TOWEL OR 17X24 6PK STRL BLUE (TOWEL DISPOSABLE) ×4 IMPLANT
TRAY DSU PREP LF (CUSTOM PROCEDURE TRAY) ×2 IMPLANT
VACUUM HOSE 7/8X10 W/ WAND (MISCELLANEOUS) IMPLANT
VACUUM HOSE/TUBING 7/8INX6FT (MISCELLANEOUS) ×2 IMPLANT
WATER STERILE IRR 500ML POUR (IV SOLUTION) ×2 IMPLANT
YANKAUER SUCT BULB TIP NO VENT (SUCTIONS) ×2 IMPLANT

## 2016-06-13 NOTE — Anesthesia Procedure Notes (Signed)
Procedure Name: LMA Insertion Date/Time: 06/13/2016 2:37 PM Performed by: Franne Grip Pre-anesthesia Checklist: Patient identified, Emergency Drugs available, Suction available and Patient being monitored Patient Re-evaluated:Patient Re-evaluated prior to inductionOxygen Delivery Method: Circle system utilized Preoxygenation: Pre-oxygenation with 100% oxygen Intubation Type: IV induction Ventilation: Mask ventilation without difficulty LMA: LMA inserted LMA Size: 4.0 Number of attempts: 1 Airway Equipment and Method: Bite block Placement Confirmation: positive ETCO2 Tube secured with: Tape Dental Injury: Teeth and Oropharynx as per pre-operative assessment

## 2016-06-13 NOTE — Interval H&P Note (Signed)
History and Physical Interval Note:  06/13/2016 1:45 PM  [name]  has presented today for surgery, with the diagnosis of vaginal intraepithelial neoplasia  The various methods of treatment have been discussed with the patient and family. After consideration of risks, benefits and other options for treatment, the patient has consented to  Procedure(s): CO2 LASER OF THE VAGINA (N/A) as a surgical intervention .  The patient's history has been reviewed, patient examined, no change in status, stable for surgery.  I have reviewed the patient's chart and labs.  Questions were answered to the patient's satisfaction.     [name]

## 2016-06-13 NOTE — Anesthesia Preprocedure Evaluation (Signed)
Anesthesia Evaluation  Patient identified by MRN, date of birth, ID band Patient awake    Reviewed: Allergy & Precautions, NPO status , Patient's Chart, lab work & pertinent test results  Airway Mallampati: II  TM Distance: >3 FB Neck ROM: Full    Dental no notable dental hx.    Pulmonary Current Smoker,    Pulmonary exam normal breath sounds clear to auscultation       Cardiovascular hypertension, Normal cardiovascular exam Rhythm:Regular Rate:Normal     Neuro/Psych PSYCHIATRIC DISORDERS Anxiety Depression  Neuromuscular disease    GI/Hepatic Neg liver ROS, GERD  ,  Endo/Other  negative endocrine ROS  Renal/GU negative Renal ROS  negative genitourinary   Musculoskeletal  (+) Fibromyalgia -  Abdominal   Peds negative pediatric ROS (+)  Hematology negative hematology ROS (+)   Anesthesia Other Findings   Reproductive/Obstetrics negative OB ROS                             Anesthesia Physical Anesthesia Plan  ASA: II  Anesthesia Plan: General   Post-op Pain Management:    Induction: Intravenous  Airway Management Planned: LMA  Additional Equipment:   Intra-op Plan:   Post-operative Plan: Extubation in OR  Informed Consent: I have reviewed the patients History and Physical, chart, labs and discussed the procedure including the risks, benefits and alternatives for the proposed anesthesia with the patient or authorized representative who has indicated his/her understanding and acceptance.   Dental advisory given  Plan Discussed with: CRNA  Anesthesia Plan Comments:         Anesthesia Quick Evaluation

## 2016-06-13 NOTE — Op Note (Signed)
PATIENT: [name] DATE: 06/13/16   Preop Diagnosis: VAIN II  Postoperative Diagnosis: same  Surgery: CO2 laser of the vagina  Surgeons:  Donaciano Eva, MD Assistant: none  Anesthesia: General   Estimated blood loss: <35ml  IVF:  150ml   Urine output: 20 ml   Complications: None   Pathology: none   Operative findings: multifocal VAIN II (acetowhite changes) across anterior vaginal wall and posterior vaginal cuff.  Procedure: The patient was identified in the preoperative holding area. Informed consent was signed on the chart. Patient was seen history was reviewed and exam was performed.   The patient was then taken to the operating room and placed in the supine position with SCD hose on. General anesthesia was then induced without difficulty. She was then placed in the dorsolithotomy position. The perineum was prepped with Betadine. The vagina was prepped with Betadine. The patient was then draped after the prep was dried. A Foley catheter was inserted into the bladder under sterile conditions to drain the bladder then removed.  Timeout was performed the patient, procedure, antibiotic, allergy, and length of procedure. 5% acetic acid solution was applied to the vagina. The vaginal tissues were inspected for areas of acetowhite changes or leukoplakia. The lesions were identified.   The patient's surgical field was draped with wet towels. The staff and patient ensured laser-safe eyewear and masks were fitted. The laser was set to 10 watts continuous. The laser was tested for accuracy on a tongue depressor.  The laser was applied to the circumscribed area of the vagina that had been previously identified. The tissue was ablated to the desired depth and the eschar was removed with a moistened sponge. When the entire lesions had been ablated the procedure was complete.  All instrument, suture, laparotomy, Ray-Tec, and needle counts were correct x2. The patient  tolerated the procedure well and was taken recovery room in stable condition. This is Everitt Amber dictating an operative note on Tram[facility] [place].  Donaciano Eva, MD

## 2016-06-13 NOTE — H&P (View-Only) (Signed)
Consult Note: Gyn-Onc  Consult was requested by Dr. Deatra Ina for the evaluation of [name] 50 y.o. female  CC:  Chief Complaint  Patient presents with  . Vaginal intraepithelial neoplasia II    New consultation    Assessment/Plan:  Ms. [name]  is a 50 y.o.  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  It is somewhat diffuse in nature over the anterior vaginal wall. I am recommended CO2 vaginal laser to ablate all visible lesions.  I discussed the nature of the procedure and its risks (vaginal narrowing and dyspareunia).  I had a lengthy discussion with the patient regarding the etiology of dysplasia. We discussed that it was caused by HPV and tobacco use. I discussed that excisional procedures (such as hysterectomy) and ablative procedures such as laser do not prevent future recurrences of dysplasia. These procedures are designed to treat existing sites of dysplasia but have no role in prevention of recurrence. I discussed that cessation of smoking was an important strategy that she could employ to decrease her risk for future recurrences.  We will prescribe vaginal estrogen for use in the weeks after the procedure to optimize healing.  The procedure has been scheduled for October, 2017.  HPI: [name] is a very pleasant 50 year old woman who is seen in consultation at the request of Dr Deatra Ina for VAIN II.  The patient has a long-standing history of cervical dysplasia. For this she has remotely undergone a vaginal hysterectomy. More recently she had an abnormal Pap smear in 2016 with ASCUS and HPV negative. This year her Pap smear showed dysplasia with positive high risk HPV detected. Subsequent colposcopic evaluation with Dr. Deatra Ina revealed the AIN 2.  The patient is otherwise fairly healthy. She has a surgical history significant for vaginal hysterotomy 2008 and a bladder sling procedure. She is a smoker. She has a history of stomach ulcers and cannot take ibuprofen.   Current Meds:   Outpatient Encounter Prescriptions as of 05/31/2016  Medication Sig  . amLODipine (NORVASC) 10 MG tablet Take 10 mg by mouth daily.  Marland Kitchen omeprazole (PRILOSEC) 20 MG capsule Take 20 mg by mouth daily.  . promethazine (PHENERGAN) 25 MG tablet Take 1 tablet by mouth every 6 (six) hours as needed.  . traMADol (ULTRAM) 50 MG tablet Take 50 mg by mouth every 6 (six) hours as needed for moderate pain.  . [DISCONTINUED] Aspirin-Acetaminophen (GOODYS BODY PAIN PO) Take 1 Package by mouth daily as needed (pain).  . [DISCONTINUED] HYDROcodone-acetaminophen (NORCO) 5-325 MG per tablet Take 1-2 tablets by mouth every 6 (six) hours as needed for moderate pain or severe pain.   No facility-administered encounter medications on file as of 05/31/2016.     Allergy:  Allergies  Allergen Reactions  . Hydrocodone Other (See Comments)    Nausea, feels hot and insomia  . Sulfa Antibiotics Hives    Social Hx:   Social History   Social History  . Marital status: Single    Spouse name: N/A  . Number of children: N/A  . Years of education: N/A   Occupational History  . Not on file.   Social History Main Topics  . Smoking status: Current Every Day Smoker    Packs/day: 1.00    Types: Cigarettes  . Smokeless tobacco: Never Used  . Alcohol use No  . Drug use: No  . Sexual activity: Yes    Birth control/ protection: Surgical   Other Topics Concern  . Not on file  Social History Narrative  . No narrative on file    Past Surgical Hx:  Past Surgical History:  Procedure Laterality Date  . ABDOMINAL HYSTERECTOMY    . ABDOMINAL SURGERY    . BLADDER SUSPENSION    . BREAST SURGERY    . RADIOACTIVE SEED GUIDED EXCISIONAL BREAST BIOPSY Right 05/19/2015   Procedure: EXCISIONAL RIGHT BREAST MASS WITH RADIOACTIVE SEED LOCALIZATION;  Surgeon: Fanny Skates, MD;  Location: [facility];  Service: General;  Laterality: Right;    Past Medical Hx:  Past Medical History:  Diagnosis Date  . Acid  reflux   . Fibromyalgia   . Gastric ulcer   . Hypertension   . IBS (irritable bowel syndrome)     Past Gynecological History:  Hysterectomy for CIN. Parous No LMP recorded. Patient has had a hysterectomy.  Family Hx:  Family History  Problem Relation Age of Onset  . Hypertension Mother   . Clotting disorder Father   . Diabetes Father   . Hypertension Father   . Hypertension Sister   . Hypertension Brother   . Hypertension Maternal Aunt   . Hypertension Maternal Uncle   . Hypertension Paternal Aunt   . Hypertension Paternal Uncle     Review of Systems:  Constitutional  Feels well,    ENT Normal appearing ears and nares bilaterally Skin/Breast  No rash, sores, jaundice, itching, dryness Cardiovascular  No chest pain, shortness of breath, or edema  Pulmonary  No cough or wheeze.  Gastro Intestinal  No nausea, vomitting, or diarrhoea. No bright red blood per rectum, no abdominal pain, change in bowel movement, or constipation.  Genito Urinary  No frequency, urgency, dysuria,  Musculo Skeletal  No myalgia, arthralgia, joint swelling or pain  Neurologic  No weakness, numbness, change in gait,  Psychology  No depression, anxiety, insomnia.   Vitals:  Blood pressure 125/74, pulse 78, temperature 98.2 F (36.8 C), temperature source Oral, resp. rate 18, height 5\' 4"  (1.626 m), weight 107 lb 11.2 oz (48.9 kg), SpO2 98 %.  Physical Exam: WD in NAD Neck  Supple NROM, without any enlargements.  Lymph Node Survey No cervical supraclavicular or inguinal adenopathy Genito Urinary  Vulva/vagina: Normal external female genitalia.  No lesions. No discharge or bleeding.  Bladder/urethra:  No lesions or masses, well supported bladder  Vagina: colposcopy performed, see below.  Cervix: surgically absent  Uterus: surgically absent   Adnexa: no masses. Rectal  deferred Extremities  No bilateral cyanosis, clubbing or edema.  PROCEDURE NOTE: COLPOSCOPY Speculum was placed  within the vagina and the entire vaginal mucosa was visualized. The colposcopy was used for magnification and white light. The vagina was treated with topical 4% acetic acid. Multiple small 2 mm acetowhite patches were identified and anterior vaginal wall and at the posterior vaginal cuff. No areas concerning for invasive vaginal carcinoma or identified.   Donaciano Eva, MD  05/31/2016, 12:13 PM

## 2016-06-13 NOTE — Transfer of Care (Signed)
Last Vitals:  Vitals:   06/13/16 1352 06/13/16 1518  BP:  (P) 124/79  Pulse: 85   Resp: 16 (P) 18  Temp:  (P) 37.1 C    Last Pain:  Vitals:   06/13/16 1408  TempSrc:   PainSc: 8       Patients Stated Pain Goal: 9 (06/13/16 1202)  Immediate Anesthesia Transfer of Care Note  Patient: [name]  Procedure(s) Performed: Procedure(s) (LRB): CO2 LASER OF THE VAGINA (N/A)  Patient Location: PACU  Anesthesia Type: General  Level of Consciousness: awake, alert  and oriented  Airway & Oxygen Therapy: Patient Spontanous Breathing and Patient connected to nasal cannula oxygen  Post-op Assessment: Report given to PACU RN and Post -op Vital signs reviewed and stable  Post vital signs: Reviewed and stable  Complications: No apparent anesthesia complications

## 2016-06-13 NOTE — Anesthesia Postprocedure Evaluation (Signed)
Anesthesia Post Note  Patient: [name]  Procedure(s) Performed: Procedure(s) (LRB): CO2 LASER OF THE VAGINA (N/A)  Patient location during evaluation: PACU Anesthesia Type: General Level of consciousness: awake and alert Pain management: pain level controlled Vital Signs Assessment: post-procedure vital signs reviewed and stable Respiratory status: spontaneous breathing, nonlabored ventilation, respiratory function stable and patient connected to nasal cannula oxygen Cardiovascular status: blood pressure returned to baseline and stable Postop Assessment: no signs of nausea or vomiting Anesthetic complications: no    Last Vitals:  Vitals:   06/13/16 1630 06/13/16 1700  BP: 113/62 121/70  Pulse: 91 93  Resp: (!) 9 10  Temp:  36.9 C    Last Pain:  Vitals:   06/13/16 1630  TempSrc:   PainSc: 5                  Elisah Parmer J

## 2016-06-13 NOTE — Discharge Instructions (Signed)
Vaginal Laser After Care  ACTIVITY  Rest as much as possible the first two days after discharge.  No restrictions on heavy lifting   Do not drive a car for 24 hours  Increase activity gradually.  NUTRITION  You may resume your normal diet.  Drink 6 to 8 glasses of fluids a day.  Eat a healthy, balanced diet including portions of food from the meat (protein), milk, fruit, vegetable, and bread groups.  Your caregiver may recommend you take a multivitamin with iron.  ELIMINATION  You may notice that it burns when you urinate. To minimize this spray water onto your vulva as the urine passes out to dilute the urine. We will provide you with a spray bottle. A regular bottle of tap water can also be used.  Pat the area dry with toilet tissue or towel after voiding urine or stool. Do not wipe.  A hair dryer on the cool setting is also comforting to dry or soothe the area.  If constipation occurs, drink more liquids, and add more fruits, vegetables, and bran to your diet. You may take a mild laxative, such as Milk of Magnesia, Metamucil, or a stool softener such as Colace, with permission from your caregiver.  HYGIENE  You may shower and wash your hair.  Avoid tub baths for 4 weeks.  Do not add any bath oils or chemicals to your bath water, after you have permission to take baths.  While passing urine, pour water from a bottle or spray over your vulva to dilute the urine as it passes the incision (this will decrease burning and discomfort).  Clean yourself well after moving your bowels.  A sitz bath will help keep your perineal area clean, reduce swelling, and provide comfort.  HOME CARE INSTRUCTIONS    Avoid activities that involve a lot of friction between your legs.  Take your temperature twice a day and record it, especially if you feel feverish or have chills.  Follow your caregiver's instructions about medicines, activity, and follow-up appointments after  surgery.  Do not drink alcohol while taking pain medicine.  You may take over-the-counter medicine for pain, recommended by your caregiver.  If your pain is not relieved with medicine, call your caregiver.  Do not douche or use tampons (use a nonperfumed sanitary pad).  Do not have sexual intercourse until your caregiver gives you permission (typically 6 weeks postoperatively). Hugging, kissing, and playful sexual activity is fine with your caregiver's permission.  Warm sitz baths, with your caregiver's permission, are helpful to control swelling and discomfort.  Take showers instead of baths, until your caregiver gives you permission to take baths.  You may take a mild medicine for constipation, recommended by your caregiver. Bran foods and drinking a lot of fluids will help with constipation.  Make sure your family understands everything about your operation and recovery.  Apply 1gm premarin cream to the vagina every evening for 3 weeks.  SEEK MEDICAL CARE IF:   You notice swelling and redness around the wound area.  You notice a foul smell coming from the wound or on the surgical dressing.  You notice the wound is separating.  You have painful or bloody urination.  You develop nausea and vomiting.  You develop diarrhea.  You develop a rash.  You have a reaction or allergy from the medicine.  You feel dizzy or light-headed.  You need stronger pain medicine. SEEK IMMEDIATE MEDICAL CARE IF:   You develop a temperature of 102 F (  38.9 C) or higher.  You pass out.  You develop leg or chest pain.  You develop abdominal pain.  You develop shortness of breath.  You develop bleeding from the wound area.  You see pus in the wound area. MAKE SURE YOU:   Understand these instructions.  Will watch your condition.  Will get help right away if you are not doing well or get worse.  Contact Dr Denman George at # [phone]. After hours this line will connect to the  after-hours-nurse line which will contact the doctor on call.  Document Released: 04/23/2004 Document Revised: 01/24/2014 Document Reviewed: 08/11/2009 [facility] Patient Information 2015 [place], [place]. This information is not intended to replace advice given to you by your health care provider. Make sure you discuss any questions you have with your health care provider.   Post Anesthesia Home Care Instructions  Activity: Get plenty of rest for the remainder of the day. A responsible adult should stay with you for 24 hours following the procedure.  For the next 24 hours, DO NOT: -Drive a car -Paediatric nurse -Drink alcoholic beverages -Take any medication unless instructed by your physician -Make any legal decisions or sign important papers.  Meals: Start with liquid foods such as gelatin or soup. Progress to regular foods as tolerated. Avoid greasy, spicy, heavy foods. If nausea and/or vomiting occur, drink only clear liquids until the nausea and/or vomiting subsides. Call your physician if vomiting continues.  Special Instructions/Symptoms: Your throat may feel dry or sore from the anesthesia or the breathing tube placed in your throat during surgery. If this causes discomfort, gargle with warm salt water. The discomfort should disappear within 24 hours.  If you had a scopolamine patch placed behind your ear for the management of post- operative nausea and/or vomiting:  1. The medication in the patch is effective for 72 hours, after which it should be removed.  Wrap patch in a tissue and discard in the trash. Wash hands thoroughly with soap and water. 2. You may remove the patch earlier than 72 hours if you experience unpleasant side effects which may include dry mouth, dizziness or visual disturbances. 3. Avoid touching the patch. Wash your hands with soap and water after contact with the patch.

## 2016-06-14 ENCOUNTER — Encounter (HOSPITAL_BASED_OUTPATIENT_CLINIC_OR_DEPARTMENT_OTHER): Payer: Self-pay | Admitting: Gynecologic Oncology

## 2016-06-17 ENCOUNTER — Telehealth: Payer: Self-pay

## 2016-06-17 DIAGNOSIS — R112 Nausea with vomiting, unspecified: Secondary | ICD-10-CM

## 2016-06-17 DIAGNOSIS — Z9889 Other specified postprocedural states: Principal | ICD-10-CM

## 2016-06-17 MED ORDER — ONDANSETRON HCL 4 MG PO TABS: 4.0000 mg | ORAL_TABLET | Freq: Four times a day (QID) | ORAL | 0 refills | Status: DC | PRN

## 2016-06-17 MED ORDER — ONDANSETRON HCL 8 MG PO TABS: 4.0000 mg | ORAL_TABLET | Freq: Four times a day (QID) | ORAL | 0 refills | Status: DC | PRN

## 2016-06-17 NOTE — Telephone Encounter (Signed)
Patient called with complaints of intermittent nausea.Melissa Cross, APNP updated , orders are for Zofran 4 mg PO every 6 hours as needed for nausea. QTY: 20 , No refills . Patient updated with recommendations and states understanding . Patient also informed to monitor BM as Zofran can cause constipation.Patient denies further questions at this time.

## 2016-06-17 NOTE — Addendum Note (Signed)
Addended by: Joylene John D on: 06/17/2016 12:38 PM   Modules accepted: Orders

## 2016-06-24 ENCOUNTER — Telehealth: Payer: Self-pay

## 2016-06-24 NOTE — Telephone Encounter (Signed)
Orders received from [facility] to contact the patient to follow up on call from the [facility] on 06/23/16 . No answer , left a detailed message with call back information provided. Melissa Cross, APNP aware.

## 2016-06-25 NOTE — Telephone Encounter (Signed)
Patient contacted to follow up on call to the Medical call center on 06/23/2016. Patient states her mother was sick with a sore throat and diarrhea . Patient states she was sick to her stomach ,but feels better now. Patient also states her pain is better as well . Patient also asking what Dr Denman George saw during her procedure . Patient informed that no pathology sampling was obtained during the procedure due to it being a CO2 laser procedure . Patient states understanding and is aware of her upcoming post-op follow up visit on July 15, 2016. Patient instructed to call with any additional changes , questions or concerns.

## 2016-06-26 ENCOUNTER — Telehealth: Payer: Self-pay | Admitting: *Deleted

## 2016-06-26 NOTE — Telephone Encounter (Signed)
"  I need to speak with Dr.Rossi's nurse."  Call transferred ext [phone].

## 2016-07-15 ENCOUNTER — Ambulatory Visit: Payer: Medicaid Other | Admitting: Gynecologic Oncology

## 2016-07-22 ENCOUNTER — Encounter: Payer: Self-pay | Admitting: Gynecologic Oncology

## 2016-07-29 ENCOUNTER — Ambulatory Visit: Payer: Medicaid Other | Attending: Gynecologic Oncology | Admitting: Gynecologic Oncology

## 2016-07-29 ENCOUNTER — Encounter: Payer: Self-pay | Admitting: Gynecologic Oncology

## 2016-07-29 VITALS — BP 120/61 | HR 89 | Temp 98.3°F | Resp 18 | Wt 109.3 lb

## 2016-07-29 DIAGNOSIS — Z90710 Acquired absence of both cervix and uterus: Secondary | ICD-10-CM | POA: Diagnosis not present

## 2016-07-29 DIAGNOSIS — Z8719 Personal history of other diseases of the digestive system: Secondary | ICD-10-CM | POA: Diagnosis not present

## 2016-07-29 DIAGNOSIS — Z716 Tobacco abuse counseling: Secondary | ICD-10-CM

## 2016-07-29 DIAGNOSIS — Z79899 Other long term (current) drug therapy: Secondary | ICD-10-CM | POA: Diagnosis not present

## 2016-07-29 DIAGNOSIS — K6282 Dysplasia of anus: Secondary | ICD-10-CM | POA: Insufficient documentation

## 2016-07-29 DIAGNOSIS — N644 Mastodynia: Secondary | ICD-10-CM | POA: Diagnosis not present

## 2016-07-29 DIAGNOSIS — F17210 Nicotine dependence, cigarettes, uncomplicated: Secondary | ICD-10-CM | POA: Insufficient documentation

## 2016-07-29 DIAGNOSIS — N891 Moderate vaginal dysplasia: Secondary | ICD-10-CM | POA: Diagnosis present

## 2016-07-29 NOTE — Patient Instructions (Addendum)
Plan to follow up with Dr Deatra Ina for a PAP in May of 2018 as discussed by Dr Everitt Amber.  Thank you

## 2016-07-29 NOTE — Progress Notes (Signed)
Consult Note: Gyn-Onc  Consult was requested by Dr. Deatra Ina for the evaluation of [name] 50 y.o. female  CC:  Chief Complaint  Patient presents with  . VAIN II    follow up    Assessment/Plan:  [name]  is a 50 y.o.  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 She has healed well from her procedure. I discussed that she is at a high risk for recurrence given the diffuse distribution of her dysplasia. I recommend pap is repeated in 6 months with Dr Deatra Ina. If she develops recurrence, I would recommend vaginal 5FU as the next treatment choice. I discussed that continued tobacco abuse increases the risk for recurrence and encouraged her to stop.  She has had some breast tenderness recently which is likely secondary to the vaginal premarin exposure. I recommended she notify Dr Dalbert Batman if this persists beyond 1 month from discontinuing preparation.  HPI: [name] is a very pleasant 50 year old woman who is seen in consultation at the request of Dr Deatra Ina for VAIN II.  The patient has a long-standing history of cervical dysplasia. For this she has remotely undergone a vaginal hysterectomy. More recently she had an abnormal Pap smear in 2016 with ASCUS and HPV negative. This year her Pap smear showed dysplasia with positive high risk HPV detected. Subsequent colposcopic evaluation with Dr. Deatra Ina revealed the AIN 2.  The patient is otherwise fairly healthy. She has a surgical history significant for vaginal hysterotomy 2008 and a bladder sling procedure. She is a smoker. She has a history of stomach ulcers and cannot take ibuprofen.   Interval Hx:  The patient underwent Co2 laser of the vagina on 06/13/16.  The procedure was uncomplicated. She used vaginal premarin for 2 weeks postoperatively and developed breast tenderness postop. She discontinued premarin 2 weeks ago.  Current Meds:  Outpatient Encounter Prescriptions as of 07/29/2016  Medication  Sig  . amLODipine (NORVASC) 10 MG tablet Take 10 mg by mouth every morning.   . mupirocin ointment (BACTROBAN) 2 % APPLY TO THE AFFECTED AREA THREE TIMES DAILY FOR 7 DAYS  . omeprazole (PRILOSEC) 20 MG capsule Take 20 mg by mouth every morning.   . ondansetron (ZOFRAN) 4 MG tablet Take 1 tablet (4 mg total) by mouth every 6 (six) hours as needed for nausea or vomiting.  . potassium chloride (K-DUR,KLOR-CON) 10 MEQ tablet Take 10 mEq by mouth every morning.  . promethazine (PHENERGAN) 25 MG tablet Take 1 tablet by mouth every 6 (six) hours as needed.  . senna (SENOKOT) 8.6 MG TABS tablet Take 1 tablet (8.6 mg total) by mouth at bedtime.  . traMADol (ULTRAM) 50 MG tablet Take 50 mg by mouth every 6 (six) hours as needed for moderate pain.  . valACYclovir (VALTREX) 1000 MG tablet Take 1,000 mg by mouth daily.  . [DISCONTINUED] conjugated estrogens (PREMARIN) vaginal cream Place 1 Applicatorful vaginally daily.  . [DISCONTINUED] morphine (MSIR) 15 MG tablet Take 1 tablet (15 mg total) by mouth every 4 (four) hours as needed for severe pain.   No facility-administered encounter medications on file as of 07/29/2016.     Allergy:  Allergies  Allergen Reactions  . Hydrocodone Other (See Comments)    Nausea, feels hot and insomia  . Sulfa Antibiotics Hives    Social Hx:   Social History   Social History  . Marital status: Single    Spouse name: N/A  . Number  of children: N/A  . Years of education: N/A   Occupational History  . Not on file.   Social History Main Topics  . Smoking status: Current Every Day Smoker    Packs/day: 1.00    Years: 18.00    Types: Cigarettes  . Smokeless tobacco: Never Used  . Alcohol use No  . Drug use: No  . Sexual activity: Yes    Birth control/ protection: Surgical   Other Topics Concern  . Not on file   Social History Narrative  . No narrative on file    Past Surgical Hx:  Past Surgical History:  Procedure Laterality Date  . BLADDER SLING  PROCEDURE  2005  approx.  . CERVICAL CONIZATION W/BX  2006  approx  . CO2 LASER APPLICATION N/A [id number]   Procedure: CO2 LASER OF THE VAGINA;  Surgeon: Everitt Amber, MD;  Location[facility];  Service: Gynecology;  Laterality: N/A;  . RADIOACTIVE SEED GUIDED EXCISIONAL BREAST BIOPSY Right 05/19/2015   Procedure: EXCISIONAL RIGHT BREAST MASS WITH RADIOACTIVE SEED LOCALIZATION;  Surgeon: Fanny Skates, MD;  Location: [facility];  Service: General;  Laterality: Right;  . REPAIR OF PERFORATED ULCER  1992   duodenal  . VAGINAL HYSTERECTOMY  11/13/2006    Past Medical Hx:  Past Medical History:  Diagnosis Date  . Anxiety   . Depression   . Fibroadenoma of right breast    S/P LUMPECTOMY W/ RADIOACTIVE SEED IMPLANT 05-19-2015  . Fibromyalgia   . GERD (gastroesophageal reflux disease)   . History of cervical dysplasia    s/p conzation and recurrent s/p  vaginal hysterectomy 2008  . History of duodenal ulcer    1992--  S/P  REPAIR PERFORATED ULCER  . History of gastric ulcer    age 78  . Hypertension   . IBS (irritable bowel syndrome)   . VIN II (vulvar intraepithelial neoplasia II)   . Wears glasses   . Wears partial dentures    lower    Past Gynecological History:  Hysterectomy for CIN. Parous No LMP recorded. Patient has had a hysterectomy.  Family Hx:  Family History  Problem Relation Age of Onset  . Hypertension Mother   . Clotting disorder Father   . Diabetes Father   . Hypertension Father   . Hypertension Sister   . Hypertension Brother   . Hypertension Maternal Aunt   . Hypertension Maternal Uncle   . Hypertension Paternal Aunt   . Hypertension Paternal Uncle     Review of Systems:  Constitutional  Feels well,    ENT Normal appearing ears and nares bilaterally Skin/Breast  No rash, sores, jaundice, itching, dryness Cardiovascular  No chest pain, shortness of breath, or edema  Pulmonary  No cough or wheeze.  Gastro Intestinal   No nausea, vomitting, or diarrhoea. No bright red blood per rectum, no abdominal pain, change in bowel movement, or constipation.  Genito Urinary  No frequency, urgency, dysuria,  Musculo Skeletal  No myalgia, arthralgia, joint swelling or pain  Neurologic  No weakness, numbness, change in gait,  Psychology  No depression, anxiety, insomnia.   Vitals:  Blood pressure 120/61, pulse 89, temperature 98.3 F (36.8 C), temperature source Oral, resp. rate 18, weight 109 lb 4.8 oz (49.6 kg), SpO2 99 %.  Physical Exam: WD in NAD Neck  Supple NROM, without any enlargements.  Lymph Node Survey No cervical supraclavicular or inguinal adenopathy Genito Urinary  Vulva/vagina: Normal external female genitalia.  No lesions. No  discharge or bleeding.  Bladder/urethra:  No lesions or masses, well supported bladder  Vagina: healed normally without lesions.  Cervix: surgically absent  Uterus: surgically absent   Adnexa: no masses. Rectal  deferred Extremities  No bilateral cyanosis, clubbing or edema.   Donaciano Eva, MD  07/29/2016, 3:47 PM

## 2016-08-09 ENCOUNTER — Encounter (INDEPENDENT_AMBULATORY_CARE_PROVIDER_SITE_OTHER): Payer: Self-pay | Admitting: Internal Medicine

## 2016-08-28 ENCOUNTER — Ambulatory Visit (INDEPENDENT_AMBULATORY_CARE_PROVIDER_SITE_OTHER): Payer: Medicaid Other | Admitting: Internal Medicine

## 2016-08-28 ENCOUNTER — Encounter (INDEPENDENT_AMBULATORY_CARE_PROVIDER_SITE_OTHER): Payer: Self-pay | Admitting: Internal Medicine

## 2016-09-12 ENCOUNTER — Ambulatory Visit (INDEPENDENT_AMBULATORY_CARE_PROVIDER_SITE_OTHER): Payer: Medicaid Other | Admitting: Internal Medicine

## 2016-10-07 ENCOUNTER — Ambulatory Visit (INDEPENDENT_AMBULATORY_CARE_PROVIDER_SITE_OTHER): Payer: Medicaid Other | Admitting: Internal Medicine

## 2016-12-26 ENCOUNTER — Other Ambulatory Visit (INDEPENDENT_AMBULATORY_CARE_PROVIDER_SITE_OTHER): Payer: Self-pay | Admitting: Internal Medicine

## 2016-12-26 ENCOUNTER — Encounter (INDEPENDENT_AMBULATORY_CARE_PROVIDER_SITE_OTHER): Payer: Self-pay | Admitting: Internal Medicine

## 2016-12-26 ENCOUNTER — Encounter (INDEPENDENT_AMBULATORY_CARE_PROVIDER_SITE_OTHER): Payer: Self-pay | Admitting: *Deleted

## 2016-12-26 ENCOUNTER — Ambulatory Visit (INDEPENDENT_AMBULATORY_CARE_PROVIDER_SITE_OTHER): Payer: Medicaid Other | Admitting: Internal Medicine

## 2016-12-26 VITALS — BP 104/70 | HR 72 | Temp 98.0°F | Ht 64.0 in | Wt 108.5 lb

## 2016-12-26 DIAGNOSIS — I10 Essential (primary) hypertension: Secondary | ICD-10-CM

## 2016-12-26 DIAGNOSIS — R1013 Epigastric pain: Secondary | ICD-10-CM

## 2016-12-26 DIAGNOSIS — G8929 Other chronic pain: Secondary | ICD-10-CM | POA: Insufficient documentation

## 2016-12-26 HISTORY — DX: Essential (primary) hypertension: I10

## 2016-12-26 NOTE — Patient Instructions (Addendum)
US abdomen. EGD. The risks and benefits such as perforation, bleeding, and infection were reviewed with the patient and is agreeable. Take the Protonix twice a day.  If pain worsens, go to the ED.

## 2016-12-26 NOTE — Progress Notes (Signed)
Subjective:    Patient ID: [name], female    DOB: [birth year], 51 y.o.   MRN: [id number]  HPI Referred by Dr. Wenda Overland for nausea. She tells me she has ulcer pain. She says she has had pain for a week. She says she has been under a lot of stress. She also says she has had some diarrhea.  She says she has had cancer cells in her cervix and is followed by Dr. Deatra Ina in [place]. She describes the pain as a 10. She says the pain radiates into her back.  Her appetite is okay. She may have lost a pound. She has been on Protonix for a long time. She says she was diagnosed with a stomach ulcer years ago by EGD. She has a BM 1-3 times a week. Hx of fibromyalgia. Hx of perforated ulcer with repair years ago by Dr Tamala Julian.     Review of Systems Past Medical History:  Diagnosis Date  . Anxiety   . Depression   . Essential hypertension, benign 12/26/2016  . Fibroadenoma of right breast    S/P LUMPECTOMY W/ RADIOACTIVE SEED IMPLANT 05-19-2015  . Fibromyalgia   . GERD (gastroesophageal reflux disease)   . History of cervical dysplasia    s/p conzation and recurrent s/p  vaginal hysterectomy 2008  . History of duodenal ulcer    1992--  S/P  REPAIR PERFORATED ULCER  . History of gastric ulcer    age 29  . Hypertension   . IBS (irritable bowel syndrome)   . VIN II (vulvar intraepithelial neoplasia II)   . Wears glasses   . Wears partial dentures    lower    Past Surgical History:  Procedure Laterality Date  . BLADDER SLING PROCEDURE  2005  approx.  . CERVICAL CONIZATION W/BX  2006  approx  . CO2 LASER APPLICATION N/A 5/57/3220   Procedure: CO2 LASER OF THE VAGINA;  Surgeon: Everitt Amber, MD;  Location: [facility];  Service: Gynecology;  Laterality: N/A;  . RADIOACTIVE SEED GUIDED EXCISIONAL BREAST BIOPSY Right 05/19/2015   Procedure: EXCISIONAL RIGHT BREAST MASS WITH RADIOACTIVE SEED LOCALIZATION;  Surgeon: Fanny Skates, MD;  Location: [facility];   Service: General;  Laterality: Right;  . REPAIR OF PERFORATED ULCER  1992   duodenal  . VAGINAL HYSTERECTOMY  11/13/2006    Allergies  Allergen Reactions  . Hydrocodone Other (See Comments)    Nausea, feels hot and insomia  . Sulfa Antibiotics Hives    Current Outpatient Prescriptions on File Prior to Visit  Medication Sig Dispense Refill  . amLODipine (NORVASC) 10 MG tablet Take 10 mg by mouth every morning.     . potassium chloride (K-DUR,KLOR-CON) 10 MEQ tablet Take 10 mEq by mouth every morning.    . traMADol (ULTRAM) 50 MG tablet Take 50 mg by mouth every 6 (six) hours as needed for moderate pain.    . valACYclovir (VALTREX) 1000 MG tablet Take 1,000 mg by mouth daily.  5   No current facility-administered medications on file prior to visit.        Objective:   Physical Exam Blood pressure 104/70, pulse 72, temperature 98 F (36.7 C), height 5\' 4"  (1.626 m), weight 108 lb 8 oz (49.2 kg).Alert and oriented. Skin warm and dry. Oral mucosa is moist.   . Sclera anicteric, conjunctivae is pink. Thyroid not enlarged. No cervical lymphadenopathy. Lungs clear. Heart regular rate and rhythm.  Abdomen is soft. Bowel sounds are positive.  No hepatomegaly. No abdominal masses felt. No tenderness.  No edema to lower extremities.           Assessment & Plan:  Epigastric pain. Needs EGD to rule out PUD.  US abdomen: for epigastric pain.

## 2017-01-01 ENCOUNTER — Ambulatory Visit (HOSPITAL_COMMUNITY): Admission: RE | Admit: 2017-01-01 | Payer: Medicaid Other | Source: Ambulatory Visit

## 2017-01-16 ENCOUNTER — Encounter (INDEPENDENT_AMBULATORY_CARE_PROVIDER_SITE_OTHER): Payer: Self-pay

## 2017-01-21 ENCOUNTER — Encounter (HOSPITAL_COMMUNITY)
Admission: EM | Disposition: A | Discharge status: Home / Self Care | Payer: Self-pay | Source: Home / Self Care | Attending: Internal Medicine

## 2017-01-21 ENCOUNTER — Inpatient Hospital Stay (HOSPITAL_COMMUNITY)
Admission: EM | Admit: 2017-01-21 | Discharge: 2017-01-24 | DRG: 380 | Disposition: A | Discharge status: Home / Self Care | Payer: Medicaid Other | Attending: Internal Medicine | Admitting: Internal Medicine

## 2017-01-21 ENCOUNTER — Encounter (HOSPITAL_COMMUNITY): Payer: Self-pay | Admitting: *Deleted

## 2017-01-21 ENCOUNTER — Emergency Department (HOSPITAL_COMMUNITY): Payer: Medicaid Other

## 2017-01-21 DIAGNOSIS — K449 Diaphragmatic hernia without obstruction or gangrene: Secondary | ICD-10-CM | POA: Diagnosis present

## 2017-01-21 DIAGNOSIS — K279 Peptic ulcer, site unspecified, unspecified as acute or chronic, without hemorrhage or perforation: Secondary | ICD-10-CM | POA: Diagnosis present

## 2017-01-21 DIAGNOSIS — K3189 Other diseases of stomach and duodenum: Secondary | ICD-10-CM | POA: Diagnosis not present

## 2017-01-21 DIAGNOSIS — Z79899 Other long term (current) drug therapy: Secondary | ICD-10-CM | POA: Diagnosis not present

## 2017-01-21 DIAGNOSIS — K5710 Diverticulosis of small intestine without perforation or abscess without bleeding: Secondary | ICD-10-CM | POA: Diagnosis present

## 2017-01-21 DIAGNOSIS — Z923 Personal history of irradiation: Secondary | ICD-10-CM

## 2017-01-21 DIAGNOSIS — Z791 Long term (current) use of non-steroidal anti-inflammatories (NSAID): Secondary | ICD-10-CM | POA: Diagnosis not present

## 2017-01-21 DIAGNOSIS — Z90710 Acquired absence of both cervix and uterus: Secondary | ICD-10-CM | POA: Diagnosis not present

## 2017-01-21 DIAGNOSIS — K920 Hematemesis: Secondary | ICD-10-CM | POA: Diagnosis not present

## 2017-01-21 DIAGNOSIS — M797 Fibromyalgia: Secondary | ICD-10-CM | POA: Diagnosis present

## 2017-01-21 DIAGNOSIS — I10 Essential (primary) hypertension: Secondary | ICD-10-CM | POA: Diagnosis present

## 2017-01-21 DIAGNOSIS — K269 Duodenal ulcer, unspecified as acute or chronic, without hemorrhage or perforation: Secondary | ICD-10-CM | POA: Diagnosis present

## 2017-01-21 DIAGNOSIS — K254 Chronic or unspecified gastric ulcer with hemorrhage: Secondary | ICD-10-CM | POA: Diagnosis present

## 2017-01-21 DIAGNOSIS — T183XXA Foreign body in small intestine, initial encounter: Secondary | ICD-10-CM | POA: Diagnosis present

## 2017-01-21 DIAGNOSIS — F418 Other specified anxiety disorders: Secondary | ICD-10-CM | POA: Diagnosis present

## 2017-01-21 DIAGNOSIS — Z87412 Personal history of vulvar dysplasia: Secondary | ICD-10-CM | POA: Diagnosis not present

## 2017-01-21 DIAGNOSIS — R1084 Generalized abdominal pain: Secondary | ICD-10-CM

## 2017-01-21 DIAGNOSIS — Z8719 Personal history of other diseases of the digestive system: Secondary | ICD-10-CM | POA: Diagnosis not present

## 2017-01-21 DIAGNOSIS — K589 Irritable bowel syndrome without diarrhea: Secondary | ICD-10-CM | POA: Diagnosis present

## 2017-01-21 DIAGNOSIS — Z87410 Personal history of cervical dysplasia: Secondary | ICD-10-CM

## 2017-01-21 DIAGNOSIS — F17210 Nicotine dependence, cigarettes, uncomplicated: Secondary | ICD-10-CM | POA: Diagnosis present

## 2017-01-21 DIAGNOSIS — Z8249 Family history of ischemic heart disease and other diseases of the circulatory system: Secondary | ICD-10-CM | POA: Diagnosis not present

## 2017-01-21 DIAGNOSIS — R000 Tachycardia, unspecified: Secondary | ICD-10-CM | POA: Diagnosis present

## 2017-01-21 DIAGNOSIS — R109 Unspecified abdominal pain: Secondary | ICD-10-CM | POA: Diagnosis not present

## 2017-01-21 DIAGNOSIS — Z8711 Personal history of peptic ulcer disease: Secondary | ICD-10-CM

## 2017-01-21 DIAGNOSIS — K315 Obstruction of duodenum: Secondary | ICD-10-CM | POA: Diagnosis not present

## 2017-01-21 DIAGNOSIS — R1013 Epigastric pain: Secondary | ICD-10-CM

## 2017-01-21 DIAGNOSIS — R112 Nausea with vomiting, unspecified: Secondary | ICD-10-CM | POA: Diagnosis not present

## 2017-01-21 DIAGNOSIS — K259 Gastric ulcer, unspecified as acute or chronic, without hemorrhage or perforation: Secondary | ICD-10-CM

## 2017-01-21 DIAGNOSIS — K219 Gastro-esophageal reflux disease without esophagitis: Secondary | ICD-10-CM | POA: Diagnosis present

## 2017-01-21 HISTORY — PX: ESOPHAGOGASTRODUODENOSCOPY: SHX5428

## 2017-01-21 LAB — HEMOGLOBIN AND HEMATOCRIT, BLOOD
HCT: 42.4 % (ref 36.0–46.0)
Hemoglobin: 14.2 g/dL (ref 12.0–15.0)

## 2017-01-21 LAB — COMPREHENSIVE METABOLIC PANEL
ALT: 13 U/L — AB (ref 14–54)
AST: 18 U/L (ref 15–41)
Albumin: 4.2 g/dL (ref 3.5–5.0)
Alkaline Phosphatase: 68 U/L (ref 38–126)
Anion gap: 10 (ref 5–15)
BUN: 17 mg/dL (ref 6–20)
CALCIUM: 9.5 mg/dL (ref 8.9–10.3)
CO2: 23 mmol/L (ref 22–32)
CREATININE: 0.86 mg/dL (ref 0.44–1.00)
Chloride: 105 mmol/L (ref 101–111)
Glucose, Bld: 99 mg/dL (ref 65–99)
Potassium: 4 mmol/L (ref 3.5–5.1)
Sodium: 138 mmol/L (ref 135–145)
Total Bilirubin: 0.4 mg/dL (ref 0.3–1.2)
Total Protein: 7.6 g/dL (ref 6.5–8.1)

## 2017-01-21 LAB — URINALYSIS, ROUTINE W REFLEX MICROSCOPIC
BILIRUBIN URINE: NEGATIVE
Glucose, UA: NEGATIVE mg/dL
KETONES UR: 5 mg/dL — AB
Leukocytes, UA: NEGATIVE
Nitrite: NEGATIVE
Protein, ur: 30 mg/dL — AB
Specific Gravity, Urine: 1.03 (ref 1.005–1.030)
pH: 5 (ref 5.0–8.0)

## 2017-01-21 LAB — PROTIME-INR
INR: 1.06
PROTHROMBIN TIME: 13.8 s (ref 11.4–15.2)

## 2017-01-21 LAB — CBC
HCT: 47.6 % — ABNORMAL HIGH (ref 36.0–46.0)
HCT: 43.6 % (ref 36.0–46.0)
HEMATOCRIT: 43.2 % (ref 36.0–46.0)
HEMOGLOBIN: 14.6 g/dL (ref 12.0–15.0)
Hemoglobin: 16.5 g/dL — ABNORMAL HIGH (ref 12.0–15.0)
Hemoglobin: 14.4 g/dL (ref 12.0–15.0)
MCH: 34.4 pg — AB (ref 26.0–34.0)
MCH: 34.4 pg — AB (ref 26.0–34.0)
MCH: 35.3 pg — AB (ref 26.0–34.0)
MCHC: 33.3 g/dL (ref 30.0–36.0)
MCHC: 33.5 g/dL (ref 30.0–36.0)
MCHC: 34.7 g/dL (ref 30.0–36.0)
MCV: 101.7 fL — AB (ref 78.0–100.0)
MCV: 102.8 fL — AB (ref 78.0–100.0)
MCV: 103.3 fL — AB (ref 78.0–100.0)
PLATELETS: 329 10*3/uL (ref 150–400)
PLATELETS: 419 10*3/uL — AB (ref 150–400)
Platelets: 413 10*3/uL — ABNORMAL HIGH (ref 150–400)
RBC: 4.18 MIL/uL (ref 3.87–5.11)
RBC: 4.24 MIL/uL (ref 3.87–5.11)
RBC: 4.68 MIL/uL (ref 3.87–5.11)
RDW: 13 % (ref 11.5–15.5)
RDW: 13.1 % (ref 11.5–15.5)
RDW: 13.2 % (ref 11.5–15.5)
WBC: 12.3 10*3/uL — AB (ref 4.0–10.5)
WBC: 8 10*3/uL (ref 4.0–10.5)
WBC: 9.9 10*3/uL (ref 4.0–10.5)

## 2017-01-21 LAB — GLUCOSE, CAPILLARY: Glucose-Capillary: 123 mg/dL — ABNORMAL HIGH (ref 65–99)

## 2017-01-21 LAB — APTT: aPTT: 26 seconds (ref 24–36)

## 2017-01-21 LAB — MAGNESIUM: Magnesium: 1.9 mg/dL (ref 1.7–2.4)

## 2017-01-21 LAB — LIPASE, BLOOD: LIPASE: 23 U/L (ref 11–51)

## 2017-01-21 LAB — POC OCCULT BLOOD, ED: FECAL OCCULT BLD: NEGATIVE

## 2017-01-21 SURGERY — EGD (ESOPHAGOGASTRODUODENOSCOPY)
Anesthesia: Moderate Sedation

## 2017-01-21 MED ORDER — PANTOPRAZOLE SODIUM 40 MG IV SOLR
INTRAVENOUS | Status: AC
  Filled 2017-01-21: qty 160

## 2017-01-21 MED ORDER — MEPERIDINE HCL 50 MG/ML IJ SOLN
INTRAMUSCULAR | Status: DC | PRN
  Administered 2017-01-21 (×2): 25 mg via INTRAVENOUS

## 2017-01-21 MED ORDER — DEXTROSE-NACL 5-0.9 % IV SOLN
INTRAVENOUS | Status: DC
  Administered 2017-01-21 – 2017-01-24 (×8): via INTRAVENOUS

## 2017-01-21 MED ORDER — PANTOPRAZOLE SODIUM 40 MG IV SOLR
40.0000 mg | Freq: Two times a day (BID) | INTRAVENOUS | Status: DC
  Administered 2017-01-24: 40 mg via INTRAVENOUS
  Filled 2017-01-21: qty 40

## 2017-01-21 MED ORDER — MIDAZOLAM HCL 5 MG/5ML IJ SOLN
INTRAMUSCULAR | Status: DC | PRN
  Administered 2017-01-21 (×3): 2 mg via INTRAVENOUS

## 2017-01-21 MED ORDER — SODIUM CHLORIDE 0.9% FLUSH
INTRAVENOUS | Status: AC
  Filled 2017-01-21: qty 10

## 2017-01-21 MED ORDER — ONDANSETRON HCL 4 MG/2ML IJ SOLN
4.0000 mg | Freq: Once | INTRAMUSCULAR | Status: DC
  Filled 2017-01-21: qty 2

## 2017-01-21 MED ORDER — ONDANSETRON HCL 4 MG/2ML IJ SOLN
4.0000 mg | Freq: Four times a day (QID) | INTRAMUSCULAR | Status: DC | PRN
  Administered 2017-01-21 – 2017-01-23 (×6): 4 mg via INTRAVENOUS
  Filled 2017-01-21 (×6): qty 2

## 2017-01-21 MED ORDER — PROMETHAZINE HCL 25 MG/ML IJ SOLN
12.5000 mg | Freq: Once | INTRAMUSCULAR | Status: AC
Start: 2017-01-21 — End: 2017-01-21
  Administered 2017-01-21: 12.5 mg via INTRAVENOUS

## 2017-01-21 MED ORDER — HYDRALAZINE HCL 20 MG/ML IJ SOLN: 10.0000 mg | Freq: Four times a day (QID) | INTRAMUSCULAR | Status: DC | PRN

## 2017-01-21 MED ORDER — PROMETHAZINE HCL 25 MG/ML IJ SOLN
INTRAMUSCULAR | Status: AC
  Filled 2017-01-21: qty 1

## 2017-01-21 MED ORDER — SODIUM CHLORIDE 0.9 % IV SOLN
INTRAVENOUS | Status: DC
  Administered 2017-01-21: 08:00:00 via INTRAVENOUS

## 2017-01-21 MED ORDER — FENTANYL CITRATE (PF) 100 MCG/2ML IJ SOLN
25.0000 ug | INTRAMUSCULAR | Status: DC | PRN
  Administered 2017-01-21 (×2): 25 ug via INTRAVENOUS
  Filled 2017-01-21 (×2): qty 2

## 2017-01-21 MED ORDER — MIDAZOLAM HCL 5 MG/5ML IJ SOLN
INTRAMUSCULAR | Status: AC
  Filled 2017-01-21: qty 10

## 2017-01-21 MED ORDER — SODIUM CHLORIDE 0.9 % IV SOLN
80.0000 mg | Freq: Once | INTRAVENOUS | Status: AC
  Administered 2017-01-21: 80 mg via INTRAVENOUS
  Filled 2017-01-21: qty 80

## 2017-01-21 MED ORDER — FENTANYL CITRATE (PF) 100 MCG/2ML IJ SOLN
50.0000 ug | Freq: Once | INTRAMUSCULAR | Status: AC
  Administered 2017-01-21: 50 ug via INTRAVENOUS
  Filled 2017-01-21: qty 2

## 2017-01-21 MED ORDER — LIDOCAINE VISCOUS 2 % MT SOLN
OROMUCOSAL | Status: DC | PRN
  Administered 2017-01-21: 1 via OROMUCOSAL

## 2017-01-21 MED ORDER — LIDOCAINE VISCOUS 2 % MT SOLN
OROMUCOSAL | Status: AC
  Filled 2017-01-21: qty 15

## 2017-01-21 MED ORDER — ONDANSETRON HCL 4 MG/2ML IJ SOLN
4.0000 mg | Freq: Once | INTRAMUSCULAR | Status: AC
  Administered 2017-01-21: 4 mg via INTRAVENOUS
  Filled 2017-01-21: qty 2

## 2017-01-21 MED ORDER — SODIUM CHLORIDE 0.9% FLUSH
3.0000 mL | Freq: Two times a day (BID) | INTRAVENOUS | Status: DC
Start: 2017-01-21 — End: 2017-01-24
  Administered 2017-01-21 – 2017-01-24 (×3): 3 mL via INTRAVENOUS

## 2017-01-21 MED ORDER — MEPERIDINE HCL 50 MG/ML IJ SOLN
INTRAMUSCULAR | Status: AC
  Filled 2017-01-21: qty 1

## 2017-01-21 MED ORDER — ONDANSETRON HCL 4 MG PO TABS
4.0000 mg | ORAL_TABLET | Freq: Four times a day (QID) | ORAL | Status: DC | PRN
  Filled 2017-01-21: qty 1

## 2017-01-21 MED ORDER — SODIUM CHLORIDE 0.9 % IV BOLUS (SEPSIS)
1000.0000 mL | Freq: Once | INTRAVENOUS | Status: AC
  Administered 2017-01-21: 1000 mL via INTRAVENOUS

## 2017-01-21 MED ORDER — MORPHINE SULFATE (PF) 2 MG/ML IV SOLN
1.0000 mg | INTRAVENOUS | Status: DC | PRN
  Administered 2017-01-21: 1 mg via INTRAVENOUS
  Administered 2017-01-21: 2 mg via INTRAVENOUS
  Administered 2017-01-21: 1 mg via INTRAVENOUS
  Administered 2017-01-21 – 2017-01-24 (×14): 2 mg via INTRAVENOUS
  Filled 2017-01-21 (×17): qty 1

## 2017-01-21 MED ORDER — SODIUM CHLORIDE 0.9 % IV SOLN
8.0000 mg/h | INTRAVENOUS | Status: AC
  Administered 2017-01-21 – 2017-01-23 (×7): 8 mg/h via INTRAVENOUS
  Filled 2017-01-21 (×9): qty 80

## 2017-01-21 MED ORDER — SODIUM CHLORIDE 0.9 % IV BOLUS (SEPSIS)
500.0000 mL | Freq: Once | INTRAVENOUS | Status: AC
  Administered 2017-01-21: 500 mL via INTRAVENOUS

## 2017-01-21 MED ORDER — SIMETHICONE 40 MG/0.6ML PO SUSP
ORAL | Status: AC
  Filled 2017-01-21: qty 30

## 2017-01-21 NOTE — ED Triage Notes (Signed)
Pt brought in by [facility] for c/o abdominal pain with vomiting x 2 days; pt states the vomit looks life coffee grounds and pt has a hx of ulcers; pt states the pain is generalized and radiates around to her back and up to her shoulder blades

## 2017-01-21 NOTE — ED Provider Notes (Addendum)
[facility] DEPT Provider Note   CSN: [id number] Arrival date & time: 01/21/17  0006  By signing my name below, I, Dolores Hoose, attest that this documentation has been prepared under the direction and in the presence of Rolland Porter, MD . Electronically Signed: Dolores Hoose, Scribe. 01/21/2017. 12:59 AM.  Time Seen: 3329  History   Chief Complaint Chief Complaint  Patient presents with  . Abdominal Pain   The history is provided by the patient. No language interpreter was used.   HPI Comments:  [name] is a 51 y.o. female with pmhx of GERD and stomach ulcers who presents to the Emergency Department complaining of intermittent, mild abdominal pain onset 7 days. She describes her symptoms as a sharp pain located epigastrically that lasts about 24 hours exacerbated with eating. No alleviating factors indicated. She reports associated lower back pain, dizziness, diaphoresis and sore throat. She started vomiting yesterday and had 2 episodes yesterday and 2 episodes tonight. She states the vomitus has acid in it. She does not describe any other reflux symptoms other than while she is vomiting. She states with the last episode there was brown material in the vomitus like "coffee grounds". Denies melena, bloody stool or bright red vomit. Pt is compliant with daily ? protonix for her stomach ulcers (she states her PCP changed her from the protonix to another "P" acid medication about 6 months ago due to having reflux symptoms and those symptoms improved) and is followed by Dr. Laural Golden, GI  for her symptoms. She states he is planning to do endoscopy again in  July. Pt is an everyday smoker and does not drink. She has a previous abdominal hx of perforated ulcer repair over 10-15 years ago. She relates yesterday she was having diaphoretic episodes for no reason. She also complains of a lot of lower back pain. She was evaluated by her PCP and actually had a CT of her abdomen done recently at [facility] to look for gallstones.Marland Kitchen   PCP Celedonio Savage, MD   Past Medical History:  Diagnosis Date  . Anxiety   . Depression   . Essential hypertension, benign 12/26/2016  . Fibroadenoma of right breast    S/P LUMPECTOMY W/ RADIOACTIVE SEED IMPLANT 05-19-2015  . Fibromyalgia   . GERD (gastroesophageal reflux disease)   . History of cervical dysplasia    s/p conzation and recurrent s/p  vaginal hysterectomy 2008  . History of duodenal ulcer    1992--  S/P  REPAIR PERFORATED ULCER  . History of gastric ulcer    age 86  . Hypertension   . IBS (irritable bowel syndrome)   . VIN II (vulvar intraepithelial neoplasia II)   . Wears glasses   . Wears partial dentures    lower    Patient Active Problem List   Diagnosis Date Noted  . Abdominal pain 01/21/2017  . History of gastric ulcer 01/21/2017  . Hypertension 01/21/2017  . Fibromyalgia 01/21/2017  . Essential hypertension, benign 12/26/2016  . Abdominal pain, epigastric 12/26/2016  . 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 Fibroadenoma of right breast 05/19/2015    Past Surgical History:  Procedure Laterality Date  . BLADDER SLING PROCEDURE  2005  approx.  . CERVICAL CONIZATION W/BX  2006  approx  . CO2 LASER APPLICATION N/A 02/08/8415   Procedure: CO2 LASER OF THE VAGINA;  Surgeon: Everitt Amber, MD;  Location: [facility];  Service: Gynecology;  Laterality: N/A;  . RADIOACTIVE SEED  GUIDED EXCISIONAL BREAST BIOPSY Right 05/19/2015   Procedure: EXCISIONAL RIGHT BREAST MASS WITH RADIOACTIVE SEED LOCALIZATION;  Surgeon: Fanny Skates, MD;  Location: [facility];  Service: General;  Laterality: Right;  . REPAIR OF PERFORATED ULCER  1992   duodenal  . VAGINAL HYSTERECTOMY  11/13/2006    OB History    No data available       Home Medications    Prior to Admission medications   Medication Sig Start Date End Date Taking? Authorizing Provider  amLODipine (NORVASC) 10 MG  tablet Take 10 mg by mouth every morning.     Historical Provider, MD  LYSINE PO Take by mouth.    Historical Provider, MD  Multiple Vitamins-Minerals (CENTRUM ADULTS) TABS Take by mouth.    Historical Provider, MD  pantoprazole (PROTONIX) 40 MG tablet Take 40 mg by mouth daily.    Historical Provider, MD  potassium chloride (K-DUR,KLOR-CON) 10 MEQ tablet Take 10 mEq by mouth every morning.    Historical Provider, MD  traMADol (ULTRAM) 50 MG tablet Take 50 mg by mouth every 6 (six) hours as needed for moderate pain.    Historical Provider, MD  valACYclovir (VALTREX) 1000 MG tablet Take 1,000 mg by mouth daily. 07/26/16   Historical Provider, MD    Family History Family History  Problem Relation Age of Onset  . Hypertension Mother   . Clotting disorder Father   . Diabetes Father   . Hypertension Father   . Hypertension Sister   . Hypertension Brother   . Hypertension Maternal Aunt   . Hypertension Maternal Uncle   . Hypertension Paternal Aunt   . Hypertension Paternal Uncle     Social History Social History  Substance Use Topics  . Smoking status: Current Every Day Smoker    Packs/day: 1.00    Years: 18.00    Types: Cigarettes  . Smokeless tobacco: Never Used  . Alcohol use No  smokes 1 to 1 1/2 ppd employed   Allergies   Hydrocodone and Sulfa antibiotics   Review of Systems Review of Systems  Constitutional: Positive for diaphoresis.  HENT: Positive for sore throat.   Gastrointestinal: Positive for abdominal pain. Negative for blood in stool.       Negative for Hematemesis Positive for "Coffee ground" emesis  Musculoskeletal: Positive for back pain.  Neurological: Positive for dizziness.  All other systems reviewed and are negative.    Physical Exam Updated Vital Signs BP 135/86 (BP Location: Left Arm)   Pulse 92   Temp 98.6 F (37 C) (Oral)   Resp 18   Ht 5\' 4"  (1.626 m)   Wt 106 lb (48.1 kg)   SpO2 99%   BMI 18.19 kg/m   Vital signs normal     Physical Exam  Constitutional: No distress.  Thin female  HENT:  Head: Normocephalic and atraumatic.  Eyes: Conjunctivae are normal.  Neck: Neck supple.  Cardiovascular: Normal rate and regular rhythm.   No murmur heard. Pulmonary/Chest: Effort normal and breath sounds normal. No respiratory distress.  Abdominal: Soft. There is generalized tenderness.    Diminished bowel sounds. Generalized tenderness worse in epigastric area.   Musculoskeletal: She exhibits no edema.  Neurological: She is alert.  Skin: Skin is warm and dry.  Psychiatric: She has a normal mood and affect.  Nursing note and vitals reviewed.    ED Treatments / Results  Labs (all labs ordered are listed, but only abnormal results are displayed) Results for orders placed or performed  during the hospital encounter of 01/21/17  Lipase, blood  Result Value Ref Range   Lipase 23 11 - 51 U/L  Comprehensive metabolic panel  Result Value Ref Range   Sodium 138 135 - 145 mmol/L   Potassium 4.0 3.5 - 5.1 mmol/L   Chloride 105 101 - 111 mmol/L   CO2 23 22 - 32 mmol/L   Glucose, Bld 99 65 - 99 mg/dL   BUN 17 6 - 20 mg/dL   Creatinine, Ser 0.86 0.44 - 1.00 mg/dL   Calcium 9.5 8.9 - 10.3 mg/dL   Total Protein 7.6 6.5 - 8.1 g/dL   Albumin 4.2 3.5 - 5.0 g/dL   AST 18 15 - 41 U/L   ALT 13 (L) 14 - 54 U/L   Alkaline Phosphatase 68 38 - 126 U/L   Total Bilirubin 0.4 0.3 - 1.2 mg/dL   GFR calc non Af Amer >60 >60 mL/min   GFR calc Af Amer >60 >60 mL/min   Anion gap 10 5 - 15  CBC  Result Value Ref Range   WBC 12.3 (H) 4.0 - 10.5 K/uL   RBC 4.68 3.87 - 5.11 MIL/uL   Hemoglobin 16.5 (H) 12.0 - 15.0 g/dL   HCT 47.6 (H) 36.0 - 46.0 %   MCV 101.7 (H) 78.0 - 100.0 fL   MCH 35.3 (H) 26.0 - 34.0 pg   MCHC 34.7 30.0 - 36.0 g/dL   RDW 13.0 11.5 - 15.5 %   Platelets 419 (H) 150 - 400 K/uL  Urinalysis, Routine w reflex microscopic  Result Value Ref Range   Color, Urine AMBER (A) YELLOW   APPearance HAZY (A) CLEAR    Specific Gravity, Urine 1.030 1.005 - 1.030   pH 5.0 5.0 - 8.0   Glucose, UA NEGATIVE NEGATIVE mg/dL   Hgb urine dipstick SMALL (A) NEGATIVE   Bilirubin Urine NEGATIVE NEGATIVE   Ketones, ur 5 (A) NEGATIVE mg/dL   Protein, ur 30 (A) NEGATIVE mg/dL   Nitrite NEGATIVE NEGATIVE   Leukocytes, UA NEGATIVE NEGATIVE   RBC / HPF 0-5 0 - 5 RBC/hpf   WBC, UA 0-5 0 - 5 WBC/hpf   Bacteria, UA RARE (A) NONE SEEN   Squamous Epithelial / LPF 0-5 (A) NONE SEEN   Mucous PRESENT    Granular Casts, UA PRESENT   Protime-INR  Result Value Ref Range   Prothrombin Time 13.8 11.4 - 15.2 seconds   INR 1.06   APTT  Result Value Ref Range   aPTT 26 24 - 36 seconds  POC occult blood, ED RN will collect  Result Value Ref Range   Fecal Occult Bld NEGATIVE NEGATIVE   Laboratory interpretation all normal except persistently elevated hemoglobin most likely from her smoking, leukocytosis, elevated indices consistent with vitamin J-24 or Folic Acid deficiency    EKG  EKG Interpretation None       Radiology No results found.   01/20/2017 patient had acute abdominal x-rays with one view chest done which showed moderately increased colonic stool burden likely reflects constipation. No evidence of obstruction or perforation, no acute pulmonary abnormality.  01/08/2017 patient had CT scan of the abdomen/pelvis without contrast. Impression was no nephrolithiasis, no hydronephrosis or hydroureter, no calcified ureteral calculi. Limited assessment of urinary bladder which is empty. Moderate stool noted in right colon, normal appendix, no pericecal inflammation, moderate stool within transverse colon. Significant redundant transverse colon. No evidence of colitis or diverticulitis. Surgically absent uterus, cyst/follicle within left ovary measures 2.7 cm. Second  cyst/follicle within left ovary measures 2.1 cm, no pelvic free fluid.          Procedures Procedures (including critical care time)  Medications  Ordered in ED Medications  pantoprazole (PROTONIX) 80 mg in sodium chloride 0.9 % 250 mL (0.32 mg/mL) infusion (8 mg/hr Intravenous New Bag/Given 01/21/17 0207)  pantoprazole (PROTONIX) injection 40 mg (not administered)  sodium chloride 0.9 % bolus 1,000 mL (1,000 mLs Intravenous New Bag/Given 01/21/17 0134)  sodium chloride 0.9 % bolus 500 mL (0 mLs Intravenous Stopped 01/21/17 0207)  pantoprazole (PROTONIX) 80 mg in sodium chloride 0.9 % 100 mL IVPB (0 mg Intravenous Stopped 01/21/17 0207)  ondansetron (ZOFRAN) injection 4 mg (4 mg Intravenous Given 01/21/17 0135)  fentaNYL (SUBLIMAZE) injection 50 mcg (50 mcg Intravenous Given 01/21/17 0137)     Initial Impression / Assessment and Plan / ED Course  I have reviewed the triage vital signs and the nursing notes.  Pertinent labs & imaging results that were available during my care of the patient were reviewed by me and considered in my medical decision making (see chart for details).  DIAGNOSTIC STUDIES:  Oxygen Saturation is 99% on RA, normal by my interpretation.    COORDINATION OF CARE:  1:13 AM Discussed treatment plan with pt at bedside which includes consult with on-call personnel with Dr. Laural Golden and pt agreed to plan.I had reviewed patient's x-ray studies done at another facility. At this point I did not feel a repeat CT scan would give [place] any useful information. Patient was given IV fluids, IV nausea and pain medicine. She was started on a Protonix bolus and drip because of her coffee grounds she may have a duodenal ulcer that is bleeding. However her hemoglobin and her vital signs are stable.. I am going to talk to her gastroenterologist or the one on-call to see if we need to repeat the CT scan however more than likely they will just want to do endoscopy in the morning. However I talked to the patient if she has significant bleeding during the night they may need to do it emergently.  1:26 AM and Dr. Laural Golden, GI, agrees that CT scan is not  indicated tonight, he asked to keep patient nothing by mouth and he will do endoscopy in the morning.  1:50 AM Dr. Marin Comment, hospitalist will admit patient.       Final Clinical Impressions(s) / ED Diagnoses   Final diagnoses:  PUD (peptic ulcer disease)  Coffee ground emesis  Generalized abdominal pain   Plan admission  Rolland Porter, MD, FACEP  I personally performed the services described in this documentation, which was scribed in my presence. The recorded information has been reviewed and considered.  Rolland Porter, MD, Barbette Or, MD 01/21/17 [place], MD 01/21/17 2595

## 2017-01-21 NOTE — Consult Note (Signed)
Referring Provider: Orvan Falconer, MD Primary Care Physician:  Celedonio Savage, MD Primary Gastroenterologist:  Dr. Laural Golden  Reason for Consultation:   Upper abdominal pain and coffee-ground emesis.  HPI:   Patient is 51 year old Caucasian female was history of peptic ulcer disease dating back to early 1990s when she had surgery for perforated peptic ulcer disease. She also has history of GERD. For the last few months she's been having frequent episodes of heartburn. She was seen by her primary care physician Dr. Celedonio Savage and PPI was changed from omeprazole to pantoprazole. However she did not see any improvement.  She states she got sick after eating a bag of popcorn to about 6 days ago. She developed severe upper abdominal pain radiating to her chest associated with nausea and vomiting. Since then she's been vomiting almost every day. She is also having daily heartburn. Yesterday she had two episodes of coffee-ground emesis. She says she has lost 6 pounds in one week. She denies melena or rectal bleeding. She has been taking BC/Goody powders 3-4 times a week until her acute symptoms started. She does not take other OTC NSAIDs. She does not know if she is here been check for H. pylori infection. She had abdominopelvic CT on 01/08/2017 at [facility] and was unremarkable. She states she also had upper abdominal ultrasound was negative but I cannot find the report in the system. She went to emergency room at [facility] yesterday but after waiting a while she decided to come. Review of the systems is negative for fever or chills. Family history is positive for peptic ulcer disease and both parents and 2 brothers. And 2 sisters. Her third sister is diabetic but does not have peptic ulcer disease. Father died at 44 because of multiple medical problems. He was diabetic and had severe PVD. Patient is divorced. She has a daughter age 50 who possibly has IBS. Patient is self-employed. She has been working as Building control surveyor for 20  years. She smokes 1-1-1/2 pack per day. She smoking for 29 years. She does not drink alcohol.    Past Medical History:  Diagnosis Date  . Anxiety   . Depression   . Essential hypertension, benign 12/26/2016  . Fibroadenoma of right breast    S/P LUMPECTOMY W/ RADIOACTIVE SEED IMPLANT 05-19-2015  . Fibromyalgia   . GERD (gastroesophageal reflux disease)   . History of cervical dysplasia    s/p conzation and recurrent s/p  vaginal hysterectomy 2008  . History of duodenal ulcer    1992--  S/P  REPAIR PERFORATED ULCER  . History of gastric ulcer    age 25  . Hypertension   . IBS (irritable bowel syndrome)   . VIN II (vulvar intraepithelial neoplasia II)   . Wears glasses   . Wears partial dentures    lower    Past Surgical History:  Procedure Laterality Date  . BLADDER SLING PROCEDURE  2005  approx.  . CERVICAL CONIZATION W/BX  2006  approx  . CO2 LASER APPLICATION N/A 4/40/3474   Procedure: CO2 LASER OF THE VAGINA;  Surgeon: Everitt Amber, MD;  Location: [facility];  Service: Gynecology;  Laterality: N/A;  . RADIOACTIVE SEED GUIDED EXCISIONAL BREAST BIOPSY Right 05/19/2015   Procedure: EXCISIONAL RIGHT BREAST MASS WITH RADIOACTIVE SEED LOCALIZATION;  Surgeon: Fanny Skates, MD;  Location: [facility];  Service: General;  Laterality: Right;  . REPAIR OF PERFORATED ULCER  1992   duodenal  . VAGINAL HYSTERECTOMY  11/13/2006    Prior  to Admission medications   Medication Sig Start Date End Date Taking? Authorizing Provider  amLODipine (NORVASC) 10 MG tablet Take 10 mg by mouth every morning.     Historical Provider, MD  LYSINE PO Take by mouth.    Historical Provider, MD  Multiple Vitamins-Minerals (CENTRUM ADULTS) TABS Take by mouth.    Historical Provider, MD  pantoprazole (PROTONIX) 40 MG tablet Take 40 mg by mouth daily.    Historical Provider, MD  potassium chloride (K-DUR,KLOR-CON) 10 MEQ tablet Take 10 mEq by mouth every morning.    Historical  Provider, MD  traMADol (ULTRAM) 50 MG tablet Take 50 mg by mouth every 6 (six) hours as needed for moderate pain.    Historical Provider, MD  valACYclovir (VALTREX) 1000 MG tablet Take 1,000 mg by mouth daily. 07/26/16   Historical Provider, MD    Current Facility-Administered Medications  Medication Dose Route Frequency Provider Last Rate Last Dose  . dextrose 5 %-0.9 % sodium chloride infusion   Intravenous Continuous Belkys A Regalado, MD 125 mL/hr at 01/21/17 0411    . morphine 2 MG/ML injection 1-2 mg  1-2 mg Intravenous Q4H PRN Belkys A Regalado, MD      . ondansetron (ZOFRAN) tablet 4 mg  4 mg Oral Q6H PRN Orvan Falconer, MD       Or  . ondansetron [facility]) injection 4 mg  4 mg Intravenous Q6H PRN Orvan Falconer, MD   4 mg at 01/21/17 0351  . pantoprazole (PROTONIX) 80 mg in sodium chloride 0.9 % 250 mL (0.32 mg/mL) infusion  8 mg/hr Intravenous Continuous Rolland Porter, MD 25 mL/hr at 01/21/17 0207 8 mg/hr at 01/21/17 0207  . [START ON 01/24/2017] pantoprazole (PROTONIX) injection 40 mg  40 mg Intravenous Q12H Rolland Porter, MD      . sodium chloride flush (NS) 0.9 % injection 3 mL  3 mL Intravenous Q12H Orvan Falconer, MD        Allergies as of 01/21/2017 - Review Complete 01/21/2017  Allergen Reaction Noted  . Hydrocodone Other (See Comments) 05/15/2015  . Sulfa antibiotics Hives 09/29/2013    Family History  Problem Relation Age of Onset  . Hypertension Mother   . Clotting disorder Father   . Diabetes Father   . Hypertension Father   . Hypertension Sister   . Hypertension Brother   . Hypertension Maternal Aunt   . Hypertension Maternal Uncle   . Hypertension Paternal Aunt   . Hypertension Paternal Uncle     Social History   Social History  . Marital status: Single    Spouse name: N/A  . Number of children: N/A  . Years of education: N/A   Occupational History  . Not on file.   Social History Main Topics  . Smoking status: Current Every Day Smoker    Packs/day: 1.00    Years: 18.00     Types: Cigarettes  . Smokeless tobacco: Never Used  . Alcohol use No  . Drug use: No  . Sexual activity: Yes    Birth control/ protection: Surgical   Other Topics Concern  . Not on file   Social History Narrative  . No narrative on file    Review of Systems: See HPI, otherwise normal ROS  Physical Exam: Temp:  [98.4 F (36.9 C)-98.6 F (37 C)] 98.4 F (36.9 C) (05/01 0339) Pulse Rate:  [92-98] 98 (05/01 0339) Resp:  [18-20] 18 (05/01 0339) BP: (109-135)/(73-86) 118/74 (05/01 0339) SpO2:  [97 %-99 %] 98 % (05/01  8675) Weight:  [106 lb (48.1 kg)-109 lb 14.4 oz (49.9 kg)] 109 lb 14.4 oz (49.9 kg) (05/01 0339) Last BM Date: 01/20/17  Well-developed thin Caucasian female in NAD. She prefers to stay in propp position because she has more pain when she is supine. Conjunctiva is pink. Sclera is nonicteric. Oropharyngeal mucosa is unremarkable. No neck masses or thyromegaly noted. Cardiac exam with regular rhythm normal S1 and S2. No murmur or gallop noted. Lungs are clear to auscultation. Abdomen is flat. She has right subcostal scar. Bowel sounds are normal. On palpation abdomen is soft with moderate tenderness midepigastric region. Mild tenderness noted below the costal margins. No organomegaly or masses. Rectal examination deferred. Stool guaiac in ER was negative. No clubbing or peripheral edema noted.   Lab Results:  Recent Labs  01/21/17 0057 01/21/17 0358  WBC 12.3* 9.9  HGB 16.5* 14.6  HCT 47.6* 43.6  PLT 419* 413*   BMET  Recent Labs  01/21/17 0057  NA 138  K 4.0  CL 105  CO2 23  GLUCOSE 99  BUN 17  CREATININE 0.86  CALCIUM 9.5   LFT  Recent Labs  01/21/17 0057  PROT 7.6  ALBUMIN 4.2  AST 18  ALT 13*  ALKPHOS 68  BILITOT 0.4   Serum lipase 23.  PT/INR  Recent Labs  01/21/17 0057  LABPROT 13.8  INR 1.06    Studies/Results: Dg Chest Port 1 View  Result Date: 01/21/2017 CLINICAL DATA:  Abdominal pain tonight.  Minimal  coughing. EXAM: PORTABLE CHEST 1 VIEW COMPARISON:  04/09/2016 FINDINGS: A single AP portable view of the chest demonstrates no focal airspace consolidation or alveolar edema. The lungs are grossly clear. There is no large effusion or pneumothorax. Cardiac and mediastinal contours appear unremarkable. IMPRESSION: No active disease. Electronically Signed   By: Andreas Newport M.D.   On: 01/21/2017 03:28   Abdominopelvic CT images from 01/08/2017 reviewed. No evidence of pancreatitis or gastric wall thickening. Abundant stool in the colon.  Assessment;  Patient is 51 year old Caucasian female was history of peptic ulcer disease dating back to early 90s when she required surgery for perforated peptic ulcer disease. It does not appear that she had any other intervention such as a vagotomy or antrectomy. She now presents with several week history of heartburn unresponsive therapy and 6 day history of worsening epigastric pain nausea and vomiting and now with coffee-ground emesis. He has been using BC/Goody powder on regular basis until her pain started. H&H is normal. Suspect she has recurrent peptic ulcer disease secondary to chronic NSAID use. H pylori status is unknown. Patient is on IV pantoprazole.  Recommendations;  Diagnostic esophagogastroduodenoscopy today. Procedure and risks reviewed with the patient and she is agreeable. Will try to obtain copy of recent ultrasound report.   LOS: 0 days   Delquan Poucher  01/21/2017, 7:58 AM

## 2017-01-21 NOTE — Progress Notes (Signed)
Notified Dr. Laural Golden that the patient called her MD office and learned that the test she had was not and U/S but a abdominal xray.  Verified if the MD wanted the patient to be on suction or just clamped with the NGT.  New orders given and followed.

## 2017-01-21 NOTE — H&P (Signed)
History and Physical    [name] IDP:[id number] DOB: May 21, 1966 DOA: 01/21/2017  PCP: Celedonio Savage, MD  Patient coming from: Home.    Chief Complaint:  Abdominal pain, nausea, vomiting dark blood.   HPI: [name] is an 51 y.o. female with hx of anxiety depression, prior DU and gastric ulcer, hx of perforated DU s/p repair, fibromyalgia, presented to the ER with increasing abdominal pain, nausea, vomiting coffee ground followed by old blood.  She had an abdominal CT on April 18, which was negative except for increase stool burden.  She has been having symptoms for at least a month, and has seen GI, Dr Dereck Leep and he planned to have an EGD performed on her.  She denied black or bloody stool, taking NSAIDS or steroids.  She does smoke with denied alcohol use.  Work up in the ER showed Hb of 16 g per dL, and normal BUN.  Her Stool Guaiac is negative.  She has no SOB or CP.  EDP spoke with Dr Dereck Leep tonight, and he would like to proceed with EGD later today.  Hospitalist was asked to admit her for further evaluation.   ED Course:  See above.  Rewiew of Systems:  Constitutional: Negative for malaise, fever and chills. No significant weight loss or weight gain Eyes: Negative for eye pain, redness and discharge, diplopia, visual changes, or flashes of light. ENMT: Negative for ear pain, hoarseness, nasal congestion, sinus pressure and sore throat. No headaches; tinnitus, drooling, or problem swallowing. Cardiovascular: Negative for chest pain, palpitations, diaphoresis, dyspnea and peripheral edema. ; No orthopnea, PND Respiratory: Negative for cough, hemoptysis, wheezing and stridor. No pleuritic chestpain. Gastrointestinal: Negative for diarrhea, constipation,  melena, blood in stool, aundice and rectal bleeding.    Genitourinary: Negative for frequency, dysuria, incontinence,flank pain and hematuria; Musculoskeletal: Negative for back pain and neck pain. Negative for swelling and trauma.;    Skin: . Negative for pruritus, rash, abrasions, bruising and skin lesion.; ulcerations Neuro: Negative for headache, lightheadedness and neck stiffness. Negative for weakness, altered level of consciousness , altered mental status, extremity weakness, burning feet, involuntary movement, seizure and syncope.  Psych: negative for anxiety, depression, insomnia, tearfulness, panic attacks, hallucinations, paranoia, suicidal or homicidal ideation    Past Medical History:  Diagnosis Date  . Anxiety   . Depression   . Essential hypertension, benign 12/26/2016  . Fibroadenoma of right breast    S/P LUMPECTOMY W/ RADIOACTIVE SEED IMPLANT 05-19-2015  . Fibromyalgia   . GERD (gastroesophageal reflux disease)   . History of cervical dysplasia    s/p conzation and recurrent s/p  vaginal hysterectomy 2008  . History of duodenal ulcer    1992--  S/P  REPAIR PERFORATED ULCER  . History of gastric ulcer    age 56  . Hypertension   . IBS (irritable bowel syndrome)   . VIN II (vulvar intraepithelial neoplasia II)   . Wears glasses   . Wears partial dentures    lower    Past Surgical History:  Procedure Laterality Date  . BLADDER SLING PROCEDURE  2005  approx.  . CERVICAL CONIZATION W/BX  2006  approx  . CO2 LASER APPLICATION N/A 4/43/1540   Procedure: CO2 LASER OF THE VAGINA;  Surgeon: Everitt Amber, MD;  Location: [facility];  Service: Gynecology;  Laterality: N/A;  . RADIOACTIVE SEED GUIDED EXCISIONAL BREAST BIOPSY Right 05/19/2015   Procedure: EXCISIONAL RIGHT BREAST MASS WITH RADIOACTIVE SEED LOCALIZATION;  Surgeon: Fanny Skates, MD;  Location: [facility];  Service: General;  Laterality: Right;  . REPAIR OF PERFORATED ULCER  1992   duodenal  . VAGINAL HYSTERECTOMY  11/13/2006     reports that she has been smoking Cigarettes.  She has a 18.00 pack-year smoking history. She has never used smokeless tobacco. She reports that she does not drink alcohol or use  drugs.  Allergies  Allergen Reactions  . Hydrocodone Other (See Comments)    Nausea, feels hot and insomia  . Sulfa Antibiotics Hives    Family History  Problem Relation Age of Onset  . Hypertension Mother   . Clotting disorder Father   . Diabetes Father   . Hypertension Father   . Hypertension Sister   . Hypertension Brother   . Hypertension Maternal Aunt   . Hypertension Maternal Uncle   . Hypertension Paternal Aunt   . Hypertension Paternal Uncle      Prior to Admission medications   Medication Sig Start Date End Date Taking? Authorizing Provider  amLODipine (NORVASC) 10 MG tablet Take 10 mg by mouth every morning.     Historical Provider, MD  LYSINE PO Take by mouth.    Historical Provider, MD  Multiple Vitamins-Minerals (CENTRUM ADULTS) TABS Take by mouth.    Historical Provider, MD  pantoprazole (PROTONIX) 40 MG tablet Take 40 mg by mouth daily.    Historical Provider, MD  potassium chloride (K-DUR,KLOR-CON) 10 MEQ tablet Take 10 mEq by mouth every morning.    Historical Provider, MD  traMADol (ULTRAM) 50 MG tablet Take 50 mg by mouth every 6 (six) hours as needed for moderate pain.    Historical Provider, MD  valACYclovir (VALTREX) 1000 MG tablet Take 1,000 mg by mouth daily. 07/26/16   Historical Provider, MD    Physical Exam: Vitals:   01/21/17 0008 01/21/17 0010 01/21/17 0100 01/21/17 0209  BP:  135/86 117/81 109/73  Pulse:  92 98 98  Resp:  18  20  Temp:  98.6 F (37 C)    TempSrc:  Oral    SpO2:  99% 97% 98%  Weight: 48.1 kg (106 lb)     Height: 5\' 4"  (1.626 m)         Constitutional: NAD, calm, comfortable Vitals:   01/21/17 0008 01/21/17 0010 01/21/17 0100 01/21/17 0209  BP:  135/86 117/81 109/73  Pulse:  92 98 98  Resp:  18  20  Temp:  98.6 F (37 C)    TempSrc:  Oral    SpO2:  99% 97% 98%  Weight: 48.1 kg (106 lb)     Height: 5\' 4"  (1.626 m)      Eyes: PERRL, lids and conjunctivae normal ENMT: Mucous membranes are moist. Posterior  pharynx clear of any exudate or lesions.Normal dentition.  Neck: normal, supple, no masses, no thyromegaly Respiratory: clear to auscultation bilaterally, no wheezing, no crackles. Normal respiratory effort. No accessory muscle use.  Cardiovascular: Regular rate and rhythm, no murmurs / rubs / gallops. No extremity edema. 2+ pedal pulses. No carotid bruits.  Abdomen: no tenderness, no masses palpated. No hepatosplenomegaly. Bowel sounds positive.  Musculoskeletal: no clubbing / cyanosis. No joint deformity upper and lower extremities. Good ROM, no contractures. Normal muscle tone.  Skin: no rashes, lesions, ulcers. No induration Neurologic: CN 2-12 grossly intact. Sensation intact, DTR normal. Strength 5/5 in all 4.  Psychiatric: Normal judgment and insight. Alert and oriented x 3. Normal mood.   Labs on Admission: I have personally reviewed following labs and  imaging studies CBC:  Recent Labs Lab 01/21/17 0057  WBC 12.3*  HGB 16.5*  HCT 47.6*  MCV 101.7*  PLT 950*   Basic Metabolic Panel:  Recent Labs Lab 01/21/17 0057  NA 138  K 4.0  CL 105  CO2 23  GLUCOSE 99  BUN 17  CREATININE 0.86  CALCIUM 9.5   GFR: Estimated Creatinine Clearance: 60.1 mL/min (by C-G formula based on SCr of 0.86 mg/dL). Liver Function Tests:  Recent Labs Lab 01/21/17 0057  AST 18  ALT 13*  ALKPHOS 68  BILITOT 0.4  PROT 7.6  ALBUMIN 4.2    Recent Labs Lab 01/21/17 0057  LIPASE 23   Coagulation Profile:  Recent Labs Lab 01/21/17 0057  INR 1.06   Urine analysis:    Component Value Date/Time   COLORURINE AMBER (A) 01/21/2017 0012   APPEARANCEUR HAZY (A) 01/21/2017 0012   LABSPEC 1.030 01/21/2017 0012   PHURINE 5.0 01/21/2017 0012   GLUCOSEU NEGATIVE 01/21/2017 0012   HGBUR SMALL (A) 01/21/2017 0012   BILIRUBINUR NEGATIVE 01/21/2017 0012   KETONESUR 5 (A) 01/21/2017 0012   PROTEINUR 30 (A) 01/21/2017 0012   UROBILINOGEN 0.2 04/15/2014 1238   NITRITE NEGATIVE 01/21/2017  0012   LEUKOCYTESUR NEGATIVE 01/21/2017 0012   Assessment/Plan Active Problems:   Abdominal pain   History of gastric ulcer   Hypertension   Fibromyalgia   PLAN:  PUD:  I suspect she has gastritis, gastric or duodenal ulcer.  I don't think she has significant UGIB.  Her Hb is high, BUN is normal, and stool guaic is negative.  Will continue with IV PPI drip to encourage clot formation.  Will make her NPO and procced with EGD as per Dr Ewell Poe plan. Will obtain KUB to be sure there is no air under the diaphram.   HTN:  Will hold her anti HTN meds.  Fibromyalgia:  She asked to pain meds for her abdominal pain.  Will give IV Fentanyl judiciously, if her BP will improve.   DVT prophylaxis: SCD.  Code Status: FULL CODE.  Family Communication: Sister at bedside.  Disposition Plan: home  Consults called: GI-- Dr Dereck Leep.  Admission status: OBS.    Eneida Evers MD FACP. [facility]  If 7PM-7AM, please contact night-coverage [email or web address] Password [email or web address]  01/21/2017, 2:14 AM

## 2017-01-21 NOTE — Op Note (Signed)
[facility] Patient Name: [name] Procedure Date: 01/21/2017 8:25 AM MRN: [id number] Date of Birth: 1966-05-10 Attending MD: Hildred Laser , MD CSN: [id number] Age: 51 Admit Type: Inpatient Procedure:                Upper GI endoscopy Indications:              Coffee-ground emesis, Nausea with vomiting Providers:                Hildred Laser, MD, Janeece Riggers, RN, Randa Spike,                            Technician Referring MD:             Orvan Falconer, MD Medicines:                Lidocaine spray, Promethazine 12.5 mg IV,                            Meperidine 50 mg IV, Midazolam 6 mg IV Complications:            No immediate complications. Estimated Blood Loss:     Estimated blood loss: none. Procedure:                Pre-Anesthesia Assessment:                           - Prior to the procedure, a History and Physical                            was performed, and patient medications and                            allergies were reviewed. The patient's tolerance of                            previous anesthesia was also reviewed. The risks                            and benefits of the procedure and the sedation                            options and risks were discussed with the patient.                            All questions were answered, and informed consent                            was obtained. Prior Anticoagulants: The patient                            last took previous NSAID medication 6 days prior to                            the procedure. ASA Grade Assessment: II - A patient  with mild systemic disease. After reviewing the                            risks and benefits, the patient was deemed in                            satisfactory condition to undergo the procedure.                           After obtaining informed consent, the endoscope was                            passed under direct vision. Throughout the          procedure, the patient's blood pressure, pulse, and                            oxygen saturations were monitored continuously. The                            EG-[id number] ([id number]) scope was introduced through the                            mouth, and advanced to the duodenal bulb. The upper                            GI endoscopy was accomplished without difficulty.                            The patient tolerated the procedure well. The                            EG-[id number] ([id number]) scope was introduced through the                            mouth, and advanced to the duodenal bulb. The                            patient tolerated the procedure poorly due to the                            patient's inability to cooperate. Scope In: 8:41:28 AM Scope Out: 8:46:54 AM Total Procedure Duration: 0 hours 5 minutes 26 seconds  Findings:      The examined esophagus was normal.      The Z-line was regular.      A medium amount of food (residue) was found in the gastric fundus and in       the gastric body.      One cratered gastric ulcer with no stigmata of bleeding was found in the       prepyloric region of the stomach. The lesion was 5 mm in largest       dimension.      The cardia and pylorus were normal.      One non-bleeding cratered duodenal ulcer with no stigmata of bleeding  was found in the duodenal bulb. The lesion was large lcer involving more       t of the circumference mm in largest dimension.      An acquired benign-appearing, intrinsic severe stenosis was found at       junction between the first and second portion of the duodenum and was       non-traversed. Impression:               - Normal esophagus.                           - Z-line regular.                           - A medium amount of food (residue) in the stomach.                           - Gastric ulcer with no stigmata of bleeding.                           - Normal cardia and pylorus.                            - One non-bleeding duodenal ulcer with no stigmata                            of bleeding.                           - Normal upper third of esophagus and middle third                            of esophagus.                           - Acquired duodenal stenosis.                           - No specimens collected. Moderate Sedation:      Moderate (conscious) sedation was administered by the endoscopy nurse       and supervised by the endoscopist. The following parameters were       monitored: oxygen saturation, heart rate, blood pressure, CO2       capnography and response to care. Total physician intraservice time was       12 minutes. Recommendation:           - Return patient to hospital ward for ongoing care.                           - NPO.                           - Continue present medications.                           - NG suction to low intermittent pressure.                           -  H. pylori serology.                           - Repeat EGD with duodenal dilation under MAC in                            two days. Procedure Code(s):        --- Professional ---                           [phone], Esophagogastroduodenoscopy, flexible,                            transoral; diagnostic, including collection of                            specimen(s) by brushing or washing, when performed                            (separate procedure)                           99152, Moderate sedation services provided by the                            same physician or other qualified health care                            professional performing the diagnostic or                            therapeutic service that the sedation supports,                            requiring the presence of an independent trained                            observer to assist in the monitoring of the                            patient's level of consciousness and physiological                             status; initial 15 minutes of intraservice time,                            patient age 74 years or older Diagnosis Code(s):        --- Professional ---                           K25.9, Gastric ulcer, unspecified as acute or                            chronic, without hemorrhage or perforation  K26.9, Duodenal ulcer, unspecified as acute or                            chronic, without hemorrhage or perforation                           K31.5, Obstruction of duodenum                           K92.0, Hematemesis                           R11.2, Nausea with vomiting, unspecified CPT copyright 2016 [employer]. All rights reserved. The codes documented in this report are preliminary and upon coder review may  be revised to meet current compliance requirements. Hildred Laser, MD Hildred Laser, MD 01/21/2017 1:16:59 PM This report has been signed electronically. Number of Addenda: 0

## 2017-01-21 NOTE — ED Notes (Signed)
Pt ambulated to restroom. Pt used call light to advise she had vomited again & was really hurting. No emeses noted in toilet.

## 2017-01-21 NOTE — Progress Notes (Signed)
PROGRESS NOTE    [name]  SWH:[id number] DOB: 04/22/1966 DOA: 01/21/2017 PCP: Celedonio Savage, MD    Brief Narrative: [name] is an 51 y.o. female with hx of anxiety depression, prior DU and gastric ulcer, hx of perforated DU s/p repair, fibromyalgia, presented to the ER with increasing abdominal pain, nausea, vomiting coffee ground followed by old blood.  She had an abdominal CT on April 18, which was negative except for increase stool burden.  She has been having symptoms for at least a month, and has seen GI, Dr Dereck Leep and he planned to have an EGD performed on her.  She denied black or bloody stool, taking NSAIDS or steroids.  She does smoke with denied alcohol use.  Work up in the ER showed Hb of 16 g per dL, and normal BUN.  Her Stool Guaiac is negative.  She has no SOB or CP.  EDP spoke with Dr Dereck Leep tonight, and he would like to proceed with EGD later today.  Hospitalist was asked to admit her for further evaluation.    Assessment & Plan:   Active Problems:   Abdominal pain   History of gastric ulcer   Hypertension   Fibromyalgia   1-GI bleed, vomiting; Large duodenal bulbar ulcer with stricture.  Endoscopy; stomach with moderate amount of debris, small pre pyloric Ulcer. Large bulbar Ulcer with stricture, endoscopy unable to be advanced to second part of duodenum.  Continue with IV protonix.  IV fluids. NPO.  NGT Appreciate Dr Laural Golden help.  Plan to repeat endoscopy in 2 days for dilation.  Cycle hb.   2-HTN; hold Norvasc. Resume when tolerating oral.  PRN hydralazine.   3-Tachycardia;  Per telemetry run of flutter.  Check mg. Check EKG    DVT prophylaxis:  SCD , no anticoagulation due to GI bleed.  Code Status: full code.  Family Communication: care discussed with patient.  Disposition Plan: home at time of discharge    Consultants:   GI, Dr Laural Golden    Procedures: endoscopy    Antimicrobials:none   Subjective: She is sleepy, just came from  procedure.  Pain on and off.  Fentanyl didn't last long   Objective: Vitals:   01/21/17 0840 01/21/17 0845 01/21/17 0850 01/21/17 0855  BP: 106/66 138/77 98/86 113/79  Pulse: (!) 111 (!) 110 (!) 122 (!) 113  Resp: 16 20 20  (!) 23  Temp:      TempSrc:      SpO2: 100% 100% 100% 100%  Weight:      Height:        Intake/Output Summary (Last 24 hours) at 01/21/17 1007 Last data filed at 01/21/17 0859  Gross per 24 hour  Intake             2300 ml  Output                0 ml  Net             2300 ml   Filed Weights   01/21/17 0008 01/21/17 0339  Weight: 48.1 kg (106 lb) 49.9 kg (109 lb 14.4 oz)    Examination:  General exam: Appears calm and comfortable , lethargic  Respiratory system: Clear to auscultation. Respiratory effort normal. Cardiovascular system: S1 & S2 heard, RRR. No JVD, murmurs, rubs, gallops or clicks. No pedal edema. Gastrointestinal system: Abdomen is nondistended, soft and nontender. No organomegaly or masses felt. Normal bowel sounds heard. Central nervous system: No focal neurological deficits  Extremities: Symmetric 5 x 5 power. Skin: No rashes, lesions or ulcers     Data Reviewed: I have personally reviewed following labs and imaging studies  CBC:  Recent Labs Lab 01/21/17 0057 01/21/17 0358  WBC 12.3* 9.9  HGB 16.5* 14.6  HCT 47.6* 43.6  MCV 101.7* 102.8*  PLT 419* 485*   Basic Metabolic Panel:  Recent Labs Lab 01/21/17 0057  NA 138  K 4.0  CL 105  CO2 23  GLUCOSE 99  BUN 17  CREATININE 0.86  CALCIUM 9.5   GFR: Estimated Creatinine Clearance: 62.3 mL/min (by C-G formula based on SCr of 0.86 mg/dL). Liver Function Tests:  Recent Labs Lab 01/21/17 0057  AST 18  ALT 13*  ALKPHOS 68  BILITOT 0.4  PROT 7.6  ALBUMIN 4.2    Recent Labs Lab 01/21/17 0057  LIPASE 23   No results for input(s): AMMONIA in the last 168 hours. Coagulation Profile:  Recent Labs Lab 01/21/17 0057  INR 1.06   Cardiac Enzymes: No  results for input(s): CKTOTAL, CKMB, CKMBINDEX, TROPONINI in the last 168 hours. BNP (last 3 results) No results for input(s): PROBNP in the last 8760 hours. HbA1C: No results for input(s): HGBA1C in the last 72 hours. CBG:  Recent Labs Lab 01/21/17 0807  GLUCAP 123*   Lipid Profile: No results for input(s): CHOL, HDL, LDLCALC, TRIG, CHOLHDL, LDLDIRECT in the last 72 hours. Thyroid Function Tests: No results for input(s): TSH, T4TOTAL, FREET4, T3FREE, THYROIDAB in the last 72 hours. Anemia Panel: No results for input(s): VITAMINB12, FOLATE, FERRITIN, TIBC, IRON, RETICCTPCT in the last 72 hours. Sepsis Labs: No results for input(s): PROCALCITON, LATICACIDVEN in the last 168 hours.  No results found for this or any previous visit (from the past 240 hour(s)).       Radiology Studies: Dg Chest Port 1 View  Result Date: 01/21/2017 CLINICAL DATA:  Abdominal pain tonight.  Minimal coughing. EXAM: PORTABLE CHEST 1 VIEW COMPARISON:  04/09/2016 FINDINGS: A single AP portable view of the chest demonstrates no focal airspace consolidation or alveolar edema. The lungs are grossly clear. There is no large effusion or pneumothorax. Cardiac and mediastinal contours appear unremarkable. IMPRESSION: No active disease. Electronically Signed   By: Andreas Newport M.D.   On: 01/21/2017 03:28        Scheduled Meds: . lidocaine      . meperidine      . midazolam      . [START ON 01/24/2017] pantoprazole  40 mg Intravenous Q12H  . promethazine      . simethicone      . sodium chloride flush  3 mL Intravenous Q12H  . sodium chloride flush       Continuous Infusions: . dextrose 5 % and 0.9% NaCl 125 mL/hr at 01/21/17 0411  . pantoprozole (PROTONIX) infusion 8 mg/hr (01/21/17 0207)     LOS: 0 days    Time spent: 25 minutes.     Elmarie Shiley, MD [facility] Pager [phone]  If 7PM-7AM, please contact night-coverage [email or web address] Password [email or web address] 01/21/2017, 10:07 AM

## 2017-01-21 NOTE — ED Notes (Signed)
Pt seen by her PCP, CT scan done last Thursday & ultra sound done today. Dr Wenda Overland at [facility].

## 2017-01-21 NOTE — Progress Notes (Signed)
Attempted to place an 12 french NGT in the patient.  She stated that she just could not do it.  I confirmed her decision and she stated that she did not want the tube placed.  I notified Dr. Laural Golden of the refusal and he stated to keep her NPO since nothing is absorbing in her stomach. He also states that he will be up on the floor to see the patient this afternoon.  I discussed with patient that I spoke with rehman.  She verbalizes understanding.

## 2017-01-21 NOTE — Progress Notes (Signed)
Brief EGD note;  Stomach with moderate for debris. Some of it suctioned with the scope. Small prepyloric ulcer. Large bulbar ulcer with stricture. Scope not advanced to second part of the duodenum.  NG decompression. Repeat EGD in 2 days.

## 2017-01-21 NOTE — Progress Notes (Signed)
Notified Dr. Tyrell Antonio that according to the Centralized Tele tech that the patient had 4 mins of a. Flutter prior to my shift.  She reports that the nurse stated that she was in the BR.  Will continue to monitor.

## 2017-01-22 ENCOUNTER — Encounter (HOSPITAL_COMMUNITY): Payer: Self-pay | Admitting: Internal Medicine

## 2017-01-22 DIAGNOSIS — Z8719 Personal history of other diseases of the digestive system: Secondary | ICD-10-CM

## 2017-01-22 LAB — CBC
HEMATOCRIT: 41.2 % (ref 36.0–46.0)
HEMOGLOBIN: 13.9 g/dL (ref 12.0–15.0)
MCH: 34.8 pg — AB (ref 26.0–34.0)
MCHC: 33.7 g/dL (ref 30.0–36.0)
MCV: 103 fL — AB (ref 78.0–100.0)
Platelets: 371 10*3/uL (ref 150–400)
RBC: 4 MIL/uL (ref 3.87–5.11)
RDW: 13 % (ref 11.5–15.5)
WBC: 7.3 10*3/uL (ref 4.0–10.5)

## 2017-01-22 LAB — H. PYLORI ANTIBODY, IGG: H Pylori IgG: <0.8 Index Value (ref 0.00–0.79)

## 2017-01-22 LAB — HIV ANTIBODY (ROUTINE TESTING W REFLEX): HIV Screen 4th Generation wRfx: NONREACTIVE

## 2017-01-22 NOTE — Progress Notes (Signed)
Initial Nutrition Assessment  DOCUMENTATION CODES:  Not applicable, Underweight  INTERVENTION:  Follow for diet advancement and monitor subsequent diet tolerance. Add supplements, make diet modification or provide education as clinically warranted.   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   GOAL:  Patient will meet greater than or equal to 90% of their needs  MONITOR:  PO intake, Diet advancement, Labs  REASON FOR ASSESSMENT:  Malnutrition Screening Tool    ASSESSMENT:  51 y/o female PMHx Anxiety, Depression, Prior duodenal and gastric Ulcer, prior perf Duodenal ulcer s/p repair, fibromyalgia. Presents with abdominal pain, nausea, hematemesis, reports symptoms for 1 month.   Pt had EGD performed yesterday. Found cratered gastric ulcer,  cratered duodenal ulcer and acquired duodenal stenosis.   Pt seen today with a couple friends/family member present. She reports that for the last 1-2 weeks, she has been unable to tolerate any type of solid food. She has been subsisting on liquids such as Soda, juice, broth. She was not drinking any type of nutritional beverage. She was taking potassium at home.   She stays her UBW is 112 lbs however, there is no recent documentation to confirm this. Per chart, weight has been 106-109 lbs for the last 6 months. She was admitted yesterday at 106 lbs.   At this time, pt is strict NPO. Patient was questioning if her diet could be advanced. RD called GI MD about potential advancement, but she is to remain strict NPO right now with plans to dilate Duodenal stricture tomorrow and then advance diet to CL.   NFPE: Deferred at this time due to family/friends in room  Labs: BG 100-125 mg/dl Medications: Zofran, iv PPI, IVF w/ D5 providing 17 kcal/hr, prn Pain meds   Recent Labs Lab 01/21/17 0057  01/21/17 0610  NA 138  --   K 4.0  --   CL 105  --   CO2 23  --   BUN 17  --   CREATININE 0.86  --   CALCIUM 9.5  --   MG  --  1.9  GLUCOSE 99  --    Diet Order:  Diet NPO time specified  Skin:  Reviewed, no issues  Last BM:  4/30  Height:  Ht Readings from Last 1 Encounters:  01/21/17 5\' 4"  (1.626 m)   Weight:  Wt Readings from Last 1 Encounters:  01/21/17 109 lb 14.4 oz (49.9 kg)   Wt Readings from Last 10 Encounters:  01/21/17 109 lb 14.4 oz (49.9 kg)  12/26/16 108 lb 8 oz (49.2 kg)  07/29/16 109 lb 4.8 oz (49.6 kg)  06/13/16 106 lb (48.1 kg)  05/31/16 107 lb 11.2 oz (48.9 kg)  05/19/15 111 lb 2 oz (50.4 kg)  03/13/15 120 lb (54.4 kg)  04/15/14 110 lb (49.9 kg)  09/29/13 115 lb (52.2 kg)   Ideal Body Weight:  54.55 kg  BMI:  Body mass index using admit wt is 18.2 kg/m.  Estimated Nutritional Needs:  Kcal:  1500-1700 kcals (31-35 kcal/kg bw) Protein:  53-63g (1.1-1.3 g/kg bw) Fluid:  >1.7 L (35 ml/kg bw)  EDUCATION NEEDS:  No education needs identified at this time  Burtis Junes RD, LDN, [facility] Pager: [phone] 01/22/2017 11:33 AM

## 2017-01-22 NOTE — Progress Notes (Signed)
Pt refuses NG tube insertion today.  Pt states that she "just can't stand it".

## 2017-01-22 NOTE — Progress Notes (Signed)
PROGRESS NOTE    [name]  SAY:[id number] DOB: 1965/10/08 DOA: 01/21/2017 PCP: Celedonio Savage, MD     Brief Narrative:  51 y/o woman admitted on 5/1 due to abdominal pain, nausea and hematemesis. EGD performed 5/1 with findings of retained gastric contents, small prepyloric ulcer and GOO due to large bulbar ulcer with stricture. For repeat EGD with dilatation on 5/3.   Assessment & Plan:   Active Problems:   Abdominal pain   History of gastric ulcer   Hypertension   Fibromyalgia   Abdominal Pain/N/V -Due to findings of GOO due to large bulbar ulcer with stricture. -Continue IV protonix. -For repeat EGD in am for dilatation. -Keep NPO for now.  HTN -Holding norvasc given NPO state.  Upper GI Bleed -Presumed from gastric ulcers. -Continue IV PPI. -No further signs of bleeding while in the hospital. -hb has remained stable at around 13-14 and has not required a blood transfusion as of yet.   DVT prophylaxis: SCDs Code Status: full code Family Communication: mother and sister at bedside updated on plan of care and all questions answered. Disposition Plan: pending repeat EGD and GI recommendations; anticipate 48-72 hours.  Consultants:   GI  Procedures:   EGD 5/1  Antimicrobials:  Anti-infectives    None       Subjective: Has no complaints other than some minor epigastric pain and she is hungry.  Objective: Vitals:   01/21/17 0855 01/21/17 1322 01/21/17 2131 01/22/17 0539  BP: 113/79 109/64 119/63 (!) 106/49  Pulse: (!) 113 85 89 79  Resp: (!) 23 18 18 19   Temp:  99 F (37.2 C) 98.9 F (37.2 C) 98.2 F (36.8 C)  TempSrc:  Oral Oral Oral  SpO2: 100% 100% 100% 96%  Weight:      Height:        Intake/Output Summary (Last 24 hours) at 01/22/17 1403 Last data filed at 01/22/17 0319  Gross per 24 hour  Intake           3212.5 ml  Output                0 ml  Net           3212.5 ml   Filed Weights   01/21/17 0008 01/21/17 0339  Weight: 48.1  kg (106 lb) 49.9 kg (109 lb 14.4 oz)    Examination:  General exam: Alert, awake, oriented x 3, very thin Respiratory system: Clear to auscultation. Respiratory effort normal. Cardiovascular system:RRR. No murmurs, rubs, gallops. Gastrointestinal system: Abdomen is nondistended, soft and nontender. No organomegaly or masses felt. Normal bowel sounds heard. Central nervous system: Alert and oriented. No focal neurological deficits. Extremities: No C/C/E, +pedal pulses Skin: No rashes, lesions or ulcers Psychiatry: Judgement and insight appear normal. Mood & affect appropriate.     Data Reviewed: I have personally reviewed following labs and imaging studies  CBC:  Recent Labs Lab 01/21/17 0057 01/21/17 0358 01/21/17 0951 01/21/17 1809 01/22/17 0522  WBC 12.3* 9.9 8.0  --  7.3  HGB 16.5* 14.6 14.4 14.2 13.9  HCT 47.6* 43.6 43.2 42.4 41.2  MCV 101.7* 102.8* 103.3*  --  103.0*  PLT 419* 413* 329  --  235   Basic Metabolic Panel:  Recent Labs Lab 01/21/17 0057 01/21/17 0610  NA 138  --   K 4.0  --   CL 105  --   CO2 23  --   GLUCOSE 99  --   BUN 17  --  CREATININE 0.86  --   CALCIUM 9.5  --   MG  --  1.9   GFR: Estimated Creatinine Clearance: 62.3 mL/min (by C-G formula based on SCr of 0.86 mg/dL). Liver Function Tests:  Recent Labs Lab 01/21/17 0057  AST 18  ALT 13*  ALKPHOS 68  BILITOT 0.4  PROT 7.6  ALBUMIN 4.2    Recent Labs Lab 01/21/17 0057  LIPASE 23   No results for input(s): AMMONIA in the last 168 hours. Coagulation Profile:  Recent Labs Lab 01/21/17 0057  INR 1.06   Cardiac Enzymes: No results for input(s): CKTOTAL, CKMB, CKMBINDEX, TROPONINI in the last 168 hours. BNP (last 3 results) No results for input(s): PROBNP in the last 8760 hours. HbA1C: No results for input(s): HGBA1C in the last 72 hours. CBG:  Recent Labs Lab 01/21/17 0807  GLUCAP 123*   Lipid Profile: No results for input(s): CHOL, HDL, LDLCALC, TRIG,  CHOLHDL, LDLDIRECT in the last 72 hours. Thyroid Function Tests: No results for input(s): TSH, T4TOTAL, FREET4, T3FREE, THYROIDAB in the last 72 hours. Anemia Panel: No results for input(s): VITAMINB12, FOLATE, FERRITIN, TIBC, IRON, RETICCTPCT in the last 72 hours. Urine analysis:    Component Value Date/Time   COLORURINE AMBER (A) 01/21/2017 0012   APPEARANCEUR HAZY (A) 01/21/2017 0012   LABSPEC 1.030 01/21/2017 0012   PHURINE 5.0 01/21/2017 0012   GLUCOSEU NEGATIVE 01/21/2017 0012   HGBUR SMALL (A) 01/21/2017 0012   BILIRUBINUR NEGATIVE 01/21/2017 0012   KETONESUR 5 (A) 01/21/2017 0012   PROTEINUR 30 (A) 01/21/2017 0012   UROBILINOGEN 0.2 04/15/2014 1238   NITRITE NEGATIVE 01/21/2017 0012   LEUKOCYTESUR NEGATIVE 01/21/2017 0012   Sepsis Labs: @LABRCNTIP (procalcitonin:4,lacticidven:4)  )No results found for this or any previous visit (from the past 240 hour(s)).       Radiology Studies: Dg Chest Port 1 View  Result Date: 01/21/2017 CLINICAL DATA:  Abdominal pain tonight.  Minimal coughing. EXAM: PORTABLE CHEST 1 VIEW COMPARISON:  04/09/2016 FINDINGS: A single AP portable view of the chest demonstrates no focal airspace consolidation or alveolar edema. The lungs are grossly clear. There is no large effusion or pneumothorax. Cardiac and mediastinal contours appear unremarkable. IMPRESSION: No active disease. Electronically Signed   By: Andreas Newport M.D.   On: 01/21/2017 03:28        Scheduled Meds: . ondansetron (ZOFRAN) IV  4 mg Intravenous Once  . [START ON 01/24/2017] pantoprazole  40 mg Intravenous Q12H  . sodium chloride flush  3 mL Intravenous Q12H   Continuous Infusions: . dextrose 5 % and 0.9% NaCl 100 mL/hr at 01/22/17 1121  . pantoprozole (PROTONIX) infusion 8 mg/hr (01/22/17 0319)     LOS: 1 day    Time spent: 25 minutes. Greater than 50% of this time was spent in direct contact with the patient coordinating care.     Lelon Frohlich,  MD [facility] Pager [phone]  If 7PM-7AM, please contact night-coverage [email or web address] Password [email or web address] 01/22/2017, 2:03 PM

## 2017-01-22 NOTE — Progress Notes (Signed)
  Subjective:  Patient feels much better. She has mild nausea but no vomiting. She says upper abdominal pain has improved a great deal. She complains of lower back pain. She has history of back pain. She had a bowel movement. Stool was not black.  Objective: Blood pressure 109/63, pulse 92, temperature 98.6 F (37 C), temperature source Oral, resp. rate 18, height 5\' 4"  (1.626 m), weight 109 lb 14.4 oz (49.9 kg), SpO2 100 %. Patient is alert and in no acute distress. Abdomen is flat and soft with mild midepigastric tenderness. No organomegaly or masses.  No LE edema or clubbing noted.  Labs/studies Results:   Recent Labs  01/21/17 0358 01/21/17 0951 01/21/17 1809 01/22/17 0522  WBC 9.9 8.0  --  7.3  HGB 14.6 14.4 14.2 13.9  HCT 43.6 43.2 42.4 41.2  PLT 413* 329  --  371    BMET   Recent Labs  01/21/17 0057  NA 138  K 4.0  CL 105  CO2 23  GLUCOSE 99  BUN 17  CREATININE 0.86  CALCIUM 9.5    LFT   Recent Labs  01/21/17 0057  PROT 7.6  ALBUMIN 4.2  AST 18  ALT 13*  ALKPHOS 68  BILITOT 0.4    PT/INR   Recent Labs  01/21/17 0057  LABPROT 13.8  INR 1.06    H. pylori serology is negative.  Assessment:  #1. Gastric outlet obstruction secondary to large duodenal ulcer. Patient could not tolerate NG tube she is doing much better with IV PPI and nothing by mouth status. H. pylori serology is negative. Therefore peptic ulcer disease secondary to chronic NSAID use.  Recommendations:  EGD with duodenal stricture dilation under monitored anesthesia care on 01/23/2017.

## 2017-01-23 ENCOUNTER — Inpatient Hospital Stay (HOSPITAL_COMMUNITY): Payer: Medicaid Other | Admitting: Anesthesiology

## 2017-01-23 ENCOUNTER — Encounter (HOSPITAL_COMMUNITY)
Admission: EM | Disposition: A | Discharge status: Home / Self Care | Payer: Self-pay | Source: Home / Self Care | Attending: Internal Medicine

## 2017-01-23 ENCOUNTER — Encounter (HOSPITAL_COMMUNITY): Payer: Self-pay | Admitting: *Deleted

## 2017-01-23 DIAGNOSIS — K920 Hematemesis: Secondary | ICD-10-CM

## 2017-01-23 DIAGNOSIS — R112 Nausea with vomiting, unspecified: Secondary | ICD-10-CM

## 2017-01-23 DIAGNOSIS — K269 Duodenal ulcer, unspecified as acute or chronic, without hemorrhage or perforation: Secondary | ICD-10-CM

## 2017-01-23 DIAGNOSIS — K279 Peptic ulcer, site unspecified, unspecified as acute or chronic, without hemorrhage or perforation: Secondary | ICD-10-CM

## 2017-01-23 DIAGNOSIS — K3189 Other diseases of stomach and duodenum: Secondary | ICD-10-CM

## 2017-01-23 DIAGNOSIS — K315 Obstruction of duodenum: Secondary | ICD-10-CM

## 2017-01-23 DIAGNOSIS — K259 Gastric ulcer, unspecified as acute or chronic, without hemorrhage or perforation: Secondary | ICD-10-CM

## 2017-01-23 HISTORY — PX: ESOPHAGOGASTRODUODENOSCOPY (EGD) WITH PROPOFOL: SHX5813

## 2017-01-23 LAB — POCT I-STAT 4, (NA,K, GLUC, HGB,HCT)
Glucose, Bld: 89 mg/dL (ref 65–99)
HEMATOCRIT: 42 % (ref 36.0–46.0)
HEMOGLOBIN: 14.3 g/dL (ref 12.0–15.0)
POTASSIUM: 2.9 mmol/L — AB (ref 3.5–5.1)
SODIUM: 144 mmol/L (ref 135–145)

## 2017-01-23 LAB — BASIC METABOLIC PANEL
ANION GAP: 6 (ref 5–15)
BUN: <5 mg/dL — ABNORMAL LOW (ref 6–20)
CALCIUM: 8.2 mg/dL — AB (ref 8.9–10.3)
CO2: 25 mmol/L (ref 22–32)
Chloride: 108 mmol/L (ref 101–111)
Creatinine, Ser: 0.49 mg/dL (ref 0.44–1.00)
Glucose, Bld: 105 mg/dL — ABNORMAL HIGH (ref 65–99)
Potassium: 2.7 mmol/L — CL (ref 3.5–5.1)
Sodium: 139 mmol/L (ref 135–145)

## 2017-01-23 LAB — CBC
HCT: 39 % (ref 36.0–46.0)
HEMOGLOBIN: 13.2 g/dL (ref 12.0–15.0)
MCH: 34.8 pg — ABNORMAL HIGH (ref 26.0–34.0)
MCHC: 33.8 g/dL (ref 30.0–36.0)
MCV: 102.9 fL — ABNORMAL HIGH (ref 78.0–100.0)
Platelets: 325 10*3/uL (ref 150–400)
RBC: 3.79 MIL/uL — AB (ref 3.87–5.11)
RDW: 12.8 % (ref 11.5–15.5)
WBC: 7 10*3/uL (ref 4.0–10.5)

## 2017-01-23 SURGERY — ESOPHAGOGASTRODUODENOSCOPY (EGD) WITH PROPOFOL
Anesthesia: Monitor Anesthesia Care

## 2017-01-23 MED ORDER — LIDOCAINE VISCOUS 2 % MT SOLN
3.0000 mL | OROMUCOSAL | Status: AC | PRN
  Administered 2017-01-23 (×2): 3 mL via OROMUCOSAL

## 2017-01-23 MED ORDER — SODIUM CHLORIDE 0.9 % IV SOLN: INTRAVENOUS | Status: DC

## 2017-01-23 MED ORDER — MIDAZOLAM HCL 2 MG/2ML IJ SOLN
INTRAMUSCULAR | Status: AC
  Filled 2017-01-23: qty 2

## 2017-01-23 MED ORDER — FENTANYL CITRATE (PF) 100 MCG/2ML IJ SOLN
25.0000 ug | Freq: Once | INTRAMUSCULAR | Status: AC
  Administered 2017-01-23: 25 ug via INTRAVENOUS

## 2017-01-23 MED ORDER — CYCLOBENZAPRINE HCL 10 MG PO TABS
10.0000 mg | ORAL_TABLET | Freq: Once | ORAL | Status: AC
  Administered 2017-01-24: 10 mg via ORAL
  Filled 2017-01-23: qty 1

## 2017-01-23 MED ORDER — LACTATED RINGERS IV SOLN
INTRAVENOUS | Status: DC
  Administered 2017-01-23: 1000 mL via INTRAVENOUS
  Administered 2017-01-23: 09:00:00 via INTRAVENOUS

## 2017-01-23 MED ORDER — HYDROCODONE-ACETAMINOPHEN 5-325 MG PO TABS: 2.0000 | ORAL_TABLET | Freq: Once | ORAL | Status: DC

## 2017-01-23 MED ORDER — MIDAZOLAM HCL 2 MG/2ML IJ SOLN
1.0000 mg | INTRAMUSCULAR | Status: AC
  Administered 2017-01-23: 2 mg via INTRAVENOUS

## 2017-01-23 MED ORDER — LIDOCAINE VISCOUS 2 % MT SOLN
OROMUCOSAL | Status: AC
  Filled 2017-01-23: qty 15

## 2017-01-23 MED ORDER — PROPOFOL 500 MG/50ML IV EMUL
INTRAVENOUS | Status: DC | PRN
  Administered 2017-01-23: 09:00:00 via INTRAVENOUS
  Administered 2017-01-23: 150 ug/kg/min via INTRAVENOUS

## 2017-01-23 MED ORDER — LIDOCAINE HCL (PF) 1 % IJ SOLN
INTRAMUSCULAR | Status: AC
  Filled 2017-01-23: qty 5

## 2017-01-23 MED ORDER — PROPOFOL 10 MG/ML IV BOLUS
INTRAVENOUS | Status: AC
  Filled 2017-01-23: qty 20

## 2017-01-23 MED ORDER — PROPOFOL 10 MG/ML IV BOLUS
INTRAVENOUS | Status: DC | PRN
  Administered 2017-01-23 (×5): 10 mg via INTRAVENOUS

## 2017-01-23 MED ORDER — POTASSIUM CHLORIDE 10 MEQ/100ML IV SOLN
10.0000 meq | INTRAVENOUS | Status: AC
  Administered 2017-01-23 (×3): 10 meq via INTRAVENOUS
  Filled 2017-01-23 (×3): qty 100

## 2017-01-23 MED ORDER — HYDROCODONE-ACETAMINOPHEN 5-325 MG PO TABS
1.0000 | ORAL_TABLET | Freq: Four times a day (QID) | ORAL | Status: DC | PRN
  Administered 2017-01-24: 2 via ORAL
  Filled 2017-01-23: qty 2

## 2017-01-23 MED ORDER — POTASSIUM CHLORIDE 10 MEQ/100ML IV SOLN: 10.0000 meq | INTRAVENOUS | Status: DC

## 2017-01-23 MED ORDER — FENTANYL CITRATE (PF) 100 MCG/2ML IJ SOLN
INTRAMUSCULAR | Status: AC
  Filled 2017-01-23: qty 2

## 2017-01-23 NOTE — Anesthesia Preprocedure Evaluation (Signed)
Anesthesia Evaluation  Patient identified by MRN, date of birth, ID band Patient awake    Reviewed: Allergy & Precautions, NPO status , Patient's Chart, lab work & pertinent test results  Airway Mallampati: II  TM Distance: >3 FB Neck ROM: Full    Dental  (+) Teeth Intact, Partial Lower, Partial Upper   Pulmonary Current Smoker,    Pulmonary exam normal breath sounds clear to auscultation       Cardiovascular hypertension, Normal cardiovascular exam Rhythm:Regular Rate:Normal     Neuro/Psych PSYCHIATRIC DISORDERS Anxiety Depression  Neuromuscular disease    GI/Hepatic Neg liver ROS, GERD  ,  Endo/Other  negative endocrine ROS  Renal/GU negative Renal ROS  negative genitourinary   Musculoskeletal  (+) Fibromyalgia -  Abdominal   Peds negative pediatric ROS (+)  Hematology negative hematology ROS (+)   Anesthesia Other Findings   Reproductive/Obstetrics negative OB ROS                            Anesthesia Physical Anesthesia Plan  ASA: II  Anesthesia Plan: MAC   Post-op Pain Management:    Induction: Intravenous  Airway Management Planned: Simple Face Mask  Additional Equipment:   Intra-op Plan:   Post-operative Plan:   Informed Consent: I have reviewed the patients History and Physical, chart, labs and discussed the procedure including the risks, benefits and alternatives for the proposed anesthesia with the patient or authorized representative who has indicated his/her understanding and acceptance.     Plan Discussed with:   Anesthesia Plan Comments:         Anesthesia Quick Evaluation

## 2017-01-23 NOTE — Op Note (Signed)
[facility] Patient Name: [name] Procedure Date: 01/23/2017 8:22 AM MRN: [id number] Date of Birth: 1966/04/25 Attending MD: Hildred Laser , MD CSN: [id number] Age: 51 Admit Type: Inpatient Procedure:                Upper GI endoscopy Indications:              For therapy of duodenal stenosis Providers:                Hildred Laser, MD, Janeece Riggers, RN, Aram Candela Referring MD:             Thersa Salt, MD Medicines:                Cetacaine spray, Propofol per Anesthesia Complications:            No immediate complications. Estimated Blood Loss:     Estimated blood loss was minimal. Procedure:                Pre-Anesthesia Assessment:                           - Prior to the procedure, a History and Physical                            was performed, and patient medications and                            allergies were reviewed. The patient's tolerance of                            previous anesthesia was also reviewed. The risks                            and benefits of the procedure and the sedation                            options and risks were discussed with the patient.                            All questions were answered, and informed consent                            was obtained. Prior Anticoagulants: The patient                            last took previous NSAID medication 8 days prior to                            the procedure. ASA Grade Assessment: II - A patient                            with mild systemic disease. After reviewing the                            risks and benefits, the patient was deemed in  satisfactory condition to undergo the procedure.                           After obtaining informed consent, the endoscope was                            passed under direct vision. Throughout the                            procedure, the patient's blood pressure, pulse, and                            oxygen  saturations were monitored continuously. The                            [id number] ([id number]) scope was introduced through the                            mouth, and advanced to the second part of duodenum.                            The upper GI endoscopy was accomplished without                            difficulty. The patient tolerated the procedure                            well. Scope In: 9:05:56 AM Scope Out: 9:19:58 AM Total Procedure Duration: 0 hours 14 minutes 2 seconds  Findings:      The examined esophagus was normal.      The Z-line was irregular and was found 36 cm from the incisors.      A 3 cm hiatal hernia was present.      One non-bleeding cratered gastric ulcer with no stigmata of bleeding was       found in the prepyloric region of the stomach. The lesion was four mm by       six mm in largest dimension.      The exam of the stomach was otherwise normal.      Food (residue) was found in the duodenal bulb. Removal of food was       accomplished. Removal of food was accomplished.      One non-bleeding cratered duodenal ulcer with no stigmata of bleeding       was found in the duodenal bulb. The lesion was arge circumferential       ulcer mm in largest dimension.      An acquired benign-appearing, intrinsic severe stenosis was found in the       duodenal bulb and was traversed after dilation. A TTS dilator was passed       through the scope. Dilation with a 12-13.5-15 mm, a 12 mm and a 13.5 mm       pyloric balloon dilator was performed. The dilation site was examined       and showed moderate improvement in luminal narrowing.      The second portion of the duodenum was normal.      A non-bleeding diverticulum was  found in the first portion of the       duodenum. Impression:               - Normal esophagus.                           - Z-line irregular, 36 cm from the incisors.                           - 3 cm hiatal hernia.                           - Non-bleeding  gastric ulcer with no stigmata of                            bleeding.                           - Retained food in the duodenum. It was removed and                            dropped into the stomach.                           - One non-bleeding duodenal ulcer with no stigmata                            of bleeding.                           - Acquired duodenal stenosis. Dilated.                           - Normal second portion of the duodenum.                           - Non-bleeding duodenal diverticulum. Moderate Sedation:      Per Anesthesia Care Recommendation:           - Return patient to hospital ward for ongoing care.                           - Full liquid diet today.                           - Continue present medications.                           - Repeat upper endoscopy in 4 weeks. Procedure Code(s):        --- Professional ---                           [phone], Esophagogastroduodenoscopy, flexible,                            transoral; with removal of foreign body(s)  [id number], Esophagogastroduodenoscopy, flexible,                            transoral; with dilation of gastric/duodenal                            stricture(s) (eg, balloon, bougie) Diagnosis Code(s):        --- Professional ---                           K22.8, Other specified diseases of esophagus                           K44.9, Diaphragmatic hernia without obstruction or                            gangrene                           K25.9, Gastric ulcer, unspecified as acute or                            chronic, without hemorrhage or perforation                           T18.3XXA, Foreign body in small intestine, initial                            encounter                           K26.9, Duodenal ulcer, unspecified as acute or                            chronic, without hemorrhage or perforation                           K31.5, Obstruction of duodenum                            K57.10, Diverticulosis of small intestine without                            perforation or abscess without bleeding CPT copyright 2016 [employer]. All rights reserved. The codes documented in this report are preliminary and upon coder review may  be revised to meet current compliance requirements. Hildred Laser, MD Hildred Laser, MD 01/23/2017 9:35:58 AM This report has been signed electronically. Number of Addenda: 0

## 2017-01-23 NOTE — Progress Notes (Signed)
Patient feels fine. She denies abdominal pain or nausea. She ate all of her lunch and evening meal(full liquid meal). She is hungry. Will advance diet starting with breakfast tomorrow. If she does well she should be able to go home tomorrow and which time she can be converted to oral PPI at twice a day schedule.

## 2017-01-23 NOTE — Transfer of Care (Signed)
Immediate Anesthesia Transfer of Care Note  Patient: [name]  Procedure(s) Performed: Procedure(s) with comments: ESOPHAGOGASTRODUODENOSCOPY (EGD) WITH PROPOFOL (N/A) - duodenal striture dilation  Patient Location: PACU  Anesthesia Type:MAC  Level of Consciousness: awake  Airway & Oxygen Therapy: Patient Spontanous Breathing  Post-op Assessment: Report given to RN  Post vital signs: Reviewed and stable  Last Vitals:  Vitals:   01/23/17 0845 01/23/17 0925  BP: 114/75 (!) 87/57  Pulse:    Resp: (!) 24 15  Temp:  37.1 C    Last Pain:  Vitals:   01/23/17 0903  TempSrc:   PainSc: 4       Patients Stated Pain Goal: 7 (01/75/10 2585)  Complications: No apparent anesthesia complications

## 2017-01-23 NOTE — Progress Notes (Signed)
PROGRESS NOTE    [name]  ACZ:[id number] DOB: 11-15-1965 DOA: 01/21/2017 PCP: Celedonio Savage, MD     Brief Narrative:  51 y/o woman admitted on 5/1 due to abdominal pain, nausea and hematemesis. EGD performed 5/1 with findings of retained gastric contents, small prepyloric ulcer and GOO due to large bulbar ulcer with stricture. For repeat EGD with dilatation on 5/3.   Assessment & Plan:   Active Problems:   Abdominal pain   History of gastric ulcer   Hypertension   Fibromyalgia   Abdominal Pain/N/V -Due to findings of GOO due to large bulbar ulcer with stricture. -Continue IV protonix. -Had repeat EGD with diltation on 5/3. -Advance diet to full liquids  HTN -Holding norvasc given NPO state.  Upper GI Bleed -Presumed from gastric ulcers. -Continue IV PPI. -No further signs of bleeding while in the hospital. -hb has remained stable at around 13-14 and has not required a blood transfusion as of yet.   DVT prophylaxis: SCDs Code Status: full code Family Communication: mother at bedside updated on plan of care and all questions answered. Disposition Plan: anticipate DC home in 24-48 hours  Consultants:   GI  Procedures:   EGD 5/1  Antimicrobials:  Anti-infectives    None       Subjective: Has no complaints today.  Objective: Vitals:   01/23/17 0930 01/23/17 0945 01/23/17 0953 01/23/17 1024  BP: 111/67 109/68    Pulse: 81 71 76   Resp: 20 17 16    Temp:      TempSrc:      SpO2: 100% 97% 99% 98%  Weight:      Height:        Intake/Output Summary (Last 24 hours) at 01/23/17 1336 Last data filed at 01/23/17 0928  Gross per 24 hour  Intake          1067.08 ml  Output                0 ml  Net          1067.08 ml   Filed Weights   01/21/17 0008 01/21/17 0339  Weight: 48.1 kg (106 lb) 49.9 kg (109 lb 14.4 oz)    Examination:  General exam: Alert, awake, oriented x 3 Respiratory system: Clear to auscultation. Respiratory effort  normal. Cardiovascular system:RRR. No murmurs, rubs, gallops. Gastrointestinal system: Abdomen is nondistended, soft and nontender. No organomegaly or masses felt. Normal bowel sounds heard. Central nervous system: Alert and oriented. No focal neurological deficits. Extremities: No C/C/E, +pedal pulses Skin: No rashes, lesions or ulcers Psychiatry: Judgement and insight appear normal. Mood & affect appropriate.     Data Reviewed: I have personally reviewed following labs and imaging studies  CBC:  Recent Labs Lab 01/21/17 0057 01/21/17 0358 01/21/17 0951 01/21/17 1809 01/22/17 0522 01/23/17 0521 01/23/17 0816  WBC 12.3* 9.9 8.0  --  7.3 7.0  --   HGB 16.5* 14.6 14.4 14.2 13.9 13.2 14.3  HCT 47.6* 43.6 43.2 42.4 41.2 39.0 42.0  MCV 101.7* 102.8* 103.3*  --  103.0* 102.9*  --   PLT 419* 413* 329  --  371 325  --    Basic Metabolic Panel:  Recent Labs Lab 01/21/17 0057 01/21/17 0610 01/23/17 0521 01/23/17 0816  NA 138  --  139 144  K 4.0  --  2.7* 2.9*  CL 105  --  108  --   CO2 23  --  25  --   GLUCOSE 99  --  105* 89  BUN 17  --  <5*  --   CREATININE 0.86  --  0.49  --   CALCIUM 9.5  --  8.2*  --   MG  --  1.9  --   --    GFR: Estimated Creatinine Clearance: 67 mL/min (by C-G formula based on SCr of 0.49 mg/dL). Liver Function Tests:  Recent Labs Lab 01/21/17 0057  AST 18  ALT 13*  ALKPHOS 68  BILITOT 0.4  PROT 7.6  ALBUMIN 4.2    Recent Labs Lab 01/21/17 0057  LIPASE 23   No results for input(s): AMMONIA in the last 168 hours. Coagulation Profile:  Recent Labs Lab 01/21/17 0057  INR 1.06   Cardiac Enzymes: No results for input(s): CKTOTAL, CKMB, CKMBINDEX, TROPONINI in the last 168 hours. BNP (last 3 results) No results for input(s): PROBNP in the last 8760 hours. HbA1C: No results for input(s): HGBA1C in the last 72 hours. CBG:  Recent Labs Lab 01/21/17 0807  GLUCAP 123*   Lipid Profile: No results for input(s): CHOL, HDL,  LDLCALC, TRIG, CHOLHDL, LDLDIRECT in the last 72 hours. Thyroid Function Tests: No results for input(s): TSH, T4TOTAL, FREET4, T3FREE, THYROIDAB in the last 72 hours. Anemia Panel: No results for input(s): VITAMINB12, FOLATE, FERRITIN, TIBC, IRON, RETICCTPCT in the last 72 hours. Urine analysis:    Component Value Date/Time   COLORURINE AMBER (A) 01/21/2017 0012   APPEARANCEUR HAZY (A) 01/21/2017 0012   LABSPEC 1.030 01/21/2017 0012   PHURINE 5.0 01/21/2017 0012   GLUCOSEU NEGATIVE 01/21/2017 0012   HGBUR SMALL (A) 01/21/2017 0012   BILIRUBINUR NEGATIVE 01/21/2017 0012   KETONESUR 5 (A) 01/21/2017 0012   PROTEINUR 30 (A) 01/21/2017 0012   UROBILINOGEN 0.2 04/15/2014 1238   NITRITE NEGATIVE 01/21/2017 0012   LEUKOCYTESUR NEGATIVE 01/21/2017 0012   Sepsis Labs: @LABRCNTIP (procalcitonin:4,lacticidven:4)  )No results found for this or any previous visit (from the past 240 hour(s)).       Radiology Studies: No results found.      Scheduled Meds: . ondansetron (ZOFRAN) IV  4 mg Intravenous Once  . [START ON 01/24/2017] pantoprazole  40 mg Intravenous Q12H  . sodium chloride flush  3 mL Intravenous Q12H   Continuous Infusions: . dextrose 5 % and 0.9% NaCl 100 mL/hr at 01/23/17 1012  . pantoprozole (PROTONIX) infusion 8 mg/hr (01/23/17 1217)     LOS: 2 days    Time spent: 25 minutes. Greater than 50% of this time was spent in direct contact with the patient coordinating care.     Lelon Frohlich, MD [facility] Pager [phone]  If 7PM-7AM, please contact night-coverage [email or web address] Password [email or web address] 01/23/2017, 1:36 PM

## 2017-01-23 NOTE — Progress Notes (Signed)
Brief EGD note:  Small sliding hiatal hernia. 4 x 6 mm prepyloric ulcer with clean base. Large circumferential bulbar ulcer with stenosis at the distal end. Small duodenal diverticulum. Duodenal stricture dilated to 12 and 13.5 mm and scope passed distally.  Begin full liquids.

## 2017-01-23 NOTE — Anesthesia Postprocedure Evaluation (Signed)
Anesthesia Post Note  Patient: [name]  Procedure(s) Performed: Procedure(s) (LRB): ESOPHAGOGASTRODUODENOSCOPY (EGD) WITH PROPOFOL (N/A)  Patient location during evaluation: PACU Anesthesia Type: MAC Level of consciousness: awake and alert and oriented Pain management: pain level controlled Vital Signs Assessment: post-procedure vital signs reviewed and stable Respiratory status: spontaneous breathing Cardiovascular status: blood pressure returned to baseline Postop Assessment: no signs of nausea or vomiting Anesthetic complications: no     Last Vitals:  Vitals:   01/23/17 0945 01/23/17 0953  BP: 109/68   Pulse: 71 76  Resp: 17 16  Temp:      Last Pain:  Vitals:   01/23/17 0903  TempSrc:   PainSc: 4                  Wednesday Ericsson

## 2017-01-24 MED ORDER — PANTOPRAZOLE SODIUM 40 MG PO TBEC: 40.0000 mg | DELAYED_RELEASE_TABLET | Freq: Two times a day (BID) | ORAL | 2 refills | Status: DC

## 2017-01-24 NOTE — Care Management Note (Signed)
Case Management Note  Patient Details  Name: [name] MRN: [id number] Date of Birth: 1966/03/03  Subjective/Objective:                  Pt admitted with abd pain. Chart reviewed for CM needs. Pt is from home with family, ind with ADL's. She has PCP, transportation to appointments and insurance with drug coverage.   Action/Plan: Pt discharging home today with self care.   Expected Discharge Date:  01/24/17               Expected Discharge Plan:  Home/Self Care  In-House Referral:  NA  Discharge planning Services  CM Consult  Post Acute Care Choice:  NA Choice offered to:  NA  Status of Service:  Completed, signed off  Sherald Barge, RN 01/24/2017, 7:53 AM

## 2017-01-24 NOTE — Progress Notes (Signed)
Patient is to be discharged home and in stable condition. Patient given discharged instructions and prescription and verbalizes understanding. Patient escorted out by staff.   Celestia Khat, RN

## 2017-01-24 NOTE — Discharge Summary (Signed)
Physician Discharge Summary  [name]:[id number] DOB: 01/26/1966 DOA: 01/21/2017  PCP: Celedonio Savage, MD  Admit date: 01/21/2017 Discharge date: 01/24/2017  Time spent: 45 minutes  Recommendations for Outpatient Follow-up:  -Will be discharged home today. -Advised to follow up with Dr. Laural Golden in 2 weeks.  Discharge Diagnoses:  Active Problems:   Abdominal pain   History of gastric ulcer   Hypertension   Fibromyalgia   Discharge Condition: Stable and improved  Filed Weights   01/21/17 0008 01/21/17 0339  Weight: 48.1 kg (106 lb) 49.9 kg (109 lb 14.4 oz)    History of present illness:  As per Dr. Marin Comment on 5/1: [name] is an 51 y.o. female with hx of anxiety depression, prior DU and gastric ulcer, hx of perforated DU s/p repair, fibromyalgia, presented to the ER with increasing abdominal pain, nausea, vomiting coffee ground followed by old blood.  She had an abdominal CT on April 18, which was negative except for increase stool burden.  She has been having symptoms for at least a month, and has seen GI, Dr Dereck Leep and he planned to have an EGD performed on her.  She denied black or bloody stool, taking NSAIDS or steroids.  She does smoke with denied alcohol use.  Work up in the ER showed Hb of 16 g per dL, and normal BUN.  Her Stool Guaiac is negative.  She has no SOB or CP.  EDP spoke with Dr Dereck Leep tonight, and he would like to proceed with EGD later today.  Hospitalist was asked to admit her for further evaluation.   Hospital Course:   Abdominal Pain/N/V -Due to findings of GOO due to large bulbar ulcer with stricture. -BID PPI -Had repeat EGD with diltation on 5/3. -Tolerating diet without issues.  HTN -well controlled while hospitalized  Upper GI Bleed -Presumed from gastric ulcers. -Continue BID PPI -No further signs of bleeding while in the hospital. -hb has remained stable at around 13-14 and did not required a blood transfusion.   Procedures:  EGD  x 2 as above   Consultations:  GI  Discharge Instructions  Discharge Instructions    Increase activity slowly    Complete by:  As directed      Allergies as of 01/24/2017      Reactions   Hydrocodone Other (See Comments)   Nausea, feels hot and insomia   Sulfa Antibiotics Hives      Medication List    TAKE these medications   amLODipine 10 MG tablet Commonly known as:  NORVASC Take 10 mg by mouth every morning.   CENTRUM ADULTS Tabs Take by mouth.   LYSINE PO Take by mouth.   pantoprazole 40 MG tablet Commonly known as:  PROTONIX Take 1 tablet (40 mg total) by mouth 2 (two) times daily. What changed:  when to take this   potassium chloride 10 MEQ tablet Commonly known as:  K-DUR,KLOR-CON Take 10 mEq by mouth every morning.   traMADol 50 MG tablet Commonly known as:  ULTRAM Take 50 mg by mouth every 6 (six) hours as needed for moderate pain.   valACYclovir 1000 MG tablet Commonly known as:  VALTREX Take 1,000 mg by mouth daily.      Allergies  Allergen Reactions  . Hydrocodone Other (See Comments)    Nausea, feels hot and insomia  . Sulfa Antibiotics Hives   Follow-up Information    Celedonio Savage, MD. Schedule an appointment as soon as possible for  a visit in 2 week(s).   Specialty:  Family Medicine Contact information: [place] [address] [phone]        Hildred Laser, MD. Schedule an appointment as soon as possible for a visit in 2 week(s).   Specialty:  Gastroenterology Contact information: [address], [address] [place] [address] [phone]            The results of significant diagnostics from this hospitalization (including imaging, microbiology, ancillary and laboratory) are listed below for reference.    Significant Diagnostic Studies: Dg Chest Port 1 View  Result Date: 01/21/2017 CLINICAL DATA:  Abdominal pain tonight.  Minimal coughing. EXAM: PORTABLE CHEST 1 VIEW COMPARISON:  04/09/2016 FINDINGS: A  single [facility] portable view of the chest demonstrates no focal airspace consolidation or alveolar edema. The lungs are grossly clear. There is no large effusion or pneumothorax. Cardiac and mediastinal contours appear unremarkable. IMPRESSION: No active disease. Electronically Signed   By: Andreas Newport M.D.   On: 01/21/2017 03:28    Microbiology: No results found for this or any previous visit (from the past 240 hour(s)).   Labs: Basic Metabolic Panel:  Recent Labs Lab 01/21/17 0057 01/21/17 0610 01/23/17 0521 01/23/17 0816  NA 138  --  139 144  K 4.0  --  2.7* 2.9*  CL 105  --  108  --   CO2 23  --  25  --   GLUCOSE 99  --  105* 89  BUN 17  --  <5*  --   CREATININE 0.86  --  0.49  --   CALCIUM 9.5  --  8.2*  --   MG  --  1.9  --   --    Liver Function Tests:  Recent Labs Lab 01/21/17 0057  AST 18  ALT 13*  ALKPHOS 68  BILITOT 0.4  PROT 7.6  ALBUMIN 4.2    Recent Labs Lab 01/21/17 0057  LIPASE 23   No results for input(s): AMMONIA in the last 168 hours. CBC:  Recent Labs Lab 01/21/17 0057 01/21/17 0358 01/21/17 0951 01/21/17 1809 01/22/17 0522 01/23/17 0521 01/23/17 0816  WBC 12.3* 9.9 8.0  --  7.3 7.0  --   HGB 16.5* 14.6 14.4 14.2 13.9 13.2 14.3  HCT 47.6* 43.6 43.2 42.4 41.2 39.0 42.0  MCV 101.7* 102.8* 103.3*  --  103.0* 102.9*  --   PLT 419* 413* 329  --  371 325  --    Cardiac Enzymes: No results for input(s): CKTOTAL, CKMB, CKMBINDEX, TROPONINI in the last 168 hours. BNP: BNP (last 3 results) No results for input(s): BNP in the last 8760 hours.  ProBNP (last 3 results) No results for input(s): PROBNP in the last 8760 hours.  CBG:  Recent Labs Lab 01/21/17 0807  GLUCAP 123*       Signed:  Lelon Frohlich  [facility] Pager: [phone] 01/24/2017, 7:07 PM

## 2017-01-24 NOTE — Progress Notes (Signed)
Patient ID: [name], female   DOB: [birth year], 51 y.o.   MRN: [id number] Tolerated supper last night. No vomiting. She has no GI complaints. C/o spasms to her lower back. Will need OV in  2-4 weeks. Protonix 40mg  BID

## 2017-01-27 ENCOUNTER — Encounter (INDEPENDENT_AMBULATORY_CARE_PROVIDER_SITE_OTHER): Payer: Self-pay | Admitting: Internal Medicine

## 2017-01-27 ENCOUNTER — Encounter (HOSPITAL_COMMUNITY): Payer: Self-pay | Admitting: Internal Medicine

## 2017-01-28 ENCOUNTER — Ambulatory Visit (INDEPENDENT_AMBULATORY_CARE_PROVIDER_SITE_OTHER): Payer: Medicaid Other | Admitting: Internal Medicine

## 2017-01-28 ENCOUNTER — Encounter (INDEPENDENT_AMBULATORY_CARE_PROVIDER_SITE_OTHER): Payer: Self-pay | Admitting: Internal Medicine

## 2017-01-28 VITALS — BP 100/60 | HR 64 | Temp 98.6°F | Ht 64.0 in | Wt 108.6 lb

## 2017-01-28 DIAGNOSIS — K5909 Other constipation: Secondary | ICD-10-CM | POA: Diagnosis not present

## 2017-01-28 DIAGNOSIS — K279 Peptic ulcer, site unspecified, unspecified as acute or chronic, without hemorrhage or perforation: Secondary | ICD-10-CM | POA: Diagnosis not present

## 2017-01-28 NOTE — Patient Instructions (Signed)
Continue the Miralax daily for constipation PUD: Continue the Protonix BID

## 2017-01-28 NOTE — Progress Notes (Signed)
Subjective:    Patient ID: [name], female    DOB: April 26, 1966, 51 y.o.   MRN: [id number]  HPI Recently admitted to [facility] in May with c/o upper abdominal pain and coffee ground emesis.  Hx of PUD dating back to to 1990 when she had surgery for perforated PUD.  She says she had back pain. She thinks it was from being constipated. She took some Miralax and her back pain has eased. She says she had a large BM.  She feels 100% better today. Appetite is good. She is eating soft foods. She is not trying to push it.    She underwent an EGD 01/21/2017 which revealed Impression:               - Normal esophagus.                           - Z-line regular.                           - A medium amount of food (residue) in the stomach.                           - Gastric ulcer with no stigmata of bleeding.                           - Normal cardia and pylorus.                           - One non-bleeding duodenal ulcer with no stigmata                            of bleeding.                           - Normal upper third of esophagus and middle third                            of esophagus.                           - Acquired duodenal stenosis.                           - No specimens collected. On 01/23/2017 she underwent an EGD which revealed:  Impression:               - Normal esophagus.                           - Z-line irregular, 36 cm from the incisors.                           - 3 cm hiatal hernia.                           - Non-bleeding gastric ulcer with no stigmata of  bleeding.                           - Retained food in the duodenum. It was removed and                            dropped into the stomach.                           - One non-bleeding duodenal ulcer with no stigmata                            of bleeding.                           - Acquired duodenal stenosis. Dilated.                           - Normal second portion of the duodenum.                         - Non-bleeding duodenal diverticulum. She is for a repeat in 4 weeks.    Review of Systems Past Medical History:  Diagnosis Date  . Anxiety   . Depression   . Essential hypertension, benign 12/26/2016  . Fibroadenoma of right breast    S/P LUMPECTOMY W/ RADIOACTIVE SEED IMPLANT 05-19-2015  . Fibromyalgia   . GERD (gastroesophageal reflux disease)   . History of cervical dysplasia    s/p conzation and recurrent s/p  vaginal hysterectomy 2008  . History of duodenal ulcer    1992--  S/P  REPAIR PERFORATED ULCER  . History of gastric ulcer    age 79  . Hypertension   . IBS (irritable bowel syndrome)   . VIN II (vulvar intraepithelial neoplasia II)   . Wears glasses   . Wears partial dentures    lower    Past Surgical History:  Procedure Laterality Date  . BLADDER SLING PROCEDURE  2005  approx.  . CERVICAL CONIZATION W/BX  2006  approx  . CO2 LASER APPLICATION N/A 4/40/3474   Procedure: CO2 LASER OF THE VAGINA;  Surgeon: Everitt Amber, MD;  Location: [facility];  Service: Gynecology;  Laterality: N/A;  . ESOPHAGOGASTRODUODENOSCOPY N/A 01/21/2017   Procedure: ESOPHAGOGASTRODUODENOSCOPY (EGD);  Surgeon: Rogene Houston, MD;  Location: [facility];  Service: Endoscopy;  Laterality: N/A;  . ESOPHAGOGASTRODUODENOSCOPY (EGD) WITH PROPOFOL N/A 01/23/2017   Procedure: ESOPHAGOGASTRODUODENOSCOPY (EGD) WITH PROPOFOL;  Surgeon: Rogene Houston, MD;  Location: [facility];  Service: Endoscopy;  Laterality: N/A;  duodenal striture dilation  . RADIOACTIVE SEED GUIDED EXCISIONAL BREAST BIOPSY Right 05/19/2015   Procedure: EXCISIONAL RIGHT BREAST MASS WITH RADIOACTIVE SEED LOCALIZATION;  Surgeon: Fanny Skates, MD;  Location: [facility];  Service: General;  Laterality: Right;  . REPAIR OF PERFORATED ULCER  1992   duodenal  . VAGINAL HYSTERECTOMY  11/13/2006    Allergies  Allergen Reactions  . Hydrocodone Other (See Comments)    Nausea,  feels hot and insomia  . Sulfa Antibiotics Hives    Current Outpatient Prescriptions on File Prior to Visit  Medication Sig Dispense Refill  . amLODipine (NORVASC) 10 MG tablet Take 10 mg by mouth every morning.     Marland Kitchen  LYSINE PO Take by mouth.    . Multiple Vitamins-Minerals (CENTRUM ADULTS) TABS Take by mouth.    . pantoprazole (PROTONIX) 40 MG tablet Take 1 tablet (40 mg total) by mouth 2 (two) times daily. 60 tablet 2  . potassium chloride (K-DUR,KLOR-CON) 10 MEQ tablet Take 10 mEq by mouth every morning.    . traMADol (ULTRAM) 50 MG tablet Take 50 mg by mouth every 6 (six) hours as needed for moderate pain.    . valACYclovir (VALTREX) 1000 MG tablet Take 1,000 mg by mouth daily.  5   No current facility-administered medications on file prior to visit.        Objective:   Physical Exam Blood pressure 100/60, pulse 64, temperature 98.6 F (37 C), height 5\' 4"  (1.626 m), weight 108 lb 9.6 oz (49.3 kg).  Alert and oriented. Skin warm and dry. Oral mucosa is moist.   . Sclera anicteric, conjunctivae is pink. Thyroid not enlarged. No cervical lymphadenopathy. Lungs clear. Heart regular rate and rhythm.  Abdomen is soft. Bowel sounds are positive. No hepatomegaly. No abdominal masses felt. No tenderness.  No edema to lower extremities.  .       Assessment & Plan:  Back pain/constipation. Asymptomatic this pm. She will continue the Miralax daily.  Repeat EGD in 4 weeks.

## 2017-01-30 ENCOUNTER — Telehealth (INDEPENDENT_AMBULATORY_CARE_PROVIDER_SITE_OTHER): Payer: Self-pay | Admitting: Internal Medicine

## 2017-01-30 NOTE — Telephone Encounter (Signed)
Patient called, requesting a note be faxed to [facility], attention Amber to [phone] to let them know that she is okay to go back to work.  Ok to give?  [phone]

## 2017-01-30 NOTE — Telephone Encounter (Signed)
yes

## 2017-02-08 ENCOUNTER — Encounter (INDEPENDENT_AMBULATORY_CARE_PROVIDER_SITE_OTHER): Payer: Self-pay | Admitting: Internal Medicine

## 2017-02-10 NOTE — Telephone Encounter (Signed)
The note was faxed to the patient's employer last week.

## 2017-02-13 ENCOUNTER — Other Ambulatory Visit: Payer: Self-pay | Admitting: Obstetrics & Gynecology

## 2017-02-18 LAB — CYTOLOGY - PAP

## 2017-02-25 ENCOUNTER — Encounter: Payer: Self-pay | Admitting: Gynecologic Oncology

## 2017-02-28 ENCOUNTER — Encounter (INDEPENDENT_AMBULATORY_CARE_PROVIDER_SITE_OTHER): Payer: Self-pay | Admitting: Internal Medicine

## 2017-03-02 ENCOUNTER — Encounter (INDEPENDENT_AMBULATORY_CARE_PROVIDER_SITE_OTHER): Payer: Self-pay | Admitting: Internal Medicine

## 2017-03-26 ENCOUNTER — Encounter: Payer: Self-pay | Admitting: Gynecologic Oncology

## 2017-03-29 ENCOUNTER — Encounter (INDEPENDENT_AMBULATORY_CARE_PROVIDER_SITE_OTHER): Payer: Self-pay | Admitting: Internal Medicine

## 2017-03-31 ENCOUNTER — Telehealth (INDEPENDENT_AMBULATORY_CARE_PROVIDER_SITE_OTHER): Payer: Self-pay | Admitting: *Deleted

## 2017-03-31 ENCOUNTER — Encounter (INDEPENDENT_AMBULATORY_CARE_PROVIDER_SITE_OTHER): Payer: Self-pay | Admitting: *Deleted

## 2017-03-31 NOTE — Telephone Encounter (Signed)
[name]. Yes I'm scheduled for July 11 th for a tube down my throat at 12 .I'm have to reschedule.I need one on a Thursday.I don't know if they have one on the 12 th or not.but can u try to get me set up the next week if possible.u can call me [phone].I'm working and if I don't answer leave a message.I will call back.if it's going to be another 2 months before he can do it.just leave the 11 th appointment .don't cancel it untill  Unless he can do it the coming up Thursday 12 th.or the next week on Thursday.check first before u cancel appt.thank u [name]    Forwarded to Lacretia Nicks , Referral Coordinator.

## 2017-03-31 NOTE — Telephone Encounter (Signed)
Sent MyChart message to patient advising we don't have any availability fir July 12 and we kept her on for July 11th

## 2017-04-01 ENCOUNTER — Other Ambulatory Visit (INDEPENDENT_AMBULATORY_CARE_PROVIDER_SITE_OTHER): Payer: Self-pay | Admitting: *Deleted

## 2017-04-01 DIAGNOSIS — K253 Acute gastric ulcer without hemorrhage or perforation: Secondary | ICD-10-CM

## 2017-04-13 ENCOUNTER — Encounter (INDEPENDENT_AMBULATORY_CARE_PROVIDER_SITE_OTHER): Payer: Self-pay | Admitting: Internal Medicine

## 2017-04-26 ENCOUNTER — Other Ambulatory Visit (INDEPENDENT_AMBULATORY_CARE_PROVIDER_SITE_OTHER): Payer: Self-pay | Admitting: Internal Medicine

## 2017-05-24 ENCOUNTER — Encounter (HOSPITAL_COMMUNITY): Payer: Self-pay | Admitting: Emergency Medicine

## 2017-05-24 ENCOUNTER — Emergency Department (HOSPITAL_COMMUNITY): Payer: Medicaid Other

## 2017-05-24 ENCOUNTER — Emergency Department (HOSPITAL_COMMUNITY)
Admission: EM | Admit: 2017-05-24 | Discharge: 2017-05-24 | Disposition: A | Discharge status: Home / Self Care | Payer: Medicaid Other | Attending: Emergency Medicine | Admitting: Emergency Medicine

## 2017-05-24 DIAGNOSIS — Z79899 Other long term (current) drug therapy: Secondary | ICD-10-CM | POA: Insufficient documentation

## 2017-05-24 DIAGNOSIS — F17210 Nicotine dependence, cigarettes, uncomplicated: Secondary | ICD-10-CM | POA: Insufficient documentation

## 2017-05-24 DIAGNOSIS — M26621 Arthralgia of right temporomandibular joint: Secondary | ICD-10-CM | POA: Insufficient documentation

## 2017-05-24 DIAGNOSIS — K0889 Other specified disorders of teeth and supporting structures: Secondary | ICD-10-CM | POA: Diagnosis present

## 2017-05-24 DIAGNOSIS — I10 Essential (primary) hypertension: Secondary | ICD-10-CM | POA: Insufficient documentation

## 2017-05-24 NOTE — Discharge Instructions (Signed)
Follow-up with your dentist for recheck

## 2017-05-24 NOTE — ED Triage Notes (Signed)
Patient complaining of upper right dental pain after dental surgery 2 weeks ago.

## 2017-05-24 NOTE — ED Provider Notes (Signed)
[facility] DEPT Provider Note   CSN: [id number] Arrival date & time: 05/24/17  1425     History   Chief Complaint Chief Complaint  Patient presents with  . Dental Pain    HPI [name] is a 51 y.o. female.  HPI   [name] is a 51 y.o. female who presents to the Emergency Department complaining of right sided jaw pain and post dental extraction pain.  She had a right upper molar extracted 2 weeks ago and has continued to have sharp, stabbing pain from her right upper jaw to her right face and ear.  Pain is constant and worsens with chewing, hot and cold liquids and foods.  She denies facial swelling, bleeding of the gums, neck pain, fever and chills.  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 Recently completed antibiotics without relief.   Past Medical History:  Diagnosis Date  . Anxiety   . Depression   . Essential hypertension, benign 12/26/2016  . Fibroadenoma of right breast    S/P LUMPECTOMY W/ RADIOACTIVE SEED IMPLANT 05-19-2015  . Fibromyalgia   . GERD (gastroesophageal reflux disease)   . History of cervical dysplasia    s/p conzation and recurrent s/p  vaginal hysterectomy 2008  . History of duodenal ulcer    1992--  S/P  REPAIR PERFORATED ULCER  . History of gastric ulcer    age 60  . Hypertension   . IBS (irritable bowel syndrome)   . VIN II (vulvar intraepithelial neoplasia II)   . Wears glasses   . Wears partial dentures    lower    Patient Active Problem List   Diagnosis Date Noted  . Abdominal pain 01/21/2017  . History of gastric ulcer 01/21/2017  . Hypertension 01/21/2017  . Fibromyalgia 01/21/2017  . Essential hypertension, benign 12/26/2016  . Abdominal pain, epigastric 12/26/2016  . 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 Fibroadenoma of right breast 05/19/2015    Past Surgical History:  Procedure Laterality Date  . BLADDER SLING PROCEDURE  2005  approx.  .  CERVICAL CONIZATION W/BX  2006  approx  . CO2 LASER APPLICATION N/A 4/68/0321   Procedure: CO2 LASER OF THE VAGINA;  Surgeon: Everitt Amber, MD;  Location: [facility];  Service: Gynecology;  Laterality: N/A;  . ESOPHAGOGASTRODUODENOSCOPY N/A 01/21/2017   Procedure: ESOPHAGOGASTRODUODENOSCOPY (EGD);  Surgeon: Rogene Houston, MD;  [facility];  Service: Endoscopy;  Laterality: N/A;  . ESOPHAGOGASTRODUODENOSCOPY (EGD) WITH PROPOFOL N/A 01/23/2017   Procedure: ESOPHAGOGASTRODUODENOSCOPY (EGD) WITH PROPOFOL;  Surgeon: Rogene Houston, MD;  [facility];  Service: Endoscopy;  Laterality: N/A;  duodenal striture dilation  . RADIOACTIVE SEED GUIDED EXCISIONAL BREAST BIOPSY Right 05/19/2015   Procedure: EXCISIONAL RIGHT BREAST MASS WITH RADIOACTIVE SEED LOCALIZATION;  Surgeon: Fanny Skates, MD;  Location: [facility];  Service: General;  Laterality: Right;  . REPAIR OF PERFORATED ULCER  1992   duodenal  . VAGINAL HYSTERECTOMY  11/13/2006    OB History    No data available       Home Medications    Prior to Admission medications   Medication Sig Start Date End Date Taking? Authorizing Provider  amLODipine (NORVASC) 10 MG tablet Take 10 mg by mouth every morning.    Yes [provider]  LYSINE PO Take 1 capsule by mouth daily.    Yes [provider]  meloxicam (MOBIC) 15 MG  tablet TAKE ONE TABLET BY MOUTH ONCE DAILY.   Yes [provider]  Multiple Vitamins-Minerals (CENTRUM ADULTS) TABS Take by mouth.   Yes [provider]  pantoprazole (PROTONIX) 40 MG tablet TAKE ONE TABLET BY MOUTH TWICE DAILY. 04/28/17  Yes Rehman, Mechele Dawley, MD  potassium chloride (K-DUR,KLOR-CON) 10 MEQ tablet Take 10 mEq by mouth every morning.   Yes [provider]  traMADol (ULTRAM) 50 MG tablet Take 50 mg by mouth every 6 (six) hours as needed for moderate pain.   Yes [provider]  valACYclovir (VALTREX) 1000 MG  tablet Take 1,000 mg by mouth daily. 07/26/16  Yes [provider]  clindamycin (CLEOCIN) 300 MG capsule clindamycin HCl 300 mg capsule  TAKE ONE CAPSULE BY MOUTH EVERY 6 HOURS FOR 7 DAYS    [provider]    Family History Family History  Problem Relation Age of Onset  . Hypertension Mother   . Clotting disorder Father   . Diabetes Father   . Hypertension Father   . Hypertension Sister   . Hypertension Brother   . Hypertension Maternal Aunt   . Hypertension Maternal Uncle   . Hypertension Paternal Aunt   . Hypertension Paternal Uncle     Social History Social History  Substance Use Topics  . Smoking status: Current Every Day Smoker    Packs/day: 1.00    Years: 18.00    Types: Cigarettes  . Smokeless tobacco: Never Used  . Alcohol use No     Allergies   Hydrocodone and Sulfa antibiotics   Review of Systems Review of Systems  Constitutional: Negative for appetite change and fever.  HENT: Positive for dental problem. Negative for congestion, facial swelling, sore throat and trouble swallowing.   Eyes: Negative for pain and visual disturbance.  Respiratory: Negative for shortness of breath.   Cardiovascular: Negative for chest pain.  Musculoskeletal: Negative for neck pain and neck stiffness.  Neurological: Negative for dizziness, facial asymmetry and headaches.  Hematological: Negative for adenopathy.  All other systems reviewed and are negative.    Physical Exam Updated Vital Signs BP 127/86 (BP Location: Right Arm)   Pulse 88   Temp 98.1 F (36.7 C) (Oral)   Resp 16   Ht 5\' 4"  (1.626 m)   Wt 47.6 kg (105 lb)   SpO2 97%   BMI 18.02 kg/m   Physical Exam  Constitutional: She is oriented to person, place, and time. She appears well-developed and well-nourished. No distress.  HENT:  Head: Normocephalic and atraumatic.  Right Ear: Tympanic membrane and ear canal normal.  Left Ear: Tympanic membrane and ear canal normal.  Mouth/Throat:  Uvula is midline, oropharynx is clear and moist and mucous membranes are normal. No trismus in the jaw. Normal dentition. No dental abscesses or uvula swelling.  ttp of the gingiva of the right upper posterior jaw. Mild edema.  No facial swelling, obvious dental abscess, trismus, or sublingual abnml.    Neck: Normal range of motion. Neck supple.  Cardiovascular: Normal rate, regular rhythm and normal heart sounds.   No murmur heard. Pulmonary/Chest: Effort normal and breath sounds normal.  Musculoskeletal: Normal range of motion.  Lymphadenopathy:    She has no cervical adenopathy.  Neurological: She is alert and oriented to person, place, and time. She exhibits normal muscle tone. Coordination normal.  Skin: Skin is warm and dry.  Psychiatric: She has a normal mood and affect.  Nursing note and vitals reviewed.    ED Treatments /  Results  Labs (all labs ordered are listed, but only abnormal results are displayed) Labs Reviewed - No data to display  EKG  EKG Interpretation None       Radiology Ct Maxillofacial Wo Contrast  Result Date: 05/24/2017 CLINICAL DATA:  Upper right dental pain after dental surgery 2 weeks ago. EXAM: CT MAXILLOFACIAL WITHOUT CONTRAST TECHNIQUE: Multidetector CT imaging of the maxillofacial structures was performed. Multiplanar CT image reconstructions were also generated. COMPARISON:  None. FINDINGS: Osseous: Multiple missing upper and lower teeth bilaterally. No cavities seen in the remaining teeth and no periapical lucencies demonstrated. Orbits: Negative. No traumatic or inflammatory finding. Sinuses: Clear. Soft tissues: Unremarkable.  No soft tissue swelling or abscess. Limited intracranial: Normal. IMPRESSION: No acute abnormality. Electronically Signed   By: Claudie Revering M.D.   On: 05/24/2017 15:43    Procedures Procedures (including critical care time)  Medications Ordered in ED Medications - No data to display   Initial Impression / Assessment  and Plan / ED Course  I have reviewed the triage vital signs and the nursing notes.  Pertinent labs & imaging results that were available during my care of the patient were reviewed by me and considered in my medical decision making (see chart for details).     Pt well appearing.  Airway patent.  No malocclusion.  CT reassuring.  No concerning sx's for infection.  Pt agrees to continue her current pain medication, close f/u with her dentist.    Final Clinical Impressions(s) / ED Diagnoses   Final diagnoses:  Arthralgia of right temporomandibular joint    New Prescriptions Discharge Medication List as of 05/24/2017  3:51 PM       Kem Parkinson, PA-C 05/25/17 1340    Dorie Rank, MD 05/28/17 1117

## 2017-05-27 ENCOUNTER — Encounter (INDEPENDENT_AMBULATORY_CARE_PROVIDER_SITE_OTHER): Payer: Self-pay | Admitting: Internal Medicine

## 2017-05-28 ENCOUNTER — Encounter (INDEPENDENT_AMBULATORY_CARE_PROVIDER_SITE_OTHER): Payer: Self-pay | Admitting: *Deleted

## 2017-06-10 ENCOUNTER — Ambulatory Visit (INDEPENDENT_AMBULATORY_CARE_PROVIDER_SITE_OTHER): Payer: Medicaid Other | Admitting: Internal Medicine

## 2017-06-12 ENCOUNTER — Encounter (INDEPENDENT_AMBULATORY_CARE_PROVIDER_SITE_OTHER): Payer: Self-pay | Admitting: Internal Medicine

## 2017-06-13 ENCOUNTER — Encounter (INDEPENDENT_AMBULATORY_CARE_PROVIDER_SITE_OTHER): Payer: Self-pay | Admitting: *Deleted

## 2017-06-26 ENCOUNTER — Encounter (INDEPENDENT_AMBULATORY_CARE_PROVIDER_SITE_OTHER): Payer: Self-pay | Admitting: Internal Medicine

## 2017-06-30 ENCOUNTER — Telehealth: Payer: Self-pay | Admitting: *Deleted

## 2017-06-30 ENCOUNTER — Ambulatory Visit: Payer: Medicaid Other | Admitting: Neurology

## 2017-06-30 NOTE — Telephone Encounter (Signed)
No showed new patient appointment. 

## 2017-07-01 ENCOUNTER — Encounter: Payer: Self-pay | Admitting: Neurology

## 2017-07-15 ENCOUNTER — Other Ambulatory Visit (INDEPENDENT_AMBULATORY_CARE_PROVIDER_SITE_OTHER): Payer: Self-pay | Admitting: Internal Medicine

## 2017-07-15 DIAGNOSIS — G8929 Other chronic pain: Secondary | ICD-10-CM

## 2017-07-15 DIAGNOSIS — R1013 Epigastric pain: Principal | ICD-10-CM

## 2017-07-17 ENCOUNTER — Telehealth (INDEPENDENT_AMBULATORY_CARE_PROVIDER_SITE_OTHER): Payer: Self-pay | Admitting: *Deleted

## 2017-07-17 ENCOUNTER — Ambulatory Visit (HOSPITAL_COMMUNITY)
Admission: RE | Admit: 2017-07-17 | Discharge: 2017-07-17 | Disposition: A | Discharge status: Home / Self Care | Payer: Medicaid Other | Source: Ambulatory Visit | Attending: Internal Medicine | Admitting: Internal Medicine

## 2017-07-17 ENCOUNTER — Other Ambulatory Visit (INDEPENDENT_AMBULATORY_CARE_PROVIDER_SITE_OTHER): Payer: Self-pay | Admitting: *Deleted

## 2017-07-17 ENCOUNTER — Encounter (HOSPITAL_COMMUNITY)
Admission: RE | Disposition: A | Discharge status: Home / Self Care | Payer: Self-pay | Source: Ambulatory Visit | Attending: Internal Medicine

## 2017-07-17 ENCOUNTER — Encounter (HOSPITAL_COMMUNITY): Payer: Self-pay

## 2017-07-17 DIAGNOSIS — K210 Gastro-esophageal reflux disease with esophagitis: Secondary | ICD-10-CM | POA: Insufficient documentation

## 2017-07-17 DIAGNOSIS — K269 Duodenal ulcer, unspecified as acute or chronic, without hemorrhage or perforation: Secondary | ICD-10-CM | POA: Insufficient documentation

## 2017-07-17 DIAGNOSIS — M797 Fibromyalgia: Secondary | ICD-10-CM | POA: Insufficient documentation

## 2017-07-17 DIAGNOSIS — K279 Peptic ulcer, site unspecified, unspecified as acute or chronic, without hemorrhage or perforation: Secondary | ICD-10-CM

## 2017-07-17 DIAGNOSIS — B3781 Candidal esophagitis: Secondary | ICD-10-CM | POA: Diagnosis not present

## 2017-07-17 DIAGNOSIS — F17210 Nicotine dependence, cigarettes, uncomplicated: Secondary | ICD-10-CM | POA: Diagnosis not present

## 2017-07-17 DIAGNOSIS — K449 Diaphragmatic hernia without obstruction or gangrene: Secondary | ICD-10-CM

## 2017-07-17 DIAGNOSIS — I10 Essential (primary) hypertension: Secondary | ICD-10-CM | POA: Diagnosis not present

## 2017-07-17 DIAGNOSIS — Z79899 Other long term (current) drug therapy: Secondary | ICD-10-CM | POA: Insufficient documentation

## 2017-07-17 DIAGNOSIS — K589 Irritable bowel syndrome without diarrhea: Secondary | ICD-10-CM | POA: Insufficient documentation

## 2017-07-17 DIAGNOSIS — R1013 Epigastric pain: Secondary | ICD-10-CM

## 2017-07-17 DIAGNOSIS — K5710 Diverticulosis of small intestine without perforation or abscess without bleeding: Secondary | ICD-10-CM | POA: Diagnosis not present

## 2017-07-17 DIAGNOSIS — G8929 Other chronic pain: Secondary | ICD-10-CM | POA: Insufficient documentation

## 2017-07-17 DIAGNOSIS — Z8719 Personal history of other diseases of the digestive system: Secondary | ICD-10-CM | POA: Insufficient documentation

## 2017-07-17 DIAGNOSIS — K259 Gastric ulcer, unspecified as acute or chronic, without hemorrhage or perforation: Secondary | ICD-10-CM | POA: Diagnosis not present

## 2017-07-17 HISTORY — PX: ESOPHAGOGASTRODUODENOSCOPY: SHX5428

## 2017-07-17 LAB — KOH PREP

## 2017-07-17 SURGERY — EGD (ESOPHAGOGASTRODUODENOSCOPY)
Anesthesia: Moderate Sedation

## 2017-07-17 MED ORDER — LIDOCAINE VISCOUS 2 % MT SOLN
OROMUCOSAL | Status: AC
  Filled 2017-07-17: qty 15

## 2017-07-17 MED ORDER — SODIUM CHLORIDE 0.9 % IV SOLN
INTRAVENOUS | Status: DC
  Administered 2017-07-17: 20 mL/h via INTRAVENOUS

## 2017-07-17 MED ORDER — MIDAZOLAM HCL 5 MG/5ML IJ SOLN
INTRAMUSCULAR | Status: AC
  Filled 2017-07-17: qty 10

## 2017-07-17 MED ORDER — MEPERIDINE HCL 50 MG/ML IJ SOLN
INTRAMUSCULAR | Status: DC | PRN
  Administered 2017-07-17 (×2): 25 mg via INTRAVENOUS

## 2017-07-17 MED ORDER — LIDOCAINE VISCOUS 2 % MT SOLN
OROMUCOSAL | Status: DC | PRN
  Administered 2017-07-17: 1 via OROMUCOSAL

## 2017-07-17 MED ORDER — MEPERIDINE HCL 50 MG/ML IJ SOLN
INTRAMUSCULAR | Status: AC
  Filled 2017-07-17: qty 1

## 2017-07-17 MED ORDER — SUCRALFATE 1 GM/10ML PO SUSP: 1.0000 g | Freq: Three times a day (TID) | ORAL | 0 refills | Status: DC

## 2017-07-17 MED ORDER — MIDAZOLAM HCL 5 MG/5ML IJ SOLN
INTRAMUSCULAR | Status: DC | PRN
  Administered 2017-07-17 (×4): 2 mg via INTRAVENOUS

## 2017-07-17 MED ORDER — SODIUM CHLORIDE 0.9% FLUSH
INTRAVENOUS | Status: AC
  Filled 2017-07-17: qty 10

## 2017-07-17 NOTE — Telephone Encounter (Signed)
Endo nurse and ask if we could reach patient to come and get some additional labs drawn.  Order is in the computer already.   They just didn't have enough blood to run the test.  Called patient.  She will come to our lab upstairs tomorrow or next couple of days.  Tammy will make sure the order is good for [facility].

## 2017-07-17 NOTE — H&P (Signed)
[name] is an 51 y.o. female.   Chief Complaint: Patient is here for EGD. HPI: She is 51 year old Caucasian female was history of gastric ulcer. She was hospitalized with upper GI bleed back in May 2018 and found to have gastric ulcer at antrum. H. pylori serology was negative. She's been maintaining PPI. She's not taking NSAIDs. Lately she's been having more epigastric pain with nausea. She also complains of back pain. She says she has a good appetite but does not understand why she cannot gain weight. She denies melena or rectal bleeding. She continues to smoke cigarettes. She has cut back to one pack per day.  Past Medical History:  Diagnosis Date  . Anxiety   . Depression   . Essential hypertension, benign 12/26/2016  . Fibroadenoma of right breast    S/P LUMPECTOMY W/ RADIOACTIVE SEED IMPLANT 05-19-2015  . Fibromyalgia   . GERD (gastroesophageal reflux disease)   . History of cervical dysplasia    s/p conzation and recurrent s/p  vaginal hysterectomy 2008  . History of duodenal ulcer    1992--  S/P  REPAIR PERFORATED ULCER  . History of gastric ulcer    age 55  . Hypertension   . IBS (irritable bowel syndrome)   . VIN II (vulvar intraepithelial neoplasia II)   . Wears glasses   . Wears partial dentures    lower    Past Surgical History:  Procedure Laterality Date  . BLADDER SLING PROCEDURE  2005  approx.  . CERVICAL CONIZATION W/BX  2006  approx  . CO2 LASER APPLICATION N/A 6/83/4196   Procedure: CO2 LASER OF THE VAGINA;  Surgeon: Everitt Amber, MD;  Location: [facility];  Service: Gynecology;  Laterality: N/A;  . ESOPHAGOGASTRODUODENOSCOPY N/A 01/21/2017   Procedure: ESOPHAGOGASTRODUODENOSCOPY (EGD);  Surgeon: Rogene Houston, MD;  [facility];  Service: Endoscopy;  Laterality: N/A;  . ESOPHAGOGASTRODUODENOSCOPY (EGD) WITH PROPOFOL N/A 01/23/2017   Procedure: ESOPHAGOGASTRODUODENOSCOPY (EGD) WITH PROPOFOL;  Surgeon: Rogene Houston, MD;  [facility];  Service: Endoscopy;  Laterality: N/A;  duodenal striture dilation  . RADIOACTIVE SEED GUIDED EXCISIONAL BREAST BIOPSY Right 05/19/2015   Procedure: EXCISIONAL RIGHT BREAST MASS WITH RADIOACTIVE SEED LOCALIZATION;  Surgeon: Fanny Skates, MD;  Location: [facility];  Service: General;  Laterality: Right;  . REPAIR OF PERFORATED ULCER  1992   duodenal  . VAGINAL HYSTERECTOMY  11/13/2006    Family History  Problem Relation Age of Onset  . Hypertension Mother   . Clotting disorder Father   . Diabetes Father   . Hypertension Father   . Hypertension Sister   . Hypertension Brother   . Hypertension Maternal Aunt   . Hypertension Maternal Uncle   . Hypertension Paternal Aunt   . Hypertension Paternal Uncle    Social History:  reports that she has been smoking Cigarettes.  She has a 18.00 pack-year smoking history. She has never used smokeless tobacco. She reports that she does not drink alcohol or use drugs.  Allergies:  Allergies  Allergen Reactions  . Hydrocodone Other (See Comments)    Nausea, feels hot and insomia  . Sulfa Antibiotics Hives    Medications Prior to Admission  Medication Sig Dispense Refill  . amLODipine (NORVASC) 10 MG tablet Take 10 mg by mouth every morning.     Marland Kitchen LYSINE PO Take 1 capsule by mouth daily.     . Multiple Vitamins-Minerals (CENTRUM ADULTS) TABS Take 1 tablet by mouth  daily.     . pantoprazole (PROTONIX) 40 MG tablet TAKE ONE TABLET BY MOUTH TWICE DAILY. (Patient taking differently: Take 40 mg by mouth twice daily) 60 tablet 3  . potassium chloride (K-DUR,KLOR-CON) 10 MEQ tablet Take 10 mEq by mouth every morning.    . traMADol (ULTRAM) 50 MG tablet Take 50 mg by mouth every 6 (six) hours as needed for moderate pain.    . valACYclovir (VALTREX) 1000 MG tablet Take 1,000 mg by mouth daily.  5    No results found for this or any previous visit (from the past 48 hour(s)). No results found.  ROS  Blood pressure  113/82, pulse 92, temperature 98.7 F (37.1 C), temperature source Oral, resp. rate 20, height 5\' 4"  (1.626 m), weight 105 lb (47.6 kg), SpO2 98 %. Physical Exam  Constitutional:  Well-developed thin Caucasian female in NAD.  HENT:  Mouth/Throat: Oropharynx is clear and moist.  Eyes: Conjunctivae are normal. No scleral icterus.  Neck: No thyromegaly present.  Cardiovascular: Normal rate, regular rhythm and normal heart sounds.   No murmur heard. Respiratory: Effort normal and breath sounds normal.  GI:  Abdomen is symmetrical and soft with mild tenderness across upper abdomen. It is most pronounced in midepigastric region. No organomegaly or masses.  Musculoskeletal: She exhibits no edema.  Lymphadenopathy:    She has no cervical adenopathy.  Neurological: She is alert.  Skin: Skin is warm and dry.     Assessment/Plan Epigastric pain and nausea. History of gastric ulcer. Diagnostic EGD.  Hildred Laser, MD 07/17/2017, 11:36 AM

## 2017-07-17 NOTE — Discharge Instructions (Signed)
Resume usual medications and diet. Sucralfate 1 g by mouth at least 30 minutes before each meal and bedtime daily. No driving for 24 hours. Physician will call with results of biopsy, brushing and blood test.  Peptic Ulcer A peptic ulcer is a painful sore in the lining of your esophagus, stomach, or the first part of your small intestine. You may have pain in the area between your chest and your belly button. The most common causes of an ulcer are:  An infection.  Using certain pain medicines too often or too much.  Follow these instructions at home:  Avoid alcohol.  Avoid caffeine.  Do not use any tobacco products. These include cigarettes, chewing tobacco, and e-cigarettes. If you need help quitting, ask your doctor.  Take over-the-counter and prescription medicines only as told by your doctor. Do not stop or change your medicines unless you talk with your doctor about it first.  Keep all follow-up visits as told by your doctor. This is important. Contact a doctor if:  You do not get better in 7 days after you start treatment.  You keep having an upset stomach (indigestion) or heartburn. Get help right away if:  You have sudden, sharp pain in your belly (abdomen).  You have lasting belly pain.  You have bloody poop (stool) or black, tarry poop.  You throw up (vomit) blood. It may look like coffee grounds.  You feel light-headed or feel like you may pass out (faint).  You get weak.  You get sweaty or feel sticky and cold to the touch (clammy). This information is not intended to replace advice given to you by your health care provider. Make sure you discuss any questions you have with your health care provider. Document Released: 12/04/2009 Document Revised: 01/24/2016 Document Reviewed: 06/10/2015 Elsevier Interactive Patient Education  2018 [employer].  Gastrin Test The gastrin test is a blood test used to check the level of gastrin in your blood. Gastrin is a  hormone that is made in your stomach by cells called G cells. Having the right amount of gastrin is important to aid in food digestion. However, if your body makes too much gastrin, you are at risk for peptic ulcer disease. This is a condition in which you develop repeated peptic, or stomach, ulcers over time. Certain conditions can cause excess gastrin production, including tumors of the pancreas (Zollinger-Ellison syndrome) and overgrowth of G cells (G cell hyperplasia). Your health care provider may recommend that you have the gastrin test to help determine the cause of peptic ulcer disease. How do I prepare for this test? Do not eat or drink anything other than water for 12 hours before the test or as directed by your health care provider. Avoid alcohol for at least 24 hours before the test. What do the results mean? It is your responsibility to obtain your test results. Ask the lab or department performing the test when and how you will get your results. Contact your health care provider to discuss any questions you have about your results. Range of Normal Values Ranges for normal values may vary among different labs and hospitals. You should always check with your health care provider after having lab work or other tests done to discuss whether your values are considered within normal limits. Adult: 0-180 pg/mL or 0-180 ng/L (SI units). Child: 0-125 pg/mL. Levels may be higher in elderly people and people with diabetes who are experiencing low blood sugar (hypoglycemia) at the time of the test.  Some medicines and substances can affect your gastrin levels. Those that can make your gastrin levels higher than normal may include:  Medicines that reduce stomach acid, such as: ? Antacids. ? H2 blockers.  Calcium supplements.  Catecholamines.  Caffeine.  Medicines that can make your gastrin levels lower than normal may include:  Anticholinergics.  Tricyclic antidepressants.  Meaning of  Results Outside Normal Value Ranges A gastrin level that is too high can indicate a number of health problems. These may include:  Zollinger-Ellison syndrome.  G cell hyperplasia.  Pernicious anemia. This is when your body makes an abnormally low number of red blood cells due to a vitamin B12 deficiency.  Atrophic gastritis.  Stomach cancer.  Long-term (chronic) kidney failure.  Gastric outlet obstruction. This is when digested food does not properly pass from your stomach into your small intestine.  Complications after gastric surgery.  Discuss the results of this test with your health care provider. He or she will use the results of this test to make a diagnosis and determine a treatment plan that is right for you. Talk with your health care provider to discuss your results, treatment options, and if necessary, the need for more tests. Talk with your health care provider if you have any questions about your results. This information is not intended to replace advice given to you by your health care provider. Make sure you discuss any questions you have with your health care provider. Document Released: 10/04/2004 Document Revised: 05/14/2016 Document Reviewed: 01/14/2014 Elsevier Interactive Patient Education  [employer].

## 2017-07-17 NOTE — Op Note (Signed)
[facility] Patient Name: [name] Procedure Date: 07/17/2017 11:21 AM MRN: [id number] Date of Birth: 1966/03/30 Attending MD: Hildred Laser , MD CSN: [id number] Age: 51 Admit Type: Outpatient Procedure:                Upper GI endoscopy Indications:              Epigastric abdominal pain, Follow-up of gastric                            ulcer, Follow-up of duodenal ulcer Providers:                Hildred Laser, MD, Lurline Del, RN, Zoila Shutter,                            Technologist Referring MD:             Dione Housekeeper, MD Medicines:                Lidocaine spray, Meperidine 50 mg IV, Midazolam 8                            mg IV Complications:            No immediate complications. Estimated Blood Loss:     Estimated blood loss was minimal. Procedure:                Pre-Anesthesia Assessment:                           - Prior to the procedure, a History and Physical                            was performed, and patient medications and                            allergies were reviewed. The patient's tolerance of                            previous anesthesia was also reviewed. The risks                            and benefits of the procedure and the sedation                            options and risks were discussed with the patient.                            All questions were answered, and informed consent                            was obtained. Prior Anticoagulants: The patient has                            taken no previous anticoagulant or antiplatelet  agents. ASA Grade Assessment: II - A patient with                            mild systemic disease. After reviewing the risks                            and benefits, the patient was deemed in                            satisfactory condition to undergo the procedure.                           After obtaining informed consent, the endoscope was                            passed under  direct vision. Throughout the                            procedure, the patient's blood pressure, pulse, and                            oxygen saturations were monitored continuously. The                            EG-299Ol ([id number]) scope was introduced through the                            mouth, and advanced to the duodenal bulb. The upper                            GI endoscopy was accomplished without difficulty.                            The patient tolerated the procedure well. Scope In: 11:49:27 AM Scope Out: 12:00:18 PM Total Procedure Duration: 0 hours 10 minutes 51 seconds  Findings:      The proximal esophagus and mid esophagus were normal.      Patchy coating of mucosa was found in the distal esophagus. Cells for       cytology were obtained by brushing.      The Z-line was regular and was found 37 cm from the incisors.      A 2 cm hiatal hernia was present.      One non-bleeding cratered gastric ulcer was found in the prepyloric       region of the stomach. The lesion was five mm by ten mm in largest       dimension. Biopsies were taken with a cold forceps for histology.      The exam of the stomach was otherwise normal.      One non-bleeding superficial duodenal ulcer was found in the duodenal       bulb. The lesion was circumferential ulcer at distal bulb with       noncritical narrowing. mm in largest dimension.      A medium diverticulum was found in the duodenal bulb. Impression:               -  Normal proximal esophagus and mid esophagus.                           - Monilial esophagitis. Cells for cytology obtained.                           - Z-line regular, 37 cm from the incisors.                           - 2 cm hiatal hernia.                           - Non-bleeding gastric ulcer. Biopsied. Gastric                            ulcer has increased in dimensions since last                            examMay 2018.                           - One non-bleeding  duodenal ulcer. Duodenal                            ulcerith increased in dimensions since last exam of                            May 2018.                           - Duodenal diverticulum. Moderate Sedation:      Moderate (conscious) sedation was administered by the endoscopy nurse       and supervised by the endoscopist. The following parameters were       monitored: oxygen saturation, heart rate, blood pressure, CO2       capnography and response to care. Total physician intraservice time was       19 minutes. Recommendation:           - Patient has a contact number available for                            emergencies. The signs and symptoms of potential                            delayed complications were discussed with the                            patient. Return to normal activities tomorrow.                            Written discharge instructions were provided to the                            patient.                           -  Resume previous diet today.                           - Continue present medications.                           - Fasting gastrin level will be checked today.                           - Sucralfate 1 g by mouth before meals and daily at                            bedtime.                           - No aspirin, ibuprofen, naproxen, or other                            non-steroidal anti-inflammatory drugs.                           - Await pathology results.                           - Repeat study in 3 months. Procedure Code(s):        --- Professional ---                           [phone], Esophagogastroduodenoscopy, flexible,                            transoral; with biopsy, single or multiple                           99152, Moderate sedation services provided by the                            same physician or other qualified health care                            professional performing the diagnostic or                            therapeutic  service that the sedation supports,                            requiring the presence of an independent trained                            observer to assist in the monitoring of the                            patient's level of consciousness and physiological                            status; initial 15 minutes of intraservice  time,                            patient age 44 years or older Diagnosis Code(s):        --- Professional ---                           K44.9, Diaphragmatic hernia without obstruction or                            gangrene                           K25.9, Gastric ulcer, unspecified as acute or                            chronic, without hemorrhage or perforation                           K26.9, Duodenal ulcer, unspecified as acute or                            chronic, without hemorrhage or perforation                           R10.13, Epigastric pain                           K57.10, Diverticulosis of small intestine without                            perforation or abscess without bleeding CPT copyright 2016 [employer]. All rights reserved. The codes documented in this report are preliminary and upon coder review may  be revised to meet current compliance requirements. Hildred Laser, MD Hildred Laser, MD 07/17/2017 12:17:32 PM This report has been signed electronically. Number of Addenda: 0

## 2017-07-18 ENCOUNTER — Other Ambulatory Visit (INDEPENDENT_AMBULATORY_CARE_PROVIDER_SITE_OTHER): Payer: Self-pay | Admitting: Internal Medicine

## 2017-07-18 ENCOUNTER — Encounter (INDEPENDENT_AMBULATORY_CARE_PROVIDER_SITE_OTHER): Payer: Self-pay | Admitting: *Deleted

## 2017-07-18 ENCOUNTER — Other Ambulatory Visit (INDEPENDENT_AMBULATORY_CARE_PROVIDER_SITE_OTHER): Payer: Self-pay | Admitting: *Deleted

## 2017-07-18 DIAGNOSIS — K259 Gastric ulcer, unspecified as acute or chronic, without hemorrhage or perforation: Secondary | ICD-10-CM | POA: Insufficient documentation

## 2017-07-18 DIAGNOSIS — K269 Duodenal ulcer, unspecified as acute or chronic, without hemorrhage or perforation: Secondary | ICD-10-CM

## 2017-07-18 DIAGNOSIS — B9681 Helicobacter pylori [H. pylori] as the cause of diseases classified elsewhere: Secondary | ICD-10-CM | POA: Insufficient documentation

## 2017-07-18 LAB — GASTRIN: GASTRIN: 101 pg/mL (ref 0–115)

## 2017-07-18 MED ORDER — NYSTATIN 100000 UNIT/ML MT SUSP: 5.0000 mL | Freq: Four times a day (QID) | OROMUCOSAL | 0 refills | Status: DC

## 2017-07-19 ENCOUNTER — Encounter (INDEPENDENT_AMBULATORY_CARE_PROVIDER_SITE_OTHER): Payer: Self-pay | Admitting: Internal Medicine

## 2017-07-21 ENCOUNTER — Encounter (HOSPITAL_COMMUNITY): Payer: Self-pay | Admitting: Internal Medicine

## 2017-07-25 NOTE — Progress Notes (Signed)
Sent Reba a message for an appointment time.

## 2017-08-05 ENCOUNTER — Encounter (INDEPENDENT_AMBULATORY_CARE_PROVIDER_SITE_OTHER): Payer: Self-pay | Admitting: Internal Medicine

## 2017-08-11 ENCOUNTER — Encounter (INDEPENDENT_AMBULATORY_CARE_PROVIDER_SITE_OTHER): Payer: Self-pay | Admitting: Internal Medicine

## 2017-09-10 ENCOUNTER — Telehealth (INDEPENDENT_AMBULATORY_CARE_PROVIDER_SITE_OTHER): Payer: Self-pay | Admitting: Internal Medicine

## 2017-09-10 NOTE — Telephone Encounter (Signed)
I called the patient to remind her of her appointment for 09/11/17 with Dr. Laural Golden.  She asked to cancel it because she has an appointment with Dr. Nevada Crane.  She was fine rescheduling with Terri.  I rescheduled her for 09/18/17 at 3:45pm with Deberah Castle, NP.

## 2017-09-11 ENCOUNTER — Ambulatory Visit (INDEPENDENT_AMBULATORY_CARE_PROVIDER_SITE_OTHER): Payer: Medicaid Other | Admitting: Internal Medicine

## 2017-09-18 ENCOUNTER — Encounter (INDEPENDENT_AMBULATORY_CARE_PROVIDER_SITE_OTHER): Payer: Self-pay | Admitting: Internal Medicine

## 2017-09-18 ENCOUNTER — Ambulatory Visit (INDEPENDENT_AMBULATORY_CARE_PROVIDER_SITE_OTHER): Payer: Medicaid Other | Admitting: Internal Medicine

## 2017-09-18 VITALS — BP 108/72 | HR 80 | Temp 98.0°F | Ht 64.0 in | Wt 103.1 lb

## 2017-09-18 DIAGNOSIS — K219 Gastro-esophageal reflux disease without esophagitis: Secondary | ICD-10-CM | POA: Diagnosis not present

## 2017-09-18 MED ORDER — SUCRALFATE 1 GM/10ML PO SUSP: 1.0000 g | Freq: Three times a day (TID) | ORAL | 3 refills | Status: DC

## 2017-09-18 NOTE — Patient Instructions (Signed)
Rx for Carafate sent to her pharmacy. Continue the Protonix.

## 2017-09-18 NOTE — Progress Notes (Signed)
Subjective:    Patient ID: [name], female    DOB: Oct 21, 1965, 51 y.o.   MRN: [id number]  HPI Here today for f/u. She was last seen in May by me for GERD.  Admitted to [facility] in May of this year with upper abdominal pain and coffee ground emesis. Hxof PUD dating back to1990 when she had surgery for perforated ulcer.  Underwent an EGD in October of 2018 for f/u of gastric ulcer.   Impression:               - Normal proximal esophagus and mid esophagus.                           - Monilial esophagitis. Cells for cytology obtained.                           - Z-line regular, 37 cm from the incisors.                           - 2 cm hiatal hernia.                           - Non-bleeding gastric ulcer. Biopsied. Gastric                            ulcer has increased in dimensions since last                            examMay 2018.                           - One non-bleeding duodenal ulcer. Duodenal                            ulcerith increased in dimensions since last exam of                            May 2018.                           - Duodenal diverticulum.  She is scheduled for an EGD in January of 2018. She is avoid spicy,greasy foods. She says she is having epigastric pian and pain under her ribs. She is sticking to a bland diet. She is drinking Mylanta as needed. Last weight in May was 108. Today her weight is 103.  Has a BM daily. No melena or BRRB.  Has not taken any Goody powders in 2 weeks. She was taking 1-2 daily for back pain.    She underwent an EGD 01/21/2017 which revealed Impression: - Normal esophagus. - Z-line regular. - A medium amount of food (residue) in the stomach. - Gastric ulcer with no stigmata of bleeding. - Normal cardia and pylorus. - One non-bleeding duodenal ulcer with no stigmata    of bleeding. - Normal upper third of esophagus and middle third  of esophagus. - Acquired duodenal stenosis. - No specimens collected. On 01/23/2017 she underwent an EGD which revealed:  Impression: - Normal esophagus. - Z-line irregular, 36 cm from the incisors. - 3 cm hiatal hernia. -  Non-bleeding gastric ulcer with no stigmata of  bleeding. - Retained food in the duodenum. It was removed and  dropped into the stomach. - One non-bleeding duodenal ulcer with no stigmata  of bleeding. - Acquired duodenal stenosis. Dilated. - Normal second portion of the duodenum. - Non-bleeding duodenal diverticulum. She is for a repeat in 4 weeks.  Review of Systems Past Medical History:  Diagnosis Date  . Anxiety   . Depression   . Essential hypertension, benign 12/26/2016  . Fibroadenoma of right breast    S/P LUMPECTOMY W/ RADIOACTIVE SEED IMPLANT 05-19-2015  . Fibromyalgia   . GERD (gastroesophageal reflux disease)   . History of cervical dysplasia    s/p conzation and recurrent s/p  vaginal hysterectomy 2008  . History of duodenal ulcer    1992--  S/P  REPAIR PERFORATED ULCER  . History of gastric ulcer    age 69  . Hypertension   . IBS (irritable bowel syndrome)   . VIN II (vulvar intraepithelial neoplasia II)   . Wears glasses   . Wears partial dentures    lower    Past Surgical History:  Procedure Laterality Date  . BLADDER SLING PROCEDURE  2005  approx.  . CERVICAL CONIZATION W/BX  2006  approx  . CO2 LASER APPLICATION N/A 2/37/6283   Procedure: CO2 LASER  OF THE VAGINA;  Surgeon: Everitt Amber, MD;  Location[facility];  Service: Gynecology;  Laterality: N/A;  . ESOPHAGOGASTRODUODENOSCOPY N/A 01/21/2017   Procedure: ESOPHAGOGASTRODUODENOSCOPY (EGD);  Surgeon: Rogene Houston, MD;  Location: [facility];  Service: Endoscopy;  Laterality: N/A;  . ESOPHAGOGASTRODUODENOSCOPY N/A 07/17/2017   Procedure: ESOPHAGOGASTRODUODENOSCOPY (EGD);  Surgeon: Rogene Houston, MD;  Location: [facility];  Service: Endoscopy;  Laterality: N/A;  1:00  . ESOPHAGOGASTRODUODENOSCOPY (EGD) WITH PROPOFOL N/A 01/23/2017   Procedure: ESOPHAGOGASTRODUODENOSCOPY (EGD) WITH PROPOFOL;  Surgeon: Rogene Houston, MD;  Location: [facility];  Service: Endoscopy;  Laterality: N/A;  duodenal striture dilation  . RADIOACTIVE SEED GUIDED EXCISIONAL BREAST BIOPSY Right 05/19/2015   Procedure: EXCISIONAL RIGHT BREAST MASS WITH RADIOACTIVE SEED LOCALIZATION;  Surgeon: Fanny Skates, MD;  Location: [facility];  Service: General;  Laterality: Right;  . REPAIR OF PERFORATED ULCER  1992   duodenal  . VAGINAL HYSTERECTOMY  11/13/2006    Allergies  Allergen Reactions  . Hydrocodone Other (See Comments)    Nausea, feels hot and insomia  . Sulfa Antibiotics Hives    Current Outpatient Medications on File Prior to Visit  Medication Sig Dispense Refill  . amLODipine (NORVASC) 10 MG tablet Take 10 mg by mouth every morning.     . DULoxetine (CYMBALTA) 20 MG capsule Take 20 mg by mouth daily.    Marland Kitchen LYSINE PO Take 1 capsule by mouth daily.     . Multiple Vitamins-Minerals (CENTRUM ADULTS) TABS Take 1 tablet by mouth daily.     . traMADol (ULTRAM) 50 MG tablet Take 50 mg by mouth every 6 (six) hours as needed for moderate pain.    . valACYclovir (VALTREX) 1000 MG tablet Take 1,000 mg by mouth daily.  5  . nystatin (MYCOSTATIN) 100000 UNIT/ML suspension Take 5 mLs (500,000 Units total) by mouth 4 (four) times daily. (Patient not taking: Reported on 09/18/2017)  240 mL 0  . pantoprazole (PROTONIX) 40 MG tablet TAKE ONE TABLET BY MOUTH TWICE DAILY. (Patient taking differently: Take 40 mg by mouth twice daily) 60 tablet 3  . potassium chloride (K-DUR,KLOR-CON) 10  MEQ tablet Take 10 mEq by mouth every morning.     No current facility-administered medications on file prior to visit.         Objective:   Physical Exam Blood pressure 108/72, pulse 80, temperature 98 F (36.7 C), height 5\' 4"  (1.626 m), weight 103 lb 1.6 oz (46.8 kg). Alert and oriented. Skin warm and dry. Oral mucosa is moist.   . Sclera anicteric, conjunctivae is pink. Thyroid not enlarged. No cervical lymphadenopathy. Lungs clear. Heart regular rate and rhythm.  Abdomen is soft. Bowel sounds are positive. No hepatomegaly. No abdominal masses felt. No tenderness.  No edema to lower extremities. Patient is alert and oriented.        Assessment & Plan:  PUD. Has continued to take Goody powders up until 2 weeks ago for back pain Scheduled for an EGD in January.  Rx for Carafate sent to her pharmacy.

## 2017-09-20 ENCOUNTER — Encounter (INDEPENDENT_AMBULATORY_CARE_PROVIDER_SITE_OTHER): Payer: Self-pay | Admitting: Internal Medicine

## 2017-09-24 ENCOUNTER — Telehealth (INDEPENDENT_AMBULATORY_CARE_PROVIDER_SITE_OTHER): Payer: Self-pay | Admitting: Internal Medicine

## 2017-09-24 NOTE — Telephone Encounter (Signed)
[name], This patient wants her procedure done earlier. She sent me a message thru my chart.

## 2017-09-24 NOTE — Telephone Encounter (Signed)
Sent message to patient, if I get a cancellation I will give her a call

## 2017-10-07 ENCOUNTER — Encounter (INDEPENDENT_AMBULATORY_CARE_PROVIDER_SITE_OTHER): Payer: Self-pay | Admitting: Internal Medicine

## 2017-10-08 ENCOUNTER — Encounter (INDEPENDENT_AMBULATORY_CARE_PROVIDER_SITE_OTHER): Payer: Self-pay | Admitting: Internal Medicine

## 2017-10-09 ENCOUNTER — Telehealth (INDEPENDENT_AMBULATORY_CARE_PROVIDER_SITE_OTHER): Payer: Self-pay | Admitting: Internal Medicine

## 2017-10-09 DIAGNOSIS — K219 Gastro-esophageal reflux disease without esophagitis: Secondary | ICD-10-CM

## 2017-10-09 MED ORDER — PANTOPRAZOLE SODIUM 40 MG PO TBEC: 40.0000 mg | DELAYED_RELEASE_TABLET | Freq: Two times a day (BID) | ORAL | 3 refills | Status: DC

## 2017-10-09 NOTE — Telephone Encounter (Signed)
Rx for Protonix sent to her pharmacy. 

## 2017-10-10 ENCOUNTER — Telehealth: Payer: Self-pay | Admitting: Neurology

## 2017-10-10 NOTE — Telephone Encounter (Signed)
-----   Message from [place], Generic sent at 10/10/2017 2:03 AM EST -----    Appointment Request From: [name]    With Provider: Marcial Pacas, MD [[facility]]    Preferred Date Range: 11/20/2017 - 11/20/2017    Preferred Times: Any time    Reason for visit: Request an Appointment    Comments:  Yes I made an appointment on February 28 th  At 8am.im have to cancel that appointment.i have to find a new family doctor to get referral from.i don't see Belenda Cruise at primary care anymore.thank u Franceen Wollam

## 2017-10-14 ENCOUNTER — Encounter (INDEPENDENT_AMBULATORY_CARE_PROVIDER_SITE_OTHER): Payer: Self-pay | Admitting: Internal Medicine

## 2017-10-16 ENCOUNTER — Encounter (HOSPITAL_COMMUNITY): Payer: Self-pay

## 2017-10-16 ENCOUNTER — Encounter (HOSPITAL_COMMUNITY)
Admission: RE | Disposition: A | Discharge status: Home / Self Care | Payer: Self-pay | Source: Ambulatory Visit | Attending: Internal Medicine

## 2017-10-16 ENCOUNTER — Ambulatory Visit (HOSPITAL_COMMUNITY)
Admission: RE | Admit: 2017-10-16 | Discharge: 2017-10-16 | Disposition: A | Discharge status: Home / Self Care | Payer: Medicaid Other | Source: Ambulatory Visit | Attending: Internal Medicine | Admitting: Internal Medicine

## 2017-10-16 ENCOUNTER — Encounter (INDEPENDENT_AMBULATORY_CARE_PROVIDER_SITE_OTHER): Payer: Self-pay | Admitting: Internal Medicine

## 2017-10-16 DIAGNOSIS — I10 Essential (primary) hypertension: Secondary | ICD-10-CM | POA: Diagnosis not present

## 2017-10-16 DIAGNOSIS — M549 Dorsalgia, unspecified: Secondary | ICD-10-CM | POA: Diagnosis not present

## 2017-10-16 DIAGNOSIS — G8929 Other chronic pain: Secondary | ICD-10-CM | POA: Diagnosis not present

## 2017-10-16 DIAGNOSIS — K449 Diaphragmatic hernia without obstruction or gangrene: Secondary | ICD-10-CM | POA: Insufficient documentation

## 2017-10-16 DIAGNOSIS — F419 Anxiety disorder, unspecified: Secondary | ICD-10-CM | POA: Diagnosis not present

## 2017-10-16 DIAGNOSIS — K257 Chronic gastric ulcer without hemorrhage or perforation: Secondary | ICD-10-CM | POA: Diagnosis not present

## 2017-10-16 DIAGNOSIS — K2950 Unspecified chronic gastritis without bleeding: Secondary | ICD-10-CM | POA: Insufficient documentation

## 2017-10-16 DIAGNOSIS — K210 Gastro-esophageal reflux disease with esophagitis: Secondary | ICD-10-CM | POA: Insufficient documentation

## 2017-10-16 DIAGNOSIS — Z79899 Other long term (current) drug therapy: Secondary | ICD-10-CM | POA: Diagnosis not present

## 2017-10-16 DIAGNOSIS — K315 Obstruction of duodenum: Secondary | ICD-10-CM

## 2017-10-16 DIAGNOSIS — Z882 Allergy status to sulfonamides status: Secondary | ICD-10-CM | POA: Diagnosis not present

## 2017-10-16 DIAGNOSIS — B9681 Helicobacter pylori [H. pylori] as the cause of diseases classified elsewhere: Secondary | ICD-10-CM | POA: Insufficient documentation

## 2017-10-16 DIAGNOSIS — K3189 Other diseases of stomach and duodenum: Secondary | ICD-10-CM | POA: Insufficient documentation

## 2017-10-16 DIAGNOSIS — K267 Chronic duodenal ulcer without hemorrhage or perforation: Secondary | ICD-10-CM

## 2017-10-16 DIAGNOSIS — Z885 Allergy status to narcotic agent status: Secondary | ICD-10-CM | POA: Insufficient documentation

## 2017-10-16 DIAGNOSIS — F17210 Nicotine dependence, cigarettes, uncomplicated: Secondary | ICD-10-CM | POA: Diagnosis not present

## 2017-10-16 DIAGNOSIS — M797 Fibromyalgia: Secondary | ICD-10-CM | POA: Insufficient documentation

## 2017-10-16 DIAGNOSIS — K589 Irritable bowel syndrome without diarrhea: Secondary | ICD-10-CM | POA: Diagnosis not present

## 2017-10-16 DIAGNOSIS — K259 Gastric ulcer, unspecified as acute or chronic, without hemorrhage or perforation: Secondary | ICD-10-CM | POA: Diagnosis not present

## 2017-10-16 DIAGNOSIS — K269 Duodenal ulcer, unspecified as acute or chronic, without hemorrhage or perforation: Secondary | ICD-10-CM

## 2017-10-16 DIAGNOSIS — Z8719 Personal history of other diseases of the digestive system: Secondary | ICD-10-CM | POA: Insufficient documentation

## 2017-10-16 DIAGNOSIS — R1013 Epigastric pain: Secondary | ICD-10-CM | POA: Diagnosis present

## 2017-10-16 DIAGNOSIS — F329 Major depressive disorder, single episode, unspecified: Secondary | ICD-10-CM | POA: Insufficient documentation

## 2017-10-16 HISTORY — PX: ESOPHAGOGASTRODUODENOSCOPY: SHX5428

## 2017-10-16 SURGERY — EGD (ESOPHAGOGASTRODUODENOSCOPY)
Anesthesia: Moderate Sedation

## 2017-10-16 MED ORDER — TRAMADOL HCL 50 MG PO TABS: 50.0000 mg | ORAL_TABLET | Freq: Three times a day (TID) | ORAL | 0 refills | Status: DC | PRN

## 2017-10-16 MED ORDER — MEPERIDINE HCL 50 MG/ML IJ SOLN
INTRAMUSCULAR | Status: AC
  Filled 2017-10-16: qty 1

## 2017-10-16 MED ORDER — MIDAZOLAM HCL 5 MG/5ML IJ SOLN
INTRAMUSCULAR | Status: DC | PRN
  Administered 2017-10-16 (×5): 2 mg via INTRAVENOUS

## 2017-10-16 MED ORDER — ESOMEPRAZOLE MAGNESIUM 40 MG PO PACK: 40.0000 mg | PACK | Freq: Every day | ORAL | 3 refills | Status: DC

## 2017-10-16 MED ORDER — MIDAZOLAM HCL 5 MG/5ML IJ SOLN
INTRAMUSCULAR | Status: AC
  Filled 2017-10-16: qty 10

## 2017-10-16 MED ORDER — STERILE WATER FOR IRRIGATION IR SOLN
Status: DC | PRN
  Administered 2017-10-16: 15 mL

## 2017-10-16 MED ORDER — LIDOCAINE VISCOUS 2 % MT SOLN
OROMUCOSAL | Status: AC
  Filled 2017-10-16: qty 15

## 2017-10-16 MED ORDER — MEPERIDINE HCL 50 MG/ML IJ SOLN
INTRAMUSCULAR | Status: DC | PRN
  Administered 2017-10-16 (×2): 25 mg via INTRAVENOUS

## 2017-10-16 MED ORDER — SODIUM CHLORIDE 0.9 % IV SOLN
INTRAVENOUS | Status: DC
  Administered 2017-10-16: 1000 mL via INTRAVENOUS

## 2017-10-16 NOTE — Op Note (Addendum)
[facility] Patient Name: [name] Procedure Date: 10/16/2017 9:53 AM MRN: [id number] Date of Birth: May 03, 1966 Attending MD: Hildred Laser , MD CSN: [id number] Age: 52 Admit Type: Outpatient Procedure:                Upper GI endoscopy Indications:              Epigastric abdominal pain, Follow-up of chronic                            gastric ulcer, Follow-up of chronic duodenal ulcer,                            For therapy of chronic duodenal ulcer Providers:                Hildred Laser, MD, Charlsie Quest. Theda Sers RN, RN, Nelma Rothman, Technician Referring MD:             Glenda Chroman, MD Medicines:                Lidocaine spray, Meperidine 50 mg IV, Midazolam 10                            mg IV Complications:            No immediate complications. Estimated Blood Loss:     Estimated blood loss was minimal. Procedure:                Pre-Anesthesia Assessment:                           - Prior to the procedure, a History and Physical                            was performed, and patient medications and                            allergies were reviewed. The patient's tolerance of                            previous anesthesia was also reviewed. The risks                            and benefits of the procedure and the sedation                            options and risks were discussed with the patient.                            All questions were answered, and informed consent                            was obtained. Prior Anticoagulants: The patient has  taken no previous anticoagulant or antiplatelet                            agents. ASA Grade Assessment: II - A patient with                            mild systemic disease. After reviewing the risks                            and benefits, the patient was deemed in                            satisfactory condition to undergo the procedure.                           After  obtaining informed consent, the endoscope was                            passed under direct vision. Throughout the                            procedure, the patient's blood pressure, pulse, and                            oxygen saturations were monitored continuously. The                            EG-299Ol ([id number]) scope was introduced through the                            mouth, and advanced to the second part of duodenum.                            The upper GI endoscopy was accomplished without                            difficulty. The patient tolerated the procedure                            well. Scope In: 10:21:25 AM Scope Out: 10:33:18 AM Total Procedure Duration: 0 hours 11 minutes 53 seconds  Findings:      The proximal esophagus and mid esophagus were normal.      LA Grade B (one or more mucosal breaks greater than 5 mm, not extending       between the tops of two mucosal folds) esophagitis was found 36 to 38 cm       from the incisors.      The Z-line was regular and was found 38 cm from the incisors.      A 2 cm hiatal hernia was present.      A small amount of food (residue) was found in the gastric body.      One non-bleeding cratered gastric ulcer with no stigmata of bleeding was       found in the gastric antrum. The lesion was five mm  by ten mm in largest       dimension. Biopsies were taken with a cold forceps for histology. The       pathology specimen was placed into Bottle Number 1.      Diffuse mild inflammation characterized by congestion (edema) and       erythema was found in the gastric antrum and in the prepyloric region of       the stomach.      The exam of the stomach was otherwise normal.      One non-bleeding duodenal ulcer with no stigmata of bleeding was found       in the duodenal bulb. The lesion was large cicumferential ulcer mm in       largest dimension.      An acquired benign-appearing, intrinsic moderate stenosis was found in       the  duodenal bulb and was traversed after dilation. A TTS dilator was       passed through the scope. Dilation with a 12-13.5-15 mm pyloric balloon       dilator was performed. Stricture dilated to 12 mm. The dilation site was       examined and showed moderate improvement in luminal narrowing and no       perforation.      The second portion of the duodenum was normal. Impression:               - Normal proximal esophagus and mid esophagus.                           - LA Grade B reflux esophagitis.                           - Z-line regular, 38 cm from the incisors.                           - 2 cm hiatal hernia.                           - A small amount of food (residue) in the stomach.                           - Non-bleeding gastric ulcer with no stigmata of                            bleeding. Biopsied.                           - Gastritis.                           - One non-bleeding duodenal ulcer with no stigmata                            of bleeding.                           - Acquired duodenal stenosis. Dilated.                           -  Normal second portion of the duodenum.                           Comment: Nonhealing ulcers. ? continued NSAID use. Moderate Sedation:      Moderate (conscious) sedation was administered by the endoscopy nurse       and supervised by the endoscopist. The following parameters were       monitored: oxygen saturation, heart rate, blood pressure, CO2       capnography and response to care. Total physician intraservice time was       21 minutes. Recommendation:           - Patient has a contact number available for                            emergencies. The signs and symptoms of potential                            delayed complications were discussed with the                            patient. Return to normal activities tomorrow.                            Written discharge instructions were provided to the                             patient.                           - Mechanical soft diet today.                           - No aspirin, ibuprofen, naproxen, or other                            non-steroidal anti-inflammatory drugs.                           - Continue present medications but stop                            Pantoprazole.                           - Esomeprazole 40 mg po bid.                           - Await pathology results.                           - Fasting Gastrin level.                           - tramadol 50 mg po tid prn; 42 doses without                            refill.                           -  Procedure Code(s):        --- Professional ---                           [phone], Esophagogastroduodenoscopy, flexible,                            transoral; with dilation of gastric/duodenal                            stricture(s) (eg, balloon, bougie)                           43239, Esophagogastroduodenoscopy, flexible,                            transoral; with biopsy, single or multiple                           99152, Moderate sedation services provided by the                            same physician or other qualified health care                            professional performing the diagnostic or                            therapeutic service that the sedation supports,                            requiring the presence of an independent trained                            observer to assist in the monitoring of the                            patient's level of consciousness and physiological                            status; initial 15 minutes of intraservice time,                            patient age 68 years or older Diagnosis Code(s):        --- Professional ---                           K21.0, Gastro-esophageal reflux disease with                            esophagitis                           K44.9, Diaphragmatic hernia without obstruction or                            gangrene  K25.9, Gastric ulcer, unspecified as acute or                            chronic, without hemorrhage or perforation                           K29.70, Gastritis, unspecified, without bleeding                           K26.9, Duodenal ulcer, unspecified as acute or                            chronic, without hemorrhage or perforation                           K31.5, Obstruction of duodenum                           R10.13, Epigastric pain                           K25.7, Chronic gastric ulcer without hemorrhage or                            perforation                           K26.7, Chronic duodenal ulcer without hemorrhage or                            perforation CPT copyright 2016 [employer]. All rights reserved. The codes documented in this report are preliminary and upon coder review may  be revised to meet current compliance requirements. Hildred Laser, MD Hildred Laser, MD 10/16/2017 10:51:26 AM This report has been signed electronically. Number of Addenda: 0

## 2017-10-16 NOTE — H&P (Signed)
[name] is an 52 y.o. female.   Chief Complaint: Patient is here for EGD.  HPI: Patient is 52 year old Caucasian female who has a history of peptic ulcer disease.  She underwent EGD about 3 months ago revealing gastric as well as duodenal ulcer.  She had noncritical narrowing past duodenal ulcer.  She has been maintained on double dose PPI.  She has stopped using NSAIDs.  She remains with epigastric pain.  She gets some relief with meal or when she drinks milk.  She has not experienced nausea vomiting melena or rectal bleeding. She has fibromyalgia and chronic back pain.  She is now seeing Dr. Woody Seller since Dr. Wenda Overland passed away. She does not drink alcohol.  She is smoking a pack of cigarettes per day.  She has dropped and trying her best to quit.  Past Medical History:  Diagnosis Date  . Anxiety   . Depression   . Essential hypertension, benign 12/26/2016  . Fibroadenoma of right breast    S/P LUMPECTOMY W/ RADIOACTIVE SEED IMPLANT 05-19-2015  . Fibromyalgia   . GERD (gastroesophageal reflux disease)   . History of cervical dysplasia    s/p conzation and recurrent s/p  vaginal hysterectomy 2008  . History of duodenal ulcer    1992--  S/P  REPAIR PERFORATED ULCER  . History of gastric ulcer    age 83  . Hypertension   . IBS (irritable bowel syndrome)   . VIN II (vulvar intraepithelial neoplasia II)   . Wears glasses   . Wears partial dentures    lower    Past Surgical History:  Procedure Laterality Date  . BLADDER SLING PROCEDURE  2005  approx.  . CERVICAL CONIZATION W/BX  2006  approx  . CO2 LASER APPLICATION N/A 9/37/3428   Procedure: CO2 LASER OF THE VAGINA;  Surgeon: Everitt Amber, MD;  Location: [facility];  Service: Gynecology;  Laterality: N/A;  . ESOPHAGOGASTRODUODENOSCOPY N/A 01/21/2017   Procedure: ESOPHAGOGASTRODUODENOSCOPY (EGD);  Surgeon: Rogene Houston, MD;  Location: [facility];  Service: Endoscopy;  Laterality: N/A;  . ESOPHAGOGASTRODUODENOSCOPY  N/A 07/17/2017   Procedure: ESOPHAGOGASTRODUODENOSCOPY (EGD);  Surgeon: Rogene Houston, MD;  Location: [facility];  Service: Endoscopy;  Laterality: N/A;  1:00  . ESOPHAGOGASTRODUODENOSCOPY (EGD) WITH PROPOFOL N/A 01/23/2017   Procedure: ESOPHAGOGASTRODUODENOSCOPY (EGD) WITH PROPOFOL;  Surgeon: Rogene Houston, MD;  Location: [facility];  Service: Endoscopy;  Laterality: N/A;  duodenal striture dilation  . RADIOACTIVE SEED GUIDED EXCISIONAL BREAST BIOPSY Right 05/19/2015   Procedure: EXCISIONAL RIGHT BREAST MASS WITH RADIOACTIVE SEED LOCALIZATION;  Surgeon: Fanny Skates, MD;  Location: [place];  Service: General;  Laterality: Right;  . REPAIR OF PERFORATED ULCER  1992   duodenal  . VAGINAL HYSTERECTOMY  11/13/2006    Family History  Problem Relation Age of Onset  . Hypertension Mother   . Clotting disorder Father   . Diabetes Father   . Hypertension Father   . Hypertension Sister   . Hypertension Brother   . Hypertension Maternal Aunt   . Hypertension Maternal Uncle   . Hypertension Paternal Aunt   . Hypertension Paternal Uncle    Social History:  reports that she has been smoking cigarettes.  She has a 18.00 pack-year smoking history. she has never used smokeless tobacco. She reports that she does not drink alcohol or use drugs.  Allergies:  Allergies  Allergen Reactions  . Hydrocodone Other (See Comments)    Nausea, feels hot  and insomia  . Sulfa Antibiotics Hives    Medications Prior to Admission  Medication Sig Dispense Refill  . amLODipine (NORVASC) 10 MG tablet Take 10 mg by mouth every morning.     . Multiple Vitamins-Minerals (CENTRUM ADULTS) TABS Take 1 tablet by mouth daily.     . pantoprazole (PROTONIX) 40 MG tablet Take 1 tablet (40 mg total) by mouth 2 (two) times daily. 60 tablet 3  . potassium chloride (K-DUR,KLOR-CON) 10 MEQ tablet Take 10 mEq by mouth every morning.    . traMADol (ULTRAM) 50 MG tablet Take 50 mg by mouth every 6 (six)  hours as needed for moderate pain.    . valACYclovir (VALTREX) 1000 MG tablet Take 1,000 mg by mouth daily.  5  . vitamin B-12 (CYANOCOBALAMIN) 1000 MCG tablet Take 1,000 mcg by mouth daily.      No results found for this or any previous visit (from the past 48 hour(s)). No results found.  ROS  Blood pressure 121/77, pulse 83, temperature 98.1 F (36.7 C), temperature source Oral, resp. rate 18, height 5\' 4"  (1.626 m), weight 104 lb (47.2 kg), SpO2 100 %. Physical Exam  Constitutional:  Well-developed thin Caucasian female in NAD.  HENT:  Mouth/Throat: Oropharynx is clear and moist.  Eyes: Conjunctivae are normal. No scleral icterus.  Neck: No thyromegaly present.  Cardiovascular: Normal rate, regular rhythm and normal heart sounds.  No murmur heard. Respiratory: Effort normal and breath sounds normal.  GI:  Abdomen is flat.  It soft with mild midepigastric tenderness.  No organomegaly or masses.  Musculoskeletal: She exhibits no edema.  Lymphadenopathy:    She has no cervical adenopathy.  Neurological: She is alert.  Skin: Skin is warm and dry.     Assessment/Plan History of gastric and duodenal ulcers. Persistent symptoms. Diagnostic EGD.  Hildred Laser, MD 10/16/2017, 10:07 AM

## 2017-10-16 NOTE — Discharge Instructions (Signed)
Discontinue pantoprazole. Begin Esomeprazole 40 mg by mouth 30 minutes before breakfast and evening meal daily. Tramadol 50 mg by mouth up to 3 times a day as needed.  Future prescriptions to be given by Dr. Woody Seller. Mechanical soft diet. No driving for 24 hours. Physician will call with results of biopsy and blood test. Office visit in 4 weeks.   Esophagogastroduodenoscopy, Care After Refer to this sheet in the next few weeks. These instructions provide you with information about caring for yourself after your procedure. Your health care provider may also give you more specific instructions. Your treatment has been planned according to current medical practices, but problems sometimes occur. Call your health care provider if you have any problems or questions after your procedure. What can I expect after the procedure? After the procedure, it is common to have:  A sore throat.  Nausea.  Bloating.  Dizziness.  Fatigue.  Follow these instructions at home:  Do not eat or drink anything until the numbing medicine (local anesthetic) has worn off and your gag reflex has returned. You will know that the local anesthetic has worn off when you can swallow comfortably.  Do not drive for 24 hours if you received a medicine to help you relax (sedative).  If your health care provider took a tissue sample for testing during the procedure, make sure to get your test results. This is your responsibility. Ask your health care provider or the department performing the test when your results will be ready.  Keep all follow-up visits as told by your health care provider. This is important. Contact a health care provider if:  You cannot stop coughing.  You are not urinating.  You are urinating less than usual. Get help right away if:  You have trouble swallowing.  You cannot eat or drink.  You have throat or chest pain that gets worse.  You are dizzy or light-headed.  You faint.  You  have nausea or vomiting.  You have chills.  You have a fever.  You have severe abdominal pain.  You have black, tarry, or bloody stools. This information is not intended to replace advice given to you by your health care provider. Make sure you discuss any questions you have with your health care provider. Document Released: 08/26/2012 Document Revised: 02/15/2016 Document Reviewed: 08/03/2015 Elsevier Interactive Patient Education  2018 [place] Meal Plan A soft-food meal plan includes foods that are safe and easy to swallow. This meal plan typically is used:  If you are having trouble chewing or swallowing foods.  As a transition meal plan after only having had liquid meals for a long period.  What do I need to know about the soft-food meal plan? A soft-food meal plan includes tender foods that are soft and easy to chew and swallow. In most cases, bite-sized pieces of food are easier to swallow. A bite-sized piece is about  inch or smaller. Foods in this plan do not need to be ground or pureed. Foods that are very hard, crunchy, or sticky should be avoided. Also, breads, cereals, yogurts, and desserts with nuts, seeds, or fruits should be avoided. What foods can I eat? Grains Rice and wild rice. Moist bread, dressing, pasta, and noodles. Well-moistened dry or cooked cereals, such as farina (cooked wheat cereal), oatmeal, or grits. Biscuits, breads, muffins, pancakes, and waffles that have been well moistened. Vegetables Shredded lettuce. Cooked, tender vegetables, including potatoes without skins. Vegetable juices. Broths or creamed soups made with  vegetables that are not stringy or chewy. Strained tomatoes (without seeds). Fruits Canned or well-cooked fruits. Soft (ripe), peeled fresh fruits, such as peaches, nectarines, kiwi, cantaloupe, honeydew melon, and watermelon (without seeds). Soft berries with small seeds, such as strawberries. Fruit juices (without  pulp). Meats and Other Protein Sources Moist, tender, lean beef. Mutton. Lamb. Veal. Chicken. [place]. Liver. Ham. Fish without bones. Eggs. Dairy Milk, milk drinks, and cream. Plain cream cheese and cottage cheese. Plain yogurt. Sweets/Desserts Flavored gelatin desserts. Custard. Plain ice cream, frozen yogurt, sherbet, milk shakes, and malts. Plain cakes and cookies. Plain hard candy. Other Butter, margarine (without trans fat), and cooking oils. Mayonnaise. Cream sauces. Mild spices, salt, and sugar. Syrup, molasses, honey, and jelly. The items listed above may not be a complete list of recommended foods or beverages. Contact your dietitian for more options. What foods are not recommended? Grains Dry bread, toast, crackers that have not been moistened. Coarse or dry cereals, such as bran, granola, and shredded wheat. Tough or chewy crusty breads, such as [place] bread or baguettes. Vegetables Corn. Raw vegetables except shredded lettuce. Cooked vegetables that are tough or stringy. Tough, crisp, fried potatoes and potato skins. Fruits Fresh fruits with skins or seeds or both, such as apples, pears, or grapes. Stringy, high-pulp fruits, such as papaya, pineapple, coconut, or mango. Fruit leather, fruit roll-ups, and all dried fruits. Meats and Other Protein Sources Sausages and hot dogs. Meats with gristle. Fish with bones. Nuts, seeds, and chunky peanut or other nut butters. Sweets/Desserts Cakes or cookies that are very dry or chewy. The items listed above may not be a complete list of foods and beverages to avoid. Contact your dietitian for more information. This information is not intended to replace advice given to you by your health care provider. Make sure you discuss any questions you have with your health care provider. Document Released: 12/17/2007 Document Revised: 02/15/2016 Document Reviewed: 08/06/2013 Elsevier Interactive Patient Education  2017 [place].    Esomeprazole capsules What is this medicine? ESOMEPRAZOLE (es oh ME pray zol) prevents the production of acid in the stomach. It is used to treat gastroesophageal reflux disease (GERD), ulcers, certain bacteria in the stomach, and inflammation of the esophagus. It can also be used to prevent ulcers in patients taking medicines called NSAIDs. You can also buy this medicine without a prescription to treat the symptoms of heartburn. The non-prescription product is not for long-term use, unless otherwise directed by your doctor or health care professional. This medicine may be used for other purposes; ask your health care provider or pharmacist if you have questions. COMMON BRAND NAME(S): Nexium, Nexium 24HR What should I tell my health care provider before I take this medicine? They need to know if you have any of these conditions: -bloody or black, tarry stools -chest pain -have had heartburn for over 3 months -have heartburn with dizziness, lightheadedness, or sweating -liver disease -low levels of magnesium in the blood -lupus -nausea, vomiting -stomach pain -trouble swallowing -unexplained weight loss -vomiting with blood -wheezing -an unusual or allergic reaction to esomeprazole, other medicines, foods, dyes, or preservatives -pregnant or trying to get pregnant -breast-feeding How should I use this medicine? Take this medicine by mouth. Swallow the capsules whole with a drink of water. Follow the directions on the prescription or product label. Do not crush, break or chew. The capsules can be opened and the contents sprinkled on applesauce. Do not crush the contents into the food. This medicine works best  if taken on an empty stomach at least one hour before a meal. Take your medicine at regular intervals. Do not take your medicine more often than directed. Talk to your pediatrician regarding the use of this medicine in children. Special care may be needed. Overdosage: If you  think you have taken too much of this medicine contact a poison control center or emergency room at once. NOTE: This medicine is only for you. Do not share this medicine with others. What if I miss a dose? If you miss a dose, take it as soon as you can. If it is almost time for your next dose, take only that dose. Do not take double or extra doses. What may interact with this medicine? Do not take this medicine with any of the following medications: -atazanavir This medicine may also interact with the following medications: -ampicillin -antiviral medicines for HIV or AIDS -certain medicines for fungal infections like ketoconazole and itraconazole -cilostazol -clopidogrel -diazepam -digoxin -erlotinib -diuretics -iron salts -methotrexate -St. John's Wort -tacrolimus -rifampin -warfarin This list may not describe all possible interactions. Give your health care provider a list of all the medicines, herbs, non-prescription drugs, or dietary supplements you use. Also tell them if you smoke, drink alcohol, or use illegal drugs. Some items may interact with your medicine. What should I watch for while using this medicine? If you are taking this medicine without a prescription, it may take 1 to 4 days for it to fully relieve your heartburn. If you are using this medicine with a prescription from your healthcare professional for a more serious condition, it can take several days before your condition gets better. Check with your doctor or health care professional if your condition does not start to get better, or if it gets worse. If you take this medicine for long periods of time, you may need blood work done. What side effects may I notice from receiving this medicine? Side effects that you should report to your doctor or health care professional as soon as possible: -allergic reactions like skin rash, itching or hives, swelling of the face, lips, or tongue -bone, muscle or joint  pain -breathing problems -chest pain or chest tightness -dark yellow or brown urine -fast, irregular heartbeat -feeling faint or lightheaded -fever or sore throat -muscle spasms -rash on cheeks or arms that gets worse in the sun -tremors -unusual bleeding or bruising -unusually weak or tired -upset stomach -yellowing of the eyes or skin Side effects that usually do not require medical attention (report to your doctor or health care professional if they continue or are bothersome): -constipation -diarrhea -dry mouth -headache -nausea -stomach pain or gas -vomiting This list may not describe all possible side effects. Call your doctor for medical advice about side effects. You may report side effects to FDA at 1-800-FDA-1088. Where should I keep my medicine? Keep out of the reach of children. Store at room temperature between 15 and 30 degrees C (59 and 86 degrees F). Protect from light and moisture. Throw away any unused medicine after the expiration date. NOTE: This sheet is a summary. It may not cover all possible information. If you have questions about this medicine, talk to your doctor, pharmacist, or health care provider.  2018 Elsevier/Gold Standard (2015-10-12 12:52:41)

## 2017-10-18 LAB — GASTRIN: Gastrin: 114 pg/mL (ref 0–115)

## 2017-10-19 ENCOUNTER — Encounter (INDEPENDENT_AMBULATORY_CARE_PROVIDER_SITE_OTHER): Payer: Self-pay | Admitting: Internal Medicine

## 2017-10-20 ENCOUNTER — Encounter (HOSPITAL_COMMUNITY): Payer: Self-pay | Admitting: Internal Medicine

## 2017-10-26 ENCOUNTER — Encounter: Payer: Self-pay | Admitting: General Surgery

## 2017-10-30 ENCOUNTER — Ambulatory Visit (INDEPENDENT_AMBULATORY_CARE_PROVIDER_SITE_OTHER): Payer: Medicaid Other | Admitting: General Surgery

## 2017-10-30 ENCOUNTER — Encounter: Payer: Self-pay | Admitting: General Surgery

## 2017-10-30 ENCOUNTER — Encounter (INDEPENDENT_AMBULATORY_CARE_PROVIDER_SITE_OTHER): Payer: Self-pay | Admitting: Internal Medicine

## 2017-10-30 VITALS — BP 95/69 | HR 93 | Temp 98.6°F | Resp 18 | Ht 64.0 in | Wt 104.0 lb

## 2017-10-30 DIAGNOSIS — R634 Abnormal weight loss: Secondary | ICD-10-CM | POA: Diagnosis not present

## 2017-10-30 DIAGNOSIS — K269 Duodenal ulcer, unspecified as acute or chronic, without hemorrhage or perforation: Secondary | ICD-10-CM | POA: Diagnosis not present

## 2017-10-30 DIAGNOSIS — K257 Chronic gastric ulcer without hemorrhage or perforation: Secondary | ICD-10-CM

## 2017-10-30 DIAGNOSIS — R1013 Epigastric pain: Secondary | ICD-10-CM

## 2017-10-30 NOTE — Patient Instructions (Signed)
Quit smoking. Do not use ibuprofen, aleve, goodies, BC powders or any NSAIDs. Get your blood test and stool test. If you can come off of her esomeprazole for 1-2 weeks prior this will aid in making the test more accurate.  CT a/p ordered to assess for other findings (tumors) that could explain high acid levels   Gastrin Test The gastrin test is a blood test used to check the level of gastrin in your blood. Gastrin is a hormone that is made in your stomach by cells called G cells. Having the right amount of gastrin is important to aid in food digestion. However, if your body makes too much gastrin, you are at risk for peptic ulcer disease. This is a condition in which you develop repeated peptic, or stomach, ulcers over time. Certain conditions can cause excess gastrin production, including tumors of the pancreas (Zollinger-Ellison syndrome) and overgrowth of G cells (G cell hyperplasia). Your health care provider may recommend that you have the gastrin test to help determine the cause of peptic ulcer disease. How do I prepare for this test? Do not eat or drink anything other than water for 12 hours before the test or as directed by your health care provider. Avoid alcohol for at least 24 hours before the test. What do the results mean? It is your responsibility to obtain your test results. Ask the lab or department performing the test when and how you will get your results. Contact your health care provider to discuss any questions you have about your results. Range of Normal Values Ranges for normal values may vary among different labs and hospitals. You should always check with your health care provider after having lab work or other tests done to discuss whether your values are considered within normal limits. Adult: 0-180 pg/mL or 0-180 ng/L (SI units). Child: 0-125 pg/mL. Levels may be higher in elderly people and people with diabetes who are experiencing low blood sugar (hypoglycemia) at the  time of the test. Some medicines and substances can affect your gastrin levels. Those that can make your gastrin levels higher than normal may include:  Medicines that reduce stomach acid, such as: ? Antacids. ? H2 blockers.  Calcium supplements.  Catecholamines.  Caffeine.  Medicines that can make your gastrin levels lower than normal may include:  Anticholinergics.  Tricyclic antidepressants.  Meaning of Results Outside Normal Value Ranges A gastrin level that is too high can indicate a number of health problems. These may include:  Zollinger-Ellison syndrome.  G cell hyperplasia.  Pernicious anemia. This is when your body makes an abnormally low number of red blood cells due to a vitamin B12 deficiency.  Atrophic gastritis.  Stomach cancer.  Long-term (chronic) kidney failure.  Gastric outlet obstruction. This is when digested food does not properly pass from your stomach into your small intestine.  Complications after gastric surgery.  Discuss the results of this test with your health care provider. He or she will use the results of this test to make a diagnosis and determine a treatment plan that is right for you. Talk with your health care provider to discuss your results, treatment options, and if necessary, the need for more tests. Talk with your health care provider if you have any questions about your results. This information is not intended to replace advice given to you by your health care provider. Make sure you discuss any questions you have with your health care provider. Document Released: 10/04/2004 Document Revised: 05/14/2016 Document Reviewed: 01/14/2014  Elsevier Interactive Patient Education  [employer].   Peptic Ulcer A peptic ulcer is a painful sore in the lining of your esophagus, stomach, or the first part of your small intestine. You may have pain in the area between your chest and your belly button. The most common causes of an ulcer  are:  An infection.  Using certain pain medicines too often or too much.  Follow these instructions at home:  Avoid alcohol.  Avoid caffeine.  Do not use any tobacco products. These include cigarettes, chewing tobacco, and e-cigarettes. If you need help quitting, ask your doctor.  Take over-the-counter and prescription medicines only as told by your doctor. Do not stop or change your medicines unless you talk with your doctor about it first.  Keep all follow-up visits as told by your doctor. This is important. Contact a doctor if:  You do not get better in 7 days after you start treatment.  You keep having an upset stomach (indigestion) or heartburn. Get help right away if:  You have sudden, sharp pain in your belly (abdomen).  You have lasting belly pain.  You have bloody poop (stool) or black, tarry poop.  You throw up (vomit) blood. It may look like coffee grounds.  You feel light-headed or feel like you may pass out (faint).  You get weak.  You get sweaty or feel sticky and cold to the touch (clammy). This information is not intended to replace advice given to you by your health care provider. Make sure you discuss any questions you have with your health care provider. Document Released: 12/04/2009 Document Revised: 01/24/2016 Document Reviewed: 06/10/2015 Elsevier Interactive Patient Education  2018 [employer].   Steps to Quit Smoking Smoking tobacco can be bad for your health. It can also affect almost every organ in your body. Smoking puts you and people around you at risk for many serious long-lasting (chronic) diseases. Quitting smoking is hard, but it is one of the best things that you can do for your health. It is never too late to quit. What are the benefits of quitting smoking? When you quit smoking, you lower your risk for getting serious diseases and conditions. They can include:  Lung cancer or lung disease.  Heart disease.  Stroke.  Heart  attack.  Not being able to have children (infertility).  Weak bones (osteoporosis) and broken bones (fractures).  If you have coughing, wheezing, and shortness of breath, those symptoms may get better when you quit. You may also get sick less often. If you are pregnant, quitting smoking can help to lower your chances of having a baby of low birth weight. What can I do to help me quit smoking? Talk with your doctor about what can help you quit smoking. Some things you can do (strategies) include:  Quitting smoking totally, instead of slowly cutting back how much you smoke over a period of time.  Going to in-person counseling. You are more likely to quit if you go to many counseling sessions.  Using resources and support systems, such as: ? Database administrator with a Social worker. ? Phone quitlines. ? Careers information officer. ? Support groups or group counseling. ? Text messaging programs. ? Mobile phone apps or applications.  Taking medicines. Some of these medicines may have nicotine in them. If you are pregnant or breastfeeding, do not take any medicines to quit smoking unless your doctor says it is okay. Talk with your doctor about counseling or other things that can help  you.  Talk with your doctor about using more than one strategy at the same time, such as taking medicines while you are also going to in-person counseling. This can help make quitting easier. What things can I do to make it easier to quit? Quitting smoking might feel very hard at first, but there is a lot that you can do to make it easier. Take these steps:  Talk to your family and friends. Ask them to support and encourage you.  Call phone quitlines, reach out to support groups, or work with a Social worker.  Ask people who smoke to not smoke around you.  Avoid places that make you want (trigger) to smoke, such as: ? Bars. ? Parties. ? Smoke-break areas at work.  Spend time with people who do not smoke.  Lower the  stress in your life. Stress can make you want to smoke. Try these things to help your stress: ? Getting regular exercise. ? Deep-breathing exercises. ? Yoga. ? Meditating. ? Doing a body scan. To do this, close your eyes, focus on one area of your body at a time from head to toe, and notice which parts of your body are tense. Try to relax the muscles in those areas.  Download or buy apps on your mobile phone or tablet that can help you stick to your quit plan. There are many free apps, such as QuitGuide from the [employer] Office manager for Disease Control and Prevention). You can find more support from smokefree.gov and other websites.  This information is not intended to replace advice given to you by your health care provider. Make sure you discuss any questions you have with your health care provider. Document Released: 07/06/2009 Document Revised: 05/07/2016 Document Reviewed: 01/24/2015 Elsevier Interactive Patient Education  2018 [employer].

## 2017-10-30 NOTE — Progress Notes (Addendum)
[facility] History and Physical  Reason for Referral: Chronic non-healing peptic ulcers  Referring Physician: Dr. Laural Golden   Chief Complaint    Peptic Ulcer Disease      [name] is a 52 y.o. female.  HPI: Ms. [name] is a 52 yo female who presents to clinic today with a history of multiple ulcers throughout her life starting in the 12s when she presented in her 1s with severe abdomen pain and was initially thought to have something going on with her gallbladder but was found intraoperatively to have a perforated duodenal ulcer that has managed with drains and a patch based on her description. At that time she was not smoking or taking NSAIDs. She then reports having a history on and off of ulcers since that time, and does report that she smokes and takes a significant amount of NSAIDs, mostly Goody's powder for pain control of fibromyalgia.  She says that she started to have an issues again back in May 2018 when she was found to have non bleeding prepyloric and duodenal ulcers, and was rescoped October 2018 and again January 2019.  She continues to have the prepyloric and duodenal ulcer with the duodenal ulcer being larger and also having some degree of stricture which was dilated.  She reports to me that she continues smoking and taking Goody's powders within at least 1 week of her last EGD.  She still continues to smoke but has cut back and reports no Goody's powders.  Her biopsies have been negative for malignancy and H pylori was negative on the biopsies.  Between the 06/2017 and 09/2017 EGD she says she took a PPI BID, but now takes esomeprazole daily because the other was not helping.  Of note, she reports a significant family history of ulcer disease in her mother, brother and sister. Each of these people have had EGD diagnosed ulcers per her report, and they do not use Goody's and not all of them smoke.  As far as any MEN 1 related issues, she denies any family history of  thyroid problems, brain tumors, high calcium, lipomas, or any cancers or pancreatic tumors.    Past Medical History:  Diagnosis Date  . Anxiety   . Depression   . Essential hypertension, benign 12/26/2016  . Fibroadenoma of right breast    S/P LUMPECTOMY W/ RADIOACTIVE SEED IMPLANT 05-19-2015  . Fibromyalgia   . GERD (gastroesophageal reflux disease)   . History of cervical dysplasia    s/p conzation and recurrent s/p  vaginal hysterectomy 2008  . History of duodenal ulcer    1992--  S/P  REPAIR PERFORATED ULCER  . History of gastric ulcer    age 27  . Hypertension   . IBS (irritable bowel syndrome)   . VIN II (vulvar intraepithelial neoplasia II)   . Wears glasses   . Wears partial dentures    lower    Past Surgical History:  Procedure Laterality Date  . BLADDER SLING PROCEDURE  2005  approx.  . CERVICAL CONIZATION W/BX  2006  approx  . CO2 LASER APPLICATION N/A 09/24/5850   Procedure: CO2 LASER OF THE VAGINA;  Surgeon: Everitt Amber, MD;  Location: [facility];  Service: Gynecology;  Laterality: N/A;  . ESOPHAGOGASTRODUODENOSCOPY N/A 01/21/2017   Procedure: ESOPHAGOGASTRODUODENOSCOPY (EGD);  Surgeon: Rogene Houston, MD;  [facility];  Service: Endoscopy;  Laterality: N/A;  . ESOPHAGOGASTRODUODENOSCOPY N/A 07/17/2017   Procedure: ESOPHAGOGASTRODUODENOSCOPY (EGD);  Surgeon: Rogene Houston,  MD;  Location: [facility];  Service: Endoscopy;  Laterality: N/A;  1:00  . ESOPHAGOGASTRODUODENOSCOPY N/A 10/16/2017   Procedure: ESOPHAGOGASTRODUODENOSCOPY (EGD)WITH DILATION;  Surgeon: Rogene Houston, MD;  Location: [facility];  Service: Endoscopy;  Laterality: N/A;  1030  . ESOPHAGOGASTRODUODENOSCOPY (EGD) WITH PROPOFOL N/A 01/23/2017   Procedure: ESOPHAGOGASTRODUODENOSCOPY (EGD) WITH PROPOFOL;  Surgeon: Rogene Houston, MD;  [facility];  Service: Endoscopy;  Laterality: N/A;  duodenal striture dilation  . RADIOACTIVE SEED GUIDED EXCISIONAL BREAST  BIOPSY Right 05/19/2015   Procedure: EXCISIONAL RIGHT BREAST MASS WITH RADIOACTIVE SEED LOCALIZATION;  Surgeon: Fanny Skates, MD;  Location: [facility];  Service: General;  Laterality: Right;  . REPAIR OF PERFORATED ULCER  1992   duodenal  . VAGINAL HYSTERECTOMY  11/13/2006    Family History  Problem Relation Age of Onset  . Hypertension Mother   . Clotting disorder Father   . Diabetes Father   . Hypertension Father   . Hypertension Sister   . Hypertension Brother   . Hypertension Maternal Aunt   . Hypertension Maternal Uncle   . Hypertension Paternal Aunt   . Hypertension Paternal Uncle     Social History   Tobacco Use  . Smoking status: Current Every Day Smoker    Packs/day: 1.00    Years: 18.00    Pack years: 18.00    Types: Cigarettes  . Smokeless tobacco: Never Used  Substance Use Topics  . Alcohol use: No  . Drug use: No    Medications: I have reviewed the patient's current medications. Allergies as of 10/30/2017      Reactions   Hydrocodone Other (See Comments)   Nausea, feels hot and insomia   Sulfa Antibiotics Hives      Medication List        Accurate as of 10/30/17  4:02 PM. Always use your most recent med list.          amLODipine 10 MG tablet Commonly known as:  NORVASC Take 10 mg by mouth every morning.   CENTRUM ADULTS Tabs Take 1 tablet by mouth daily.   esomeprazole 40 MG packet Commonly known as:  NEXIUM Take 40 mg by mouth daily before breakfast.   potassium chloride 10 MEQ tablet Commonly known as:  K-DUR,KLOR-CON Take 10 mEq by mouth every morning.   traMADol 50 MG tablet Commonly known as:  ULTRAM Take 1 tablet (50 mg total) by mouth 3 (three) times daily as needed for moderate pain.   valACYclovir 1000 MG tablet Commonly known as:  VALTREX Take 1,000 mg by mouth daily.   vitamin B-12 1000 MCG tablet Commonly known as:  CYANOCOBALAMIN Take 1,000 mcg by mouth daily.        ROS:  A comprehensive review  of systems was negative except for: Constitutional: positive for night sweats Respiratory: positive for wheezing and cough Cardiovascular: positive for HTN Gastrointestinal: positive for abdominal pain, reflux symptoms and vomiting Musculoskeletal: positive for back pain, neck pain and stiff joints Endocrine: positive for temperature intolerance and tired, sluggish    Blood pressure 95/69, pulse 93, temperature 98.6 F (37 C), resp. rate 18, height 5\' 4"  (1.626 m), weight 104 lb (47.2 kg). Physical Exam  Constitutional: She is oriented to person, place, and time. Vital signs are normal. She appears malnourished.  Non-toxic appearance. She does not have a sickly appearance.  Thin  HENT:  Head: Normocephalic.  Eyes: Pupils are equal, round, and reactive to light.  Neck: Normal  range of motion.  Cardiovascular: Normal rate and regular rhythm.  Pulmonary/Chest: Effort normal and breath sounds normal.  Abdominal: Soft. She exhibits no distension. There is tenderness in the epigastric area.  RUQ incision   Musculoskeletal: Normal range of motion. She exhibits no edema.  Neurological: She is alert and oriented to person, place, and time.  Skin: Skin is warm and dry.  Psychiatric: Mood, memory, affect and judgment normal.  Vitals reviewed.  Results: Pathology- H pylori negative biopsies No basic labs sine 01/2017 in chart  No CT a/p  Gastrin 114  Assessment & Plan:  ORALIA CRIGER is a 52 y.o. female with chronic non-healing Type II ulcers that have been under surveillance for 8 months. She has been on PPI BID in the past without improvement, but has never stopped smoking and continued to use Goody's powder. Her biopsy and serum was negative for H pylori.  She has been tested with a gastrin level which was 114.  She has been sent for her non-healing ulcers and has started to have signs of stricture on her EGD s/p dilation.  She however has not been compliant with her smoking cessation or  cessation of the Goody's and this is likely the reason for her not having healed in the last 8 months.   She does however have a strange family history and a history at a very young age for ulcer disease that caused a perforation, and at that time she reports not smoking or using NSAIDs. Given this there could be something else going on that is also contributing such as a gastrinoma that is not being picked up by the gastrin testing, although unlikely.  I have discussed with her for true refractor ulcers, we consider surgery, and that historically the surgery was a first line treatment, but after PPIs and treatment for H pylori that this are usually not necessary.  We have discussed antrectomy to remove the gastrin producing cells and truncal vagotomy to reduce acid stimulation by the vagus nerve.  We also discussed the reconstruction with a Bilroth II gastrojejunostomy and the risks associated with this surgery including, bleeding, infection, risk of leaking, risk of dumping syndrome, and most high risk in her the risk of a marginal ulcer due to her continued smoking and NSAID use. This would effectively trade one ulcer type for another in a patient that is proving non-compliant, yet given the reported stricture at some point in the future we may be forced into this choice.    In order to rule out other pathologies contributing to her ulcer formation a final time, I have ordered: 1.) CT a/p with contrast to assess the stomach and to assess for any abnormal findings in the pancreas or gastrinoma triangle 2.) Repeat gastrin level and we are looking into secretin stimulation testing at [place]; I have asked her to come off her PPI for at least 1 week prior.  3.) H pylori stool antigen- and I have asked the patient to come off of her PPI for at least 1 week prior to this test  4.) Calcium level and PTH to assure no other hypersecretory causes   In my opinion this patient's ulcers are non-healing due to her  non-compliaince, and I am very concerned with performing and antrectomy and vagotomy due to the risk of marginal ulcer a in smoker and NSAID user.  I have counseled the patient on No NSAID use and to stop smoking. She reports understanding. I asked if she had  used any gums, etc to quit, but says she normally quits cold [place].   -Follow up following the completion of testing to discuss the risk and benefits again and to determine if a repeat EGD prior to any surgery is indicated pending patient actually being compliant.   All questions were answered to the satisfaction of the patient and family.  I have spent over 45 minutes with the patient and the family discussing the above information and counseling.  Virl Cagey 10/30/2017, 4:02 PM

## 2017-11-02 ENCOUNTER — Encounter: Payer: Self-pay | Admitting: Neurology

## 2017-11-12 ENCOUNTER — Encounter (INDEPENDENT_AMBULATORY_CARE_PROVIDER_SITE_OTHER): Payer: Self-pay | Admitting: Internal Medicine

## 2017-11-13 ENCOUNTER — Ambulatory Visit (INDEPENDENT_AMBULATORY_CARE_PROVIDER_SITE_OTHER): Payer: Medicaid Other | Admitting: Internal Medicine

## 2017-11-13 ENCOUNTER — Telehealth: Payer: Self-pay | Admitting: General Surgery

## 2017-11-13 ENCOUNTER — Encounter (INDEPENDENT_AMBULATORY_CARE_PROVIDER_SITE_OTHER): Payer: Self-pay | Admitting: Internal Medicine

## 2017-11-13 VITALS — BP 94/60 | HR 72 | Temp 98.4°F | Ht 63.0 in | Wt 104.3 lb

## 2017-11-13 DIAGNOSIS — K253 Acute gastric ulcer without hemorrhage or perforation: Secondary | ICD-10-CM | POA: Diagnosis not present

## 2017-11-13 NOTE — Progress Notes (Signed)
Subjective:    Patient ID: [name], female    DOB: 08-24-1966, 52 y.o.   MRN: [id number]  HPI Here today for f/u. Recently underwent an EGD in January for chronic gastric ulcer, chronic duodenal ulcer.  For therapy of chronic duodenal ulcer. Hx of taking Gabriel Earing Powder's daily. Recently saw D. Bridges for surgical consult for hx of multiple ulcers . At [facility] in February with Dr Constance Haw, she reported she was not taking any Goody Powders but continued to smoke. Dr. Constance Haw felt (per notes) that her ulcers are non-healing due to her non-compliance. She is not going back to see Dr. Constance Haw.  jFastin gastrin was normal.  H. Pylori was negative.  States she just saw Dr. Hassell Done yesterday in [place] (surgeon) for possible surgery.  Hx of ruptured ulcer in the 1990 by Dr. Tamala Julian.  She states Dr. Hassell Done is going to try to get records from the surgery and will make a decision.  She says she is not doing good. She is having a hard time eating. She has not lost any weight. She is drinking Boost daily. She denies taking any BC powders. She has early satiey BMs are okay.    10/16/2017 EGD: Dr. Laural Golden                            - LA Grade B reflux esophagitis.                           - Z-line regular, 38 cm from the incisors.                           - 2 cm hiatal hernia.                           - A small amount of food (residue) in the stomach.                           - Non-bleeding gastric ulcer with no stigmata of                            bleeding. Biopsied.                           - Gastritis.                           - One non-bleeding duodenal ulcer with no stigmata                            of bleeding.                           - Acquired duodenal stenosis. Dilated.                           - Normal second portion of the duodenum.                           Comment: Nonhealing ulcers. ? continued NSAID use.    Underwent  an EGD in October of 2018 for f/u of gastric ulcer.     Impression: - Normal proximal esophagus and mid esophagus. - Monilial esophagitis. Cells for cytology obtained. - Z-line regular, 37 cm from the incisors. - 2 cm hiatal hernia. - Non-bleeding gastric ulcer. Biopsied. Gastric  ulcer has increased in dimensions since last  examMay 2018. - One non-bleeding duodenal ulcer. Duodenal  ulcerith increased in dimensions since last exam of  May 2018. - Duodenal diverticulum.  Review of Systems Past Medical History:  Diagnosis Date  . Anxiety   . Depression   . Essential hypertension, benign 12/26/2016  . Fibroadenoma of right breast    S/P LUMPECTOMY W/ RADIOACTIVE SEED IMPLANT 05-19-2015  . Fibromyalgia   . GERD (gastroesophageal reflux disease)   . History of cervical dysplasia    s/p conzation and recurrent s/p  vaginal hysterectomy 2008  . History of duodenal ulcer    1992--  S/P  REPAIR PERFORATED ULCER  . History of gastric ulcer    age 59  . Hypertension   . IBS (irritable bowel syndrome)   . VIN II (vulvar intraepithelial neoplasia II)   . Wears glasses   . Wears partial dentures    lower    Past Surgical History:  Procedure Laterality Date  . BLADDER SLING PROCEDURE  2005  approx.  . CERVICAL CONIZATION W/BX  2006  approx  . CO2 LASER APPLICATION N/A 04/04/4579   Procedure: CO2 LASER OF THE VAGINA;  Surgeon: Everitt Amber, MD;  Location: [facility];  Service: Gynecology;  Laterality: N/A;  . ESOPHAGOGASTRODUODENOSCOPY N/A 01/21/2017   Procedure: ESOPHAGOGASTRODUODENOSCOPY (EGD);  Surgeon: Rogene Houston, MD;  [facility];  Service: Endoscopy;  Laterality: N/A;  . ESOPHAGOGASTRODUODENOSCOPY N/A 07/17/2017   Procedure:  ESOPHAGOGASTRODUODENOSCOPY (EGD);  Surgeon: Rogene Houston, MD;  [facility];  Service: Endoscopy;  Laterality: N/A;  1:00  . ESOPHAGOGASTRODUODENOSCOPY N/A 10/16/2017   Procedure: ESOPHAGOGASTRODUODENOSCOPY (EGD)WITH DILATION;  Surgeon: Rogene Houston, MD;  [facility];  Service: Endoscopy;  Laterality: N/A;  1030  . ESOPHAGOGASTRODUODENOSCOPY (EGD) WITH PROPOFOL N/A 01/23/2017   Procedure: ESOPHAGOGASTRODUODENOSCOPY (EGD) WITH PROPOFOL;  Surgeon: Rogene Houston, MD;  [facility];  Service: Endoscopy;  Laterality: N/A;  duodenal striture dilation  . RADIOACTIVE SEED GUIDED EXCISIONAL BREAST BIOPSY Right 05/19/2015   Procedure: EXCISIONAL RIGHT BREAST MASS WITH RADIOACTIVE SEED LOCALIZATION;  Surgeon: Fanny Skates, MD;  Location: [facility];  Service: General;  Laterality: Right;  . REPAIR OF PERFORATED ULCER  1992   duodenal  . VAGINAL HYSTERECTOMY  11/13/2006    Allergies  Allergen Reactions  . Hydrocodone Other (See Comments)    Nausea, feels hot and insomia  . Sulfa Antibiotics Hives    Current Outpatient Medications on File Prior to Visit  Medication Sig Dispense Refill  . amLODipine (NORVASC) 10 MG tablet Take 10 mg by mouth every morning.     Marland Kitchen esomeprazole (NEXIUM) 40 MG packet Take 40 mg by mouth daily before breakfast. 60 each 3  . Multiple Vitamins-Minerals (CENTRUM ADULTS) TABS Take 1 tablet by mouth daily.     . potassium chloride (K-DUR,KLOR-CON) 10 MEQ tablet Take 10 mEq by mouth every morning.    . traMADol (ULTRAM) 50 MG tablet Take 1 tablet (50 mg total) by mouth 3 (three) times daily as needed for moderate pain. 42 tablet 0  . valACYclovir (VALTREX) 1000 MG tablet Take 1,000 mg by mouth daily.  5  . vitamin B-12 (CYANOCOBALAMIN) 1000 MCG tablet Take 1,000 mcg by mouth daily.     No current facility-administered medications on file prior to visit.         Objective:   Physical Exam Blood pressure 94/60, pulse  72, temperature 98.4 F (36.9 C), height 5\' 3"  (1.6 m), weight 104 lb 4.8 oz (47.3 kg). Alert and oriented. Skin warm and dry. Oral mucosa is moist.   . Sclera anicteric, conjunctivae is pink. Thyroid not enlarged. No cervical lymphadenopathy. Lungs clear. Heart regular rate and rhythm.  Abdomen is soft. Bowel sounds are positive. No hepatomegaly. No abdominal masses felt.Epigastric tenderness.  No edema to lower extremities.           Assessment & Plan:  Gastric ulcers. She will continue the Nexium. She will follow up with Dr. Hassell Done.

## 2017-11-13 NOTE — Telephone Encounter (Signed)
[facility]  Sent Dr. Laural Golden an Epic message regarding patient. We have tried to call patient and left messages regarding the CT and lab workup but she is not returning out calls.  Also on reviewing her chart, she has requested referral to other surgeons. Will hold on trying to get any CT or lab in order to not impede other surgeon's ability to do any workup they would like in the future ,etc.   Curlene Labrum, MD [facility] [place], [place] [address] [phone] (office)

## 2017-11-13 NOTE — Patient Instructions (Signed)
Continue the Nexium. No Goody Powder's. Keep appt with Dr. Hassell Done.

## 2017-11-14 ENCOUNTER — Other Ambulatory Visit: Payer: Self-pay | Admitting: Surgery

## 2017-11-14 DIAGNOSIS — K279 Peptic ulcer, site unspecified, unspecified as acute or chronic, without hemorrhage or perforation: Secondary | ICD-10-CM

## 2017-11-19 ENCOUNTER — Ambulatory Visit
Admission: RE | Admit: 2017-11-19 | Discharge: 2017-11-19 | Disposition: A | Discharge status: Home / Self Care | Payer: Medicaid Other | Source: Ambulatory Visit | Attending: Surgery | Admitting: Surgery

## 2017-11-19 DIAGNOSIS — K279 Peptic ulcer, site unspecified, unspecified as acute or chronic, without hemorrhage or perforation: Secondary | ICD-10-CM

## 2017-11-20 ENCOUNTER — Ambulatory Visit: Payer: Medicaid Other | Admitting: Neurology

## 2017-11-28 NOTE — Progress Notes (Signed)
Please place orders in Epic as patient is being scheduled for a pre-op appointment! Thank you! 

## 2017-12-15 NOTE — Progress Notes (Signed)
01/21/2017- noted in Epic- EKG and 1 port CXR

## 2017-12-15 NOTE — Progress Notes (Signed)
Please place orders in Epic as patient has a pre-op appointment on 12/16/2017! Thank you!

## 2017-12-15 NOTE — Patient Instructions (Addendum)
[name]  12/15/2017   Your procedure is scheduled on: Friday 12/19/2017  Report to [facility] Main  Entrance              Report to admitting at   0730  AM    Call this number if you have problems the morning of surgery [phone]     Remember: Do not eat food  :After Midnight.   NO SOLID FOOD AFTER MIDNIGHT THE NIGHT PRIOR TO SURGERY. NOTHING BY MOUTH EXCEPT CLEAR LIQUIDS UNTIL 3 HOURS PRIOR TO [name] SURGERY. PLEASE FINISH ENSURE DRINK PER SURGEON ORDER 3 HOURS PRIOR TO SCHEDULED SURGERY TIME WHICH NEEDS TO BE COMPLETED AT  0630 am.    CLEAR LIQUID DIET   Foods Allowed                                                                     Foods Excluded  Coffee and tea, regular and decaf                             liquids that you cannot  Plain Jell-O in any flavor                                             see through such as: Fruit ices (not with fruit pulp)                                     milk, soups, orange juice  Iced Popsicles                                    All solid food Carbonated beverages, regular and diet                                    Cranberry, grape and apple juices Sports drinks like Gatorade Lightly seasoned clear broth or consume(fat free) Sugar, honey syrup  Sample Menu Breakfast                                Lunch                                     Supper Cranberry juice                    Beef broth                            Chicken broth Jell-O  Grape juice                           Apple juice Coffee or tea                        Jell-O                                      Popsicle                                                Coffee or tea                        Coffee or tea  _____________________________________________________________________    Take these medicines the morning of surgery with A SIP OF WATER: Amlodipine (Norvasc), Esomeprazole (Nexium)                                 You may not have any metal on your body including hair pins and              piercings  Do not wear jewelry, make-up, lotions, powders or perfumes, deodorant             Do not wear nail polish.  Do not shave  48 hours prior to surgery.              Men may shave face and neck.   Do not bring valuables to the hospital. [name].  Contacts, dentures or bridgework may not be worn into surgery.  Leave suitcase in the car. After surgery it may be brought to your room.                  Please read over the following fact sheets you were given: _____________________________________________________________________             [facility] - Preparing for Surgery Before surgery, you can play an important role.  Because skin is not sterile, your skin needs to be as free of germs as possible.  You can reduce the number of germs on your skin by washing with CHG (chlorahexidine gluconate) soap before surgery.  CHG is an antiseptic cleaner which kills germs and bonds with the skin to continue killing germs even after washing. Please DO NOT use if you have an allergy to CHG or antibacterial soaps.  If your skin becomes reddened/irritated stop using the CHG and inform your nurse when you arrive at Short Stay. Do not shave (including legs and underarms) for at least 48 hours prior to the first CHG shower.  You may shave your face/neck. Please follow these instructions carefully:  1.  Shower with CHG Soap the night before surgery and the  morning of Surgery.  2.  If you choose to wash your hair, wash your hair first as usual with your  normal  shampoo.  3.  After you shampoo, rinse your hair and body thoroughly to remove the  shampoo.  4.  Use CHG as you would any other liquid soap.  You can apply chg directly  to the skin and wash                       Gently with a scrungie or clean washcloth.  5.  Apply the  CHG Soap to your body ONLY FROM THE NECK DOWN.   Do not use on face/ open                           Wound or open sores. Avoid contact with eyes, ears mouth and genitals (private parts).                       Wash face,  Genitals (private parts) with your normal soap.             6.  Wash thoroughly, paying special attention to the area where your surgery  will be performed.  7.  Thoroughly rinse your body with warm water from the neck down.  8.  DO NOT shower/wash with your normal soap after using and rinsing off  the CHG Soap.                9.  Pat yourself dry with a clean towel.            10.  Wear clean pajamas.            11.  Place clean sheets on your bed the night of your first shower and do not  sleep with pets. Day of Surgery : Do not apply any lotions/deodorants the morning of surgery.  Please wear clean clothes to the hospital/surgery center.  FAILURE TO FOLLOW THESE INSTRUCTIONS MAY RESULT IN THE CANCELLATION OF YOUR SURGERY PATIENT SIGNATURE_________________________________  NURSE SIGNATURE__________________________________  ________________________________________________________________________   [name]  An incentive spirometer is a tool that can help keep your lungs clear and active. This tool measures how well you are filling your lungs with each breath. Taking long deep breaths may help reverse or decrease the chance of developing breathing (pulmonary) problems (especially infection) following:  A long period of time when you are unable to move or be active. BEFORE THE PROCEDURE   If the spirometer includes an indicator to show your best effort, your nurse or respiratory therapist will set it to a desired goal.  If possible, sit up straight or lean slightly forward. Try not to slouch.  Hold the incentive spirometer in an upright position. INSTRUCTIONS FOR USE  1. Sit on the edge of your bed if possible, or sit up as far as you can in bed or on a  chair. 2. Hold the incentive spirometer in an upright position. 3. Breathe out normally. 4. Place the mouthpiece in your mouth and seal your lips tightly around it. 5. Breathe in slowly and as deeply as possible, raising the piston or the ball toward the top of the column. 6. Hold your breath for 3-5 seconds or for as long as possible. Allow the piston or ball to fall to the bottom of the column. 7. Remove the mouthpiece from your mouth and breathe out normally. 8. Rest for a few seconds and repeat Steps 1 through 7 at least 10 times every 1-2 hours when you are awake. Take your time and take a few normal breaths between deep breaths. 9. The spirometer may include an indicator to  show your best effort. Use the indicator as a goal to work toward during each repetition. 10. After each set of 10 deep breaths, practice coughing to be sure your lungs are clear. If you have an incision (the cut made at the time of surgery), support your incision when coughing by placing a pillow or rolled up towels firmly against it. Once you are able to get out of bed, walk around indoors and cough well. You may stop using the incentive spirometer when instructed by your caregiver.  RISKS AND COMPLICATIONS  Take your time so you do not get dizzy or light-headed.  If you are in pain, you may need to take or ask for pain medication before doing incentive spirometry. It is harder to take a deep breath if you are having pain. AFTER USE  Rest and breathe slowly and easily.  It can be helpful to keep track of a log of your progress. Your caregiver can provide you with a simple table to help with this. If you are using the spirometer at home, follow these instructions: [place] IF:   You are having difficultly using the spirometer.  You have trouble using the spirometer as often as instructed.  Your pain medication is not giving enough relief while using the spirometer.  You develop fever of 100.5 F  (38.1 C) or higher. SEEK IMMEDIATE MEDICAL CARE IF:   You cough up bloody sputum that had not been present before.  You develop fever of 102 F (38.9 C) or greater.  You develop worsening pain at or near the incision site. MAKE SURE YOU:   Understand these instructions.  Will watch your condition.  Will get help right away if you are not doing well or get worse. Document Released: 01/20/2007 Document Revised: 12/02/2011 Document Reviewed: 03/23/2007 [facility] Patient Information 2014 [place], [place].   ________________________________________________________________________

## 2017-12-16 ENCOUNTER — Encounter (HOSPITAL_COMMUNITY): Payer: Self-pay

## 2017-12-16 ENCOUNTER — Encounter (HOSPITAL_COMMUNITY)
Admission: RE | Admit: 2017-12-16 | Discharge: 2017-12-16 | Disposition: A | Discharge status: Home / Self Care | Payer: Medicaid Other | Source: Ambulatory Visit | Attending: Surgery | Admitting: Surgery

## 2017-12-16 ENCOUNTER — Other Ambulatory Visit: Payer: Self-pay

## 2017-12-16 DIAGNOSIS — Z01812 Encounter for preprocedural laboratory examination: Secondary | ICD-10-CM | POA: Insufficient documentation

## 2017-12-16 LAB — CBC
HEMATOCRIT: 45.3 % (ref 36.0–46.0)
HEMOGLOBIN: 14.6 g/dL (ref 12.0–15.0)
MCH: 34.4 pg — ABNORMAL HIGH (ref 26.0–34.0)
MCHC: 32.2 g/dL (ref 30.0–36.0)
MCV: 106.6 fL — ABNORMAL HIGH (ref 78.0–100.0)
Platelets: 430 10*3/uL — ABNORMAL HIGH (ref 150–400)
RBC: 4.25 MIL/uL (ref 3.87–5.11)
RDW: 13.2 % (ref 11.5–15.5)
WBC: 8.3 10*3/uL (ref 4.0–10.5)

## 2017-12-16 LAB — BASIC METABOLIC PANEL
Anion gap: 9 (ref 5–15)
BUN: 27 mg/dL — ABNORMAL HIGH (ref 6–20)
CO2: 26 mmol/L (ref 22–32)
Calcium: 9.6 mg/dL (ref 8.9–10.3)
Chloride: 105 mmol/L (ref 101–111)
Creatinine, Ser: 0.74 mg/dL (ref 0.44–1.00)
GFR calc Af Amer: >60 mL/min (ref >60)
Glucose, Bld: 76 mg/dL (ref 65–99)
POTASSIUM: 4.7 mmol/L (ref 3.5–5.1)
SODIUM: 140 mmol/L (ref 135–145)

## 2017-12-16 NOTE — Progress Notes (Signed)
Please place orders I Epic as patient has surgery on Friday 12/19/2017! Thank you!

## 2017-12-17 ENCOUNTER — Ambulatory Visit: Payer: Self-pay | Admitting: Surgery

## 2017-12-17 NOTE — H&P (Signed)
[name] Documented: 11/27/2017 11:40 AM Location: [place] Office Patient #: [id number] DOB: 1966-07-12 Single / Language: [name] / Race: White Female   History of Present Illness Rodman Key B. Hassell Done MD; 11/27/2017 12:18 PM) Patient words: The patient is a 52 year old female who presents with non-malignant abdominal pain. She was referred by Dr. Laural Golden with copies of her endoscopy. She is Environmental manager. 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 There is no operative report listed but she had an operation by Dr. Cleaster Corin that has a right subcostal incision. She was undergoing urgent surgery for abdominal pain bloating and that grew very acute. He appeared to have been operating on for gallbladder but according to her it sounds like he found a perforated ulcer. He may have done a Associate Professor.  Her review Dr. Chrys Racer endoscopy pictures and Epic and can see what appear to be ulcers in the duodenal region and prepyloric region. I think as a first step we need to get a copy of Dr. Thompson Caul operative note to make sure of what he did and then to get an upper GI series to look at her anatomy. Good upper GI series will give [place] some idea of her esophageal motility as well as look at any scarring that is present in her pyloric region and in the duodenal bulb. We'll go and get those studies and then I will see her back in the office.  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 This will need a H-M pyloroplasty or a Jaboulay or a gastro jejunostomy. I explained this to them in detail. Will proceed at [facility] with laparoscopy and laparotomy.  The patient is a 52 year old female.   Allergies Malachi Bonds, CMA; 11/27/2017 11:40 AM) Sulfa 10 *OPHTHALMIC AGENTS*   Medication History (Chemira Jones, CMA; 11/27/2017 11:40 AM) NexIUM (40MG  Capsule DR, Oral) Active. Multivitamin Adult (Oral) Active. Potassium Chloride (10MEQ Capsule ER, Oral)  Active. ValACYclovir HCl (1GM Tablet, Oral) Active. Vitamin B12 (1000MCG Tablet ER, Oral) Active. TraMADol HCl (50MG  Tablet, Oral) Active. AmLODIPine Besylate (10MG  Tablet, Oral) Active. Medications Reconciled  Vitals (Chemira Jones CMA; 11/27/2017 11:40 AM) 11/27/2017 11:40 AM Weight: 106.8 lb Height: 64in Body Surface Area: 1.5 m Body Mass Index: 18.33 kg/m  Pulse: 83 (Regular)        Physical Exam (Lestat Golob B. Hassell Done MD; 11/27/2017 12:19 PM) General Note: Thin WF HEENT unremarkable Neck supple Chest clear Heart SR without murmurs Abdomen flat with right subcostal incision Ext FROM     Assessment & Plan Rodman Key B. Hassell Done MD; 11/27/2017 12:20 PM) PARTIAL GASTRIC OUTLET OBSTRUCTION (K31.1) Impression: Will schedule pyloroplasty or gastric bypass of peptic stricture.

## 2017-12-17 NOTE — H&P (View-Only) (Signed)
[name] Documented: 11/27/2017 11:40 AM Location: [place] Office Patient #: [id number] DOB: 06-09-1966 Single / Language: [name] / Race: White Female   History of Present Illness Rodman Key B. Hassell Done MD; 11/27/2017 12:18 PM) Patient words: The patient is a 52 year old female who presents with non-malignant abdominal pain. She was referred by Dr. Laural Golden with copies of her endoscopy. She is Environmental manager. 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 There is no operative report listed but she had an operation by Dr. Cleaster Corin that has a right subcostal incision. She was undergoing urgent surgery for abdominal pain bloating and that grew very acute. He appeared to have been operating on for gallbladder but according to her it sounds like he found a perforated ulcer. He may have done a Associate Professor.  Her review Dr. Chrys Racer endoscopy pictures and Epic and can see what appear to be ulcers in the duodenal region and prepyloric region. I think as a first step we need to get a copy of Dr. Thompson Caul operative note to make sure of what he did and then to get an upper GI series to look at her anatomy. Good upper GI series will give [place] some idea of her esophageal motility as well as look at any scarring that is present in her pyloric region and in the duodenal bulb. We'll go and get those studies and then I will see her back in the office.  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 This will need a H-M pyloroplasty or a Jaboulay or a gastro jejunostomy. I explained this to them in detail. Will proceed at [facility] with laparoscopy and laparotomy.  The patient is a 52 year old female.   Allergies Malachi Bonds, CMA; 11/27/2017 11:40 AM) Sulfa 10 *OPHTHALMIC AGENTS*   Medication History (Chemira Jones, CMA; 11/27/2017 11:40 AM) NexIUM (40MG  Capsule DR, Oral) Active. Multivitamin Adult (Oral) Active. Potassium Chloride (10MEQ Capsule ER, Oral)  Active. ValACYclovir HCl (1GM Tablet, Oral) Active. Vitamin B12 (1000MCG Tablet ER, Oral) Active. TraMADol HCl (50MG  Tablet, Oral) Active. AmLODIPine Besylate (10MG  Tablet, Oral) Active. Medications Reconciled  Vitals (Chemira Jones CMA; 11/27/2017 11:40 AM) 11/27/2017 11:40 AM Weight: 106.8 lb Height: 64in Body Surface Area: 1.5 m Body Mass Index: 18.33 kg/m  Pulse: 83 (Regular)        Physical Exam (Legacie Dillingham B. Hassell Done MD; 11/27/2017 12:19 PM) General Note: Thin WF HEENT unremarkable Neck supple Chest clear Heart SR without murmurs Abdomen flat with right subcostal incision Ext FROM     Assessment & Plan Rodman Key B. Hassell Done MD; 11/27/2017 12:20 PM) PARTIAL GASTRIC OUTLET OBSTRUCTION (K31.1) Impression: Will schedule pyloroplasty or gastric bypass of peptic stricture.

## 2017-12-19 ENCOUNTER — Inpatient Hospital Stay: Payer: Self-pay

## 2017-12-19 ENCOUNTER — Ambulatory Visit (HOSPITAL_COMMUNITY): Payer: Medicaid Other | Admitting: Certified Registered Nurse Anesthetist

## 2017-12-19 ENCOUNTER — Other Ambulatory Visit: Payer: Self-pay

## 2017-12-19 ENCOUNTER — Encounter (HOSPITAL_COMMUNITY): Payer: Self-pay | Admitting: *Deleted

## 2017-12-19 ENCOUNTER — Encounter (HOSPITAL_COMMUNITY)
Admission: RE | Disposition: A | Discharge status: Home / Self Care | Payer: Self-pay | Source: Ambulatory Visit | Attending: Surgery

## 2017-12-19 ENCOUNTER — Inpatient Hospital Stay (HOSPITAL_COMMUNITY)
Admission: RE | Admit: 2017-12-19 | Discharge: 2017-12-26 | DRG: 326 | Disposition: A | Discharge status: Home / Self Care | Payer: Medicaid Other | Source: Ambulatory Visit | Attending: Surgery | Admitting: Surgery

## 2017-12-19 DIAGNOSIS — E440 Moderate protein-calorie malnutrition: Secondary | ICD-10-CM

## 2017-12-19 DIAGNOSIS — I10 Essential (primary) hypertension: Secondary | ICD-10-CM | POA: Diagnosis present

## 2017-12-19 DIAGNOSIS — Z8711 Personal history of peptic ulcer disease: Secondary | ICD-10-CM | POA: Diagnosis not present

## 2017-12-19 DIAGNOSIS — K267 Chronic duodenal ulcer without hemorrhage or perforation: Secondary | ICD-10-CM | POA: Diagnosis present

## 2017-12-19 DIAGNOSIS — Y95 Nosocomial condition: Secondary | ICD-10-CM | POA: Diagnosis not present

## 2017-12-19 DIAGNOSIS — Z882 Allergy status to sulfonamides status: Secondary | ICD-10-CM | POA: Diagnosis not present

## 2017-12-19 DIAGNOSIS — Z9889 Other specified postprocedural states: Secondary | ICD-10-CM

## 2017-12-19 DIAGNOSIS — Z79899 Other long term (current) drug therapy: Secondary | ICD-10-CM

## 2017-12-19 DIAGNOSIS — J189 Pneumonia, unspecified organism: Secondary | ICD-10-CM | POA: Diagnosis not present

## 2017-12-19 DIAGNOSIS — K311 Adult hypertrophic pyloric stenosis: Secondary | ICD-10-CM | POA: Diagnosis present

## 2017-12-19 DIAGNOSIS — K219 Gastro-esophageal reflux disease without esophagitis: Secondary | ICD-10-CM | POA: Diagnosis present

## 2017-12-19 DIAGNOSIS — F17210 Nicotine dependence, cigarettes, uncomplicated: Secondary | ICD-10-CM | POA: Diagnosis present

## 2017-12-19 DIAGNOSIS — F419 Anxiety disorder, unspecified: Secondary | ICD-10-CM | POA: Diagnosis present

## 2017-12-19 DIAGNOSIS — Z681 Body mass index (BMI) 19 or less, adult: Secondary | ICD-10-CM

## 2017-12-19 DIAGNOSIS — R109 Unspecified abdominal pain: Secondary | ICD-10-CM | POA: Diagnosis present

## 2017-12-19 DIAGNOSIS — R509 Fever, unspecified: Secondary | ICD-10-CM

## 2017-12-19 HISTORY — PX: LAPAROSCOPY: SHX197

## 2017-12-19 LAB — COMPREHENSIVE METABOLIC PANEL
ALK PHOS: 57 U/L (ref 38–126)
ALT: 43 U/L (ref 14–54)
ANION GAP: 13 (ref 5–15)
AST: 62 U/L — ABNORMAL HIGH (ref 15–41)
Albumin: 3.3 g/dL — ABNORMAL LOW (ref 3.5–5.0)
BILIRUBIN TOTAL: 0.2 mg/dL — AB (ref 0.3–1.2)
BUN: 16 mg/dL (ref 6–20)
CALCIUM: 8.6 mg/dL — AB (ref 8.9–10.3)
CO2: 22 mmol/L (ref 22–32)
CREATININE: 0.72 mg/dL (ref 0.44–1.00)
Chloride: 106 mmol/L (ref 101–111)
GFR calc non Af Amer: >60 mL/min (ref >60)
GLUCOSE: 121 mg/dL — AB (ref 65–99)
Potassium: 3.8 mmol/L (ref 3.5–5.1)
SODIUM: 141 mmol/L (ref 135–145)
TOTAL PROTEIN: 6.5 g/dL (ref 6.5–8.1)

## 2017-12-19 LAB — CBC
HEMATOCRIT: 41.6 % (ref 36.0–46.0)
Hemoglobin: 13.8 g/dL (ref 12.0–15.0)
MCH: 35 pg — AB (ref 26.0–34.0)
MCHC: 33.2 g/dL (ref 30.0–36.0)
MCV: 105.6 fL — AB (ref 78.0–100.0)
PLATELETS: 355 10*3/uL (ref 150–400)
RBC: 3.94 MIL/uL (ref 3.87–5.11)
RDW: 13.2 % (ref 11.5–15.5)
WBC: 19.2 10*3/uL — AB (ref 4.0–10.5)

## 2017-12-19 LAB — PHOSPHORUS: Phosphorus: 2 mg/dL — ABNORMAL LOW (ref 2.5–4.6)

## 2017-12-19 LAB — GLUCOSE, CAPILLARY: Glucose-Capillary: 213 mg/dL — ABNORMAL HIGH (ref 65–99)

## 2017-12-19 LAB — MAGNESIUM: Magnesium: 1.8 mg/dL (ref 1.7–2.4)

## 2017-12-19 LAB — CREATININE, SERUM: Creatinine, Ser: 0.72 mg/dL (ref 0.44–1.00)

## 2017-12-19 SURGERY — LAPAROSCOPY, DIAGNOSTIC
Anesthesia: General

## 2017-12-19 MED ORDER — INSULIN ASPART 100 UNIT/ML ~~LOC~~ SOLN
0.0000 [IU] | Freq: Four times a day (QID) | SUBCUTANEOUS | Status: DC
  Administered 2017-12-19: 3 [IU] via SUBCUTANEOUS
  Administered 2017-12-20 – 2017-12-25 (×7): 1 [IU] via SUBCUTANEOUS

## 2017-12-19 MED ORDER — LIDOCAINE 2% (20 MG/ML) 5 ML SYRINGE
INTRAMUSCULAR | Status: AC
  Filled 2017-12-19: qty 10

## 2017-12-19 MED ORDER — HYDROMORPHONE HCL 1 MG/ML IJ SOLN
INTRAMUSCULAR | Status: AC
  Filled 2017-12-19: qty 1

## 2017-12-19 MED ORDER — HEPARIN SODIUM (PORCINE) 5000 UNIT/ML IJ SOLN
5000.0000 [IU] | Freq: Once | INTRAMUSCULAR | Status: AC
  Administered 2017-12-19: 5000 [IU] via SUBCUTANEOUS
  Filled 2017-12-19: qty 1

## 2017-12-19 MED ORDER — MIDAZOLAM HCL 2 MG/2ML IJ SOLN: 1.0000 mg | Freq: Once | INTRAMUSCULAR | Status: DC

## 2017-12-19 MED ORDER — KCL IN DEXTROSE-NACL 20-5-0.45 MEQ/L-%-% IV SOLN
INTRAVENOUS | Status: DC
  Administered 2017-12-19: 1000 mL via INTRAVENOUS
  Filled 2017-12-19: qty 1000

## 2017-12-19 MED ORDER — FLEET ENEMA 7-19 GM/118ML RE ENEM: 1.0000 | ENEMA | Freq: Once | RECTAL | Status: DC | PRN

## 2017-12-19 MED ORDER — MIDAZOLAM HCL 5 MG/5ML IJ SOLN
INTRAMUSCULAR | Status: DC | PRN
  Administered 2017-12-19 (×2): 1 mg via INTRAVENOUS

## 2017-12-19 MED ORDER — SODIUM CHLORIDE 0.9 % IV SOLN
2.0000 g | Freq: Two times a day (BID) | INTRAVENOUS | Status: AC
  Administered 2017-12-19: 2 g via INTRAVENOUS
  Filled 2017-12-19: qty 2

## 2017-12-19 MED ORDER — MIDAZOLAM HCL 2 MG/2ML IJ SOLN
0.5000 mg | Freq: Once | INTRAMUSCULAR | Status: AC | PRN
  Administered 2017-12-19: 1 mg via INTRAVENOUS

## 2017-12-19 MED ORDER — PROMETHAZINE HCL 25 MG/ML IJ SOLN
6.2500 mg | INTRAMUSCULAR | Status: DC | PRN
  Administered 2017-12-19: 6.25 mg via INTRAVENOUS

## 2017-12-19 MED ORDER — CHLORHEXIDINE GLUCONATE CLOTH 2 % EX PADS: 6.0000 | MEDICATED_PAD | Freq: Once | CUTANEOUS | Status: DC

## 2017-12-19 MED ORDER — SUGAMMADEX SODIUM 200 MG/2ML IV SOLN
INTRAVENOUS | Status: DC | PRN
  Administered 2017-12-19: 100 mg via INTRAVENOUS

## 2017-12-19 MED ORDER — PNEUMOCOCCAL VAC POLYVALENT 25 MCG/0.5ML IJ INJ
0.5000 mL | INJECTION | INTRAMUSCULAR | Status: DC
  Filled 2017-12-19: qty 0.5

## 2017-12-19 MED ORDER — LACTATED RINGERS IR SOLN
Status: DC | PRN
  Administered 2017-12-19: 1000 mL

## 2017-12-19 MED ORDER — MIDAZOLAM HCL 2 MG/2ML IJ SOLN
INTRAMUSCULAR | Status: AC
  Filled 2017-12-19: qty 2

## 2017-12-19 MED ORDER — EPHEDRINE SULFATE-NACL 50-0.9 MG/10ML-% IV SOSY
PREFILLED_SYRINGE | INTRAVENOUS | Status: DC | PRN
  Administered 2017-12-19 (×3): 10 mg via INTRAVENOUS

## 2017-12-19 MED ORDER — HEPARIN SODIUM (PORCINE) 5000 UNIT/ML IJ SOLN
5000.0000 [IU] | Freq: Three times a day (TID) | INTRAMUSCULAR | Status: DC
  Administered 2017-12-19 – 2017-12-26 (×20): 5000 [IU] via SUBCUTANEOUS
  Filled 2017-12-19 (×21): qty 1

## 2017-12-19 MED ORDER — ONDANSETRON HCL 4 MG/2ML IJ SOLN
INTRAMUSCULAR | Status: DC | PRN
  Administered 2017-12-19: 4 mg via INTRAVENOUS

## 2017-12-19 MED ORDER — SODIUM CHLORIDE 0.9 % IJ SOLN
INTRAMUSCULAR | Status: DC | PRN
  Administered 2017-12-19: 10 mL

## 2017-12-19 MED ORDER — TRACE MINERALS CR-CU-MN-SE-ZN 10-1000-500-60 MCG/ML IV SOLN
INTRAVENOUS | Status: AC
  Administered 2017-12-19: 20:00:00 via INTRAVENOUS
  Filled 2017-12-19: qty 720

## 2017-12-19 MED ORDER — FENTANYL CITRATE (PF) 100 MCG/2ML IJ SOLN
INTRAMUSCULAR | Status: DC | PRN
  Administered 2017-12-19 (×5): 50 ug via INTRAVENOUS

## 2017-12-19 MED ORDER — MEPERIDINE HCL 50 MG/ML IJ SOLN
INTRAMUSCULAR | Status: AC
  Filled 2017-12-19: qty 1

## 2017-12-19 MED ORDER — LACTATED RINGERS IV SOLN
INTRAVENOUS | Status: DC
  Administered 2017-12-19: 08:00:00 via INTRAVENOUS

## 2017-12-19 MED ORDER — FAT EMULSION 20 % IV EMUL
240.0000 mL | INTRAVENOUS | Status: AC
  Filled 2017-12-19: qty 250

## 2017-12-19 MED ORDER — ACETAMINOPHEN 500 MG PO TABS
1000.0000 mg | ORAL_TABLET | ORAL | Status: AC
  Administered 2017-12-19: 1000 mg via ORAL
  Filled 2017-12-19: qty 2

## 2017-12-19 MED ORDER — MEPERIDINE HCL 50 MG/ML IJ SOLN
6.2500 mg | INTRAMUSCULAR | Status: DC | PRN
  Administered 2017-12-19: 12.5 mg via INTRAVENOUS

## 2017-12-19 MED ORDER — FENTANYL CITRATE (PF) 250 MCG/5ML IJ SOLN
INTRAMUSCULAR | Status: AC
  Filled 2017-12-19: qty 5

## 2017-12-19 MED ORDER — LIDOCAINE 2% (20 MG/ML) 5 ML SYRINGE
INTRAMUSCULAR | Status: DC | PRN
  Administered 2017-12-19: 50 mg via INTRAVENOUS

## 2017-12-19 MED ORDER — POTASSIUM PHOSPHATES 15 MMOLE/5ML IV SOLN
15.0000 mmol | Freq: Once | INTRAVENOUS | Status: AC
  Administered 2017-12-19: 15 mmol via INTRAVENOUS
  Filled 2017-12-19: qty 5

## 2017-12-19 MED ORDER — LIDOCAINE 2% (20 MG/ML) 5 ML SYRINGE
INTRAMUSCULAR | Status: DC | PRN
  Administered 2017-12-19: 1.5 mg/kg/h via INTRAVENOUS

## 2017-12-19 MED ORDER — ONDANSETRON HCL 4 MG/2ML IJ SOLN
4.0000 mg | Freq: Four times a day (QID) | INTRAMUSCULAR | Status: DC | PRN
  Administered 2017-12-22: 4 mg via INTRAVENOUS

## 2017-12-19 MED ORDER — SCOPOLAMINE 1 MG/3DAYS TD PT72
MEDICATED_PATCH | TRANSDERMAL | Status: DC | PRN
  Administered 2017-12-19: 1 via TRANSDERMAL

## 2017-12-19 MED ORDER — KETAMINE HCL 10 MG/ML IJ SOLN
INTRAMUSCULAR | Status: DC | PRN
  Administered 2017-12-19: 10 mg via INTRAVENOUS

## 2017-12-19 MED ORDER — CEFOTETAN DISODIUM-DEXTROSE 2-2.08 GM-%(50ML) IV SOLR
2.0000 g | INTRAVENOUS | Status: AC
  Administered 2017-12-19: 2 g via INTRAVENOUS
  Filled 2017-12-19: qty 50

## 2017-12-19 MED ORDER — PROPOFOL 10 MG/ML IV BOLUS
INTRAVENOUS | Status: AC
  Filled 2017-12-19: qty 20

## 2017-12-19 MED ORDER — SODIUM CHLORIDE 0.9 % IJ SOLN
INTRAMUSCULAR | Status: AC
  Filled 2017-12-19: qty 10

## 2017-12-19 MED ORDER — FAT EMULSION 20 % IV EMUL
240.0000 mL | Freq: Once | INTRAVENOUS | Status: AC
  Administered 2017-12-19: 240 mL via INTRAVENOUS
  Filled 2017-12-19: qty 250

## 2017-12-19 MED ORDER — KCL IN DEXTROSE-NACL 20-5-0.45 MEQ/L-%-% IV SOLN
INTRAVENOUS | Status: AC
  Filled 2017-12-19: qty 1000

## 2017-12-19 MED ORDER — ACETAMINOPHEN 10 MG/ML IV SOLN
INTRAVENOUS | Status: AC
  Filled 2017-12-19: qty 100

## 2017-12-19 MED ORDER — SODIUM CHLORIDE 0.9% FLUSH
10.0000 mL | INTRAVENOUS | Status: DC | PRN
  Administered 2017-12-25: 10 mL
  Filled 2017-12-19: qty 40

## 2017-12-19 MED ORDER — MAGNESIUM SULFATE IN D5W 1-5 GM/100ML-% IV SOLN
1.0000 g | Freq: Once | INTRAVENOUS | Status: AC
  Administered 2017-12-19: 1 g via INTRAVENOUS
  Filled 2017-12-19: qty 100

## 2017-12-19 MED ORDER — HYDROMORPHONE HCL 1 MG/ML IJ SOLN
0.2500 mg | INTRAMUSCULAR | Status: DC | PRN
Start: 2017-12-19 — End: 2017-12-19
  Administered 2017-12-19 (×4): 0.5 mg via INTRAVENOUS

## 2017-12-19 MED ORDER — FENTANYL CITRATE (PF) 100 MCG/2ML IJ SOLN
12.5000 ug | INTRAMUSCULAR | Status: DC | PRN
  Administered 2017-12-19 – 2017-12-20 (×5): 12.5 ug via INTRAVENOUS
  Filled 2017-12-19 (×6): qty 2

## 2017-12-19 MED ORDER — ONDANSETRON 4 MG PO TBDP: 4.0000 mg | ORAL_TABLET | Freq: Four times a day (QID) | ORAL | Status: DC | PRN

## 2017-12-19 MED ORDER — KETAMINE HCL 10 MG/ML IJ SOLN
INTRAMUSCULAR | Status: AC
  Filled 2017-12-19: qty 1

## 2017-12-19 MED ORDER — KCL IN DEXTROSE-NACL 20-5-0.45 MEQ/L-%-% IV SOLN
INTRAVENOUS | Status: AC
  Administered 2017-12-19: 70 mL/h via INTRAVENOUS
  Administered 2017-12-20: 17:00:00 via INTRAVENOUS
  Filled 2017-12-19: qty 1000

## 2017-12-19 MED ORDER — PROMETHAZINE HCL 25 MG/ML IJ SOLN
INTRAMUSCULAR | Status: AC
  Filled 2017-12-19: qty 1

## 2017-12-19 MED ORDER — ACETAMINOPHEN 10 MG/ML IV SOLN
1000.0000 mg | Freq: Four times a day (QID) | INTRAVENOUS | Status: AC
  Administered 2017-12-19 – 2017-12-20 (×4): 1000 mg via INTRAVENOUS
  Filled 2017-12-19 (×3): qty 100

## 2017-12-19 MED ORDER — BUPIVACAINE LIPOSOME 1.3 % IJ SUSP
20.0000 mL | Freq: Once | INTRAMUSCULAR | Status: DC
  Filled 2017-12-19: qty 20

## 2017-12-19 MED ORDER — DEXAMETHASONE SODIUM PHOSPHATE 4 MG/ML IJ SOLN
INTRAMUSCULAR | Status: DC | PRN
  Administered 2017-12-19: 8 mg via INTRAVENOUS

## 2017-12-19 MED ORDER — HYDRALAZINE HCL 20 MG/ML IJ SOLN: 10.0000 mg | INTRAMUSCULAR | Status: DC | PRN

## 2017-12-19 MED ORDER — HYDROMORPHONE HCL 1 MG/ML IJ SOLN
0.2500 mg | INTRAMUSCULAR | Status: DC | PRN
  Administered 2017-12-19 (×2): 0.5 mg via INTRAVENOUS

## 2017-12-19 MED ORDER — PANTOPRAZOLE SODIUM 40 MG IV SOLR
40.0000 mg | Freq: Every day | INTRAVENOUS | Status: DC
  Administered 2017-12-19 – 2017-12-25 (×7): 40 mg via INTRAVENOUS
  Filled 2017-12-19 (×7): qty 40

## 2017-12-19 MED ORDER — GABAPENTIN 300 MG PO CAPS
300.0000 mg | ORAL_CAPSULE | ORAL | Status: AC
  Administered 2017-12-19: 300 mg via ORAL
  Filled 2017-12-19: qty 1

## 2017-12-19 MED ORDER — PROPOFOL 10 MG/ML IV BOLUS
INTRAVENOUS | Status: DC | PRN
  Administered 2017-12-19: 30 mg via INTRAVENOUS
  Administered 2017-12-19: 120 mg via INTRAVENOUS
  Administered 2017-12-19: 30 mg via INTRAVENOUS

## 2017-12-19 MED ORDER — PHENYLEPHRINE 40 MCG/ML (10ML) SYRINGE FOR IV PUSH (FOR BLOOD PRESSURE SUPPORT)
PREFILLED_SYRINGE | INTRAVENOUS | Status: DC | PRN
  Administered 2017-12-19: 40 ug via INTRAVENOUS
  Administered 2017-12-19: 80 ug via INTRAVENOUS
  Administered 2017-12-19 (×3): 40 ug via INTRAVENOUS
  Administered 2017-12-19: 80 ug via INTRAVENOUS

## 2017-12-19 MED ORDER — BUPIVACAINE LIPOSOME 1.3 % IJ SUSP
INTRAMUSCULAR | Status: DC | PRN
  Administered 2017-12-19: 30 mL

## 2017-12-19 MED ORDER — ROCURONIUM BROMIDE 50 MG/5ML IV SOSY
PREFILLED_SYRINGE | INTRAVENOUS | Status: DC | PRN
  Administered 2017-12-19 (×2): 10 mg via INTRAVENOUS
  Administered 2017-12-19: 20 mg via INTRAVENOUS
  Administered 2017-12-19: 50 mg via INTRAVENOUS

## 2017-12-19 MED ORDER — SCOPOLAMINE 1 MG/3DAYS TD PT72
MEDICATED_PATCH | TRANSDERMAL | Status: AC
  Filled 2017-12-19: qty 1

## 2017-12-19 SURGICAL SUPPLY — 67 items
APPLICATOR COTTON TIP 6IN STRL (MISCELLANEOUS) IMPLANT
BENZOIN TINCTURE PRP APPL 2/3 (GAUZE/BANDAGES/DRESSINGS) IMPLANT
BLADE SURG 15 STRL LF DISP TIS (BLADE) ×1 IMPLANT
BLADE SURG 15 STRL SS (BLADE) ×2
CABLE HIGH FREQUENCY MONO STRZ (ELECTRODE) ×3 IMPLANT
CELLS DAT CNTRL 66122 CELL SVR (MISCELLANEOUS) ×1 IMPLANT
CLIP SUT LAPRA TY ABSORB (SUTURE) IMPLANT
CLOSURE WOUND 1/2 X4 (GAUZE/BANDAGES/DRESSINGS)
DEVICE SUTURE ENDOST 10MM (ENDOMECHANICALS) IMPLANT
DISSECTOR BLUNT TIP ENDO 5MM (MISCELLANEOUS) ×3 IMPLANT
DRAIN CHANNEL 19F RND (DRAIN) ×3 IMPLANT
DRAIN PENROSE 18X1/4 LTX STRL (WOUND CARE) IMPLANT
DRSG OPSITE POSTOP 4X6 (GAUZE/BANDAGES/DRESSINGS) ×3 IMPLANT
EVACUATOR SILICONE 100CC (DRAIN) ×3 IMPLANT
GAUZE SPONGE 4X4 12PLY STRL (GAUZE/BANDAGES/DRESSINGS) IMPLANT
GAUZE SPONGE 4X4 16PLY XRAY LF (GAUZE/BANDAGES/DRESSINGS) ×3 IMPLANT
GLOVE BIOGEL M 8.0 STRL (GLOVE) ×18 IMPLANT
GOWN SPEC L4 XLG W/TWL (GOWN DISPOSABLE) ×3 IMPLANT
GOWN STRL REUS W/TWL LRG LVL3 (GOWN DISPOSABLE) ×9 IMPLANT
GOWN STRL REUS W/TWL XL LVL3 (GOWN DISPOSABLE) IMPLANT
HANDLE STAPLE EGIA 4 XL (STAPLE) IMPLANT
KIT BASIN OR (CUSTOM PROCEDURE TRAY) ×3 IMPLANT
KIT GASTRIC LAVAGE 34FR ADT (SET/KITS/TRAYS/PACK) ×3 IMPLANT
MARKER SKIN DUAL TIP RULER LAB (MISCELLANEOUS) IMPLANT
NEEDLE SPNL 22GX3.5 QUINCKE BK (NEEDLE) IMPLANT
NS IRRIG 1000ML POUR BTL (IV SOLUTION) ×3 IMPLANT
PAD POSITIONING PINK XL (MISCELLANEOUS) ×3 IMPLANT
POSITIONER SURGICAL ARM (MISCELLANEOUS) IMPLANT
RELOAD EGIA 45 MED/THCK PURPLE (STAPLE) IMPLANT
RELOAD EGIA 45 TAN VASC (STAPLE) IMPLANT
RELOAD EGIA 60 MED/THCK PURPLE (STAPLE) IMPLANT
RELOAD EGIA 60 TAN VASC (STAPLE) IMPLANT
RELOAD ENDO STITCH 2.0 (ENDOMECHANICALS)
RTRCTR WOUND ALEXIS 18CM MED (MISCELLANEOUS) ×3
SCISSORS LAP 5X35 DISP (ENDOMECHANICALS) ×3 IMPLANT
SEALANT SURGICAL APPL DUAL CAN (MISCELLANEOUS) IMPLANT
SET IRRIG TUBING LAPAROSCOPIC (IRRIGATION / IRRIGATOR) ×3 IMPLANT
SHEARS HARMONIC ACE PLUS 45CM (MISCELLANEOUS) ×3 IMPLANT
SLEEVE ADV FIXATION 12X100MM (TROCAR) IMPLANT
SLEEVE ADV FIXATION 5X100MM (TROCAR) ×6 IMPLANT
SOLUTION ANTI FOG 6CC (MISCELLANEOUS) ×3 IMPLANT
STAPLER VISISTAT 35W (STAPLE) ×3 IMPLANT
STRIP CLOSURE SKIN 1/2X4 (GAUZE/BANDAGES/DRESSINGS) IMPLANT
SUT ETHILON 3 0 PS 1 (SUTURE) ×3 IMPLANT
SUT NOVA NAB DX-16 0-1 5-0 T12 (SUTURE) ×9 IMPLANT
SUT RELOAD ENDO STITCH 2 48X1 (ENDOMECHANICALS)
SUT RELOAD ENDO STITCH 2.0 (ENDOMECHANICALS)
SUT SILK 3 0 SH CR/8 (SUTURE) ×12 IMPLANT
SUT VIC AB 2-0 SH 27 (SUTURE) ×2
SUT VIC AB 2-0 SH 27X BRD (SUTURE) ×1 IMPLANT
SUT VIC AB 4-0 SH 18 (SUTURE) ×3 IMPLANT
SUTURE RELOAD END STTCH 2 48X1 (ENDOMECHANICALS) IMPLANT
SUTURE RELOAD ENDO STITCH 2.0 (ENDOMECHANICALS) IMPLANT
SYR 20CC LL (SYRINGE) ×3 IMPLANT
SYR 30ML LL (SYRINGE) ×3 IMPLANT
SYR 50ML LL SCALE MARK (SYRINGE) IMPLANT
TAPE CLOTH 4X10 WHT NS (GAUZE/BANDAGES/DRESSINGS) IMPLANT
TRAY FOLEY W/METER SILVER 16FR (SET/KITS/TRAYS/PACK) IMPLANT
TRAY LAPAROSCOPIC (CUSTOM PROCEDURE TRAY) ×3 IMPLANT
TROCAR ADV FIXATION 12X100MM (TROCAR) IMPLANT
TROCAR BLADELESS OPT 5 100 (ENDOMECHANICALS) ×3 IMPLANT
TROCAR XCEL 12X100 BLDLESS (ENDOMECHANICALS) ×3 IMPLANT
TUBING CONNECTING 10 (TUBING) ×4 IMPLANT
TUBING CONNECTING 10' (TUBING) ×2
TUBING ENDO SMARTCAP (MISCELLANEOUS) IMPLANT
TUBING INSUF HEATED (TUBING) ×3 IMPLANT
YANKAUER SUCT BULB TIP NO VENT (SUCTIONS) ×3 IMPLANT

## 2017-12-19 NOTE — Anesthesia Preprocedure Evaluation (Addendum)
Anesthesia Evaluation  Patient identified by MRN, date of birth, ID band Patient awake    Reviewed: Allergy & Precautions, NPO status , Patient's Chart, lab work & pertinent test results  History of Anesthesia Complications Negative for: history of anesthetic complications  Airway Mallampati: II  TM Distance: >3 FB Neck ROM: Full    Dental  (+) Edentulous Upper, Edentulous Lower   Pulmonary Current Smoker,    breath sounds clear to auscultation       Cardiovascular hypertension, Pt. on medications (-) angina Rhythm:Regular Rate:Normal     Neuro/Psych Anxiety Depression    GI/Hepatic Neg liver ROS, PUD (duodenal ulcer), GERD  Medicated and Controlled,  Endo/Other  negative endocrine ROS  Renal/GU negative Renal ROS     Musculoskeletal  (+) Fibromyalgia -  Abdominal   Peds  Hematology   Anesthesia Other Findings Breast cancer  Reproductive/Obstetrics                            Anesthesia Physical Anesthesia Plan  ASA: II  Anesthesia Plan: General   Post-op Pain Management:    Induction: Intravenous  PONV Risk Score and Plan: 3 and Ondansetron, Dexamethasone and Scopolamine patch - Pre-op  Airway Management Planned: Oral ETT  Additional Equipment:   Intra-op Plan:   Post-operative Plan: Extubation in OR  Informed Consent: I have reviewed the patients History and Physical, chart, labs and discussed the procedure including the risks, benefits and alternatives for the proposed anesthesia with the patient or authorized representative who has indicated his/her understanding and acceptance.   Dental advisory given  Plan Discussed with: CRNA and Surgeon  Anesthesia Plan Comments: (Plan routine monitors, GETA)        Anesthesia Quick Evaluation

## 2017-12-19 NOTE — Progress Notes (Addendum)
PHARMACY - ADULT TOTAL PARENTERAL NUTRITION CONSULT NOTE   Pharmacy Consult for TPN Indication: malnutrition and need to heal pylorplasty  Patient Measurements: Height: 5\' 4"  (162.6 cm) Weight: 106 lb (48.1 kg) IBW/kg (Calculated) : 54.7 TPN AdjBW (KG): 48.1 Body mass index is 18.19 kg/m. Usual Weight:  Insulin Requirements: not on insulin  Current Nutrition: NPO  IVF: D5 0.45 NS with 20 mEq/L at 100 ml/hr  Central access: pending PICC placement TPN start date: pending PICC placement  ASSESSMENT                                                                                                          HPI: Patient is a 52 y.o. 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  To start TPN post-op for malnutrition and for bowel rest to allow healing from pylorplasty.  Significant events:  - 3/29: pylorplasty  Today:   Glucose (goal <150): no hx DM;  76 with BMP on 3/26  Electrolytes: K 4.7 and Calcium wnl on 3/26   Renal: 0.72  LFTs: pending  TGs: pending  Prealbumin: pending  NUTRITIONAL GOALS                                                                                             RD recs: pending   Clinimix E 5/15 at a goal rate of 60 ml/hr + 20% fat emulsion at 20 ml/hr for 12 hrs to provide: 72 g/day protein, 1502Kcal/day.  PLAN                                                                                                                          If able to place PICC today, At 1800 today:  Start Clinimix E 5/15 at 30 ml/hr.  20% fat emulsion at 20 ml/hr for 12 hrs  Plan to advance as tolerated to the goal rate.  TPN to contain standard multivitamins and trace elements.  Reduce IVF to 70 ml/hr.  Add sensitive SSI q6h .   TPN lab panels on Mondays & Thursdays.  F/u daily.  Sariya Trickey P 12/19/2017,1:28 PM

## 2017-12-19 NOTE — Op Note (Signed)
[name]  18-Feb-1966 December 19, 2017   PCP:  Glenda Chroman, MD   Surgeon: Kaylyn Lim, MD, FACS  Asst:  P.J. Marlou Starks, MD, FACS  Anes:  general  Preop Dx: Chronic PUD with gastric outlet obstruction from prior perforated ulcer with Phillip Heal patch by Dr. Tamala Julian in [place] Dx: Same post pyloroplasty  Procedure: Laparoscopy with enterolysis, open Kocherization of duodenum, H-M type pyloroplasty with omental patch over a 71 Fr. tube Location Surgery: [facility] #1 Complications: None   EBL:   15 cc  Drains: 19 Blake drain into the right upper quadrant  Description of Procedure:  The patient was taken to OR 1 .  After anesthesia was administered and the patient was prepped a timeout was performed.  Access to the abdomen was achieved with a 5 mm Optiview through the left upper quadrant.  2 other 5 mm were placed below the umbilicus and with through those I was able to take down numerous filmy adhesions exposing the gallbladder and removing the transverse colon from this region.  Her colon had significant stool burden.  I pretty well kocherized the duodenum laparoscopically and then elected to go ahead and open and assess for pyloroplasty versus gastrojejunostomy.  Abdomen was opened through an upper midline incision without difficulty.  The stomach and pylorus were identified and palpated and the scar was very chronic dense posterior.  She almost had enough redundant duodenum to do a gastro duodenostomy but I was concerned about potential impairment of the ampulla Vater.  Elected to do a pyloroplasty and in first entered the stomach right at the pylorus and stricture with harmonic scalpel and with a Claiborne Billings in place a guide me across this strictured area into the free duodenum.  One little pouch of this area we found a large piece of either peanut or corn that was impacted in there chronically.  I opened proximally to complete the pylorus pyloric division and had a fairly eccentric opening.  I had placed  holding stitches on either side.  I then passed a 34 Ewald tube across this open area.  The back wall had scar but was otherwise intact.  The Ewald tube went into the duodenum and with that in place I then closed this.  This closure was interrupted 3-0 silk's full-thickness and I got a closure that had 2 little areas that were more like dogears were closed separately and then incorporated in more of a transverse Heineke-Mikulicz type closure.  The 34 tube was kept across this at all time it seem like it distended the area nicely and the edges of the duodenotomy came together well.  When this was completed I submerge this we blew up the stomach with a Ewald tube and we saw no bubbles.  I then removed the tube and took omentum and tacked it up over across the entire closure.  I brought in a 77 Blake drain through the left upper quadrant into the the right upper quadrant over the pyloroplasty and above the liver.  The fascia of the upper midline incision which had been protected by wound protector was then injected with 30 cc of Exparel and closed with inverted simple #1 Novafil pop offs.  Wound was irrigated with saline and closed with staples.  Patient seemed to tolerate the procedure well was taken recovery room in satisfactory condition.  An NG tube was placed prior to closure and its position noted manually.  The patient tolerated the procedure well and was taken to  the PACU in stable condition.     Matt B. Hassell Done, [place], [facility], [place]

## 2017-12-19 NOTE — Interval H&P Note (Signed)
History and Physical Interval Note:  12/19/2017 8:28 AM  [name]  has presented today for surgery, with the diagnosis of Gastric outlet obstuction  The various methods of treatment have been discussed with the patient and family. After consideration of risks, benefits and other options for treatment, the patient has consented to  Procedure(s): LAPAROSCOPY WITH POSSIBLE PYLOROPLASTY OR OPEN [place] (N/A) as a surgical intervention .  The patient's history has been reviewed, patient examined, no change in status, stable for surgery.  I have reviewed the patient's chart and labs.  Questions were answered to the patient's satisfaction.     Pedro Earls

## 2017-12-19 NOTE — Progress Notes (Signed)
PHARMACY - ADULT TOTAL PARENTERAL NUTRITION CONSULT NOTE   PICC placed and ready to use per RN. K 3.8, phos 2.0, mag 1.8  Plan: kphos 15 mM (provides 22 meq K) and mag 1 gm   Start Clinimix E 5/15 at 30 ml/hr.  20% fat emulsion at 20 ml/hr for 12 hrs  Reduce IVF to 70 ml/hr.  Add sensitive SSI q6h .   TPN lab panels on Mondays & Thursdays.  F/u daily.  Eudelia Bunch, Pharm.D. [phone] 12/19/2017 6:38 PM

## 2017-12-19 NOTE — Anesthesia Procedure Notes (Signed)
Procedure Name: Intubation Date/Time: 12/19/2017 8:51 AM Performed by: Deliah Boston, CRNA Pre-anesthesia Checklist: Patient identified, Emergency Drugs available, Suction available and Patient being monitored Patient Re-evaluated:Patient Re-evaluated prior to induction Oxygen Delivery Method: Circle system utilized Preoxygenation: Pre-oxygenation with 100% oxygen Induction Type: IV induction Ventilation: Mask ventilation without difficulty Laryngoscope Size: Mac and 3 Grade View: Grade I Tube type: Oral Tube size: 7.0 mm Number of attempts: 1 Airway Equipment and Method: Stylet and Oral airway Placement Confirmation: ETT inserted through vocal cords under direct vision,  positive ETCO2 and breath sounds checked- equal and bilateral Secured at: 20 cm Tube secured with: Tape Dental Injury: Teeth and Oropharynx as per pre-operative assessment

## 2017-12-19 NOTE — Anesthesia Postprocedure Evaluation (Signed)
Anesthesia Post Note  Patient: [name]  Procedure(s) Performed: LAPAROSCOPY, PYLOROPLASTY WITH GRAHAM PATCH (N/A )     Patient location during evaluation: PACU Anesthesia Type: General Level of consciousness: sedated, oriented and patient cooperative Pain management: pain level controlled Vital Signs Assessment: post-procedure vital signs reviewed and stable Respiratory status: spontaneous breathing, nonlabored ventilation, respiratory function stable and patient connected to nasal cannula oxygen Cardiovascular status: blood pressure returned to baseline and stable Postop Assessment: no apparent nausea or vomiting Anesthetic complications: no    Last Vitals:  Vitals:   12/19/17 1330 12/19/17 1345  BP: 113/66 110/65  Pulse: 96 (!) 105  Resp: 14 11  Temp:    SpO2: 95% 95%    Last Pain:  Vitals:   12/19/17 1400  TempSrc:   PainSc: Asleep                 Talicia Sui,E. Lee Kuang

## 2017-12-19 NOTE — Transfer of Care (Signed)
Immediate Anesthesia Transfer of Care Note  Patient: [name]  Procedure(s) Performed: Procedure(s): LAPAROSCOPY, PYLOROPLASTY WITH GRAHAM PATCH (N/A)  Patient Location: PACU  Anesthesia Type:General  Level of Consciousness: Patient easily awoken, sedated, comfortable, cooperative, following commands, responds to stimulation.   Airway & Oxygen Therapy: Patient spontaneously breathing, ventilating well, oxygen via simple oxygen mask.  Post-op Assessment: Report given to PACU RN, vital signs reviewed and stable, moving all extremities.   Post vital signs: Reviewed and stable.  Complications: No apparent anesthesia complications  Last Vitals:  Vitals Value Taken Time  BP 111/79 12/19/2017 11:38 AM  Temp    Pulse 95 12/19/2017 11:42 AM  Resp 13 12/19/2017 11:42 AM  SpO2 100 % 12/19/2017 11:42 AM  Vitals shown include unvalidated device data.  Last Pain:  Vitals:   12/19/17 0742  TempSrc:   PainSc: 2       Patients Stated Pain Goal: 3 (79/15/05 6979)  Complications: No apparent anesthesia complications

## 2017-12-19 NOTE — Progress Notes (Signed)
Peripherally Inserted Central Catheter/Midline Placement  The IV Nurse has discussed with the patient and/or persons authorized to consent for the patient, the purpose of this procedure and the potential benefits and risks involved with this procedure.  The benefits include less needle sticks, lab draws from the catheter, and the patient may be discharged home with the catheter. Risks include, but not limited to, infection, bleeding, blood clot (thrombus formation), and puncture of an artery; nerve damage and irregular heartbeat and possibility to perform a PICC exchange if needed/ordered by physician.  Alternatives to this procedure were also discussed.  Bard Power PICC patient education guide, fact sheet on infection prevention and patient information card has been provided to patient /or left at bedside.    PICC/Midline Placement Documentation        Darlyn Read 12/19/2017, 5:21 PM

## 2017-12-20 LAB — DIFFERENTIAL
BASOS ABS: 0 10*3/uL (ref 0.0–0.1)
Basophils Relative: 0 %
Eosinophils Absolute: 0 10*3/uL (ref 0.0–0.7)
Eosinophils Relative: 0 %
LYMPHS ABS: 1.2 10*3/uL (ref 0.7–4.0)
Lymphocytes Relative: 10 %
MONOS PCT: 12 %
Monocytes Absolute: 1.6 10*3/uL — ABNORMAL HIGH (ref 0.1–1.0)
NEUTROS ABS: 9.9 10*3/uL — AB (ref 1.7–7.7)
NEUTROS PCT: 78 %

## 2017-12-20 LAB — CBC
HCT: 37.5 % (ref 36.0–46.0)
HEMOGLOBIN: 12.1 g/dL (ref 12.0–15.0)
MCH: 34.1 pg — AB (ref 26.0–34.0)
MCHC: 32.3 g/dL (ref 30.0–36.0)
MCV: 105.6 fL — ABNORMAL HIGH (ref 78.0–100.0)
Platelets: 365 10*3/uL (ref 150–400)
RBC: 3.55 MIL/uL — AB (ref 3.87–5.11)
RDW: 13.6 % (ref 11.5–15.5)
WBC: 12.6 10*3/uL — AB (ref 4.0–10.5)

## 2017-12-20 LAB — COMPREHENSIVE METABOLIC PANEL
ALBUMIN: 3 g/dL — AB (ref 3.5–5.0)
ALK PHOS: 50 U/L (ref 38–126)
ALT: 30 U/L (ref 14–54)
AST: 32 U/L (ref 15–41)
Anion gap: 7 (ref 5–15)
BUN: 11 mg/dL (ref 6–20)
CALCIUM: 8.5 mg/dL — AB (ref 8.9–10.3)
CO2: 28 mmol/L (ref 22–32)
CREATININE: 0.62 mg/dL (ref 0.44–1.00)
Chloride: 107 mmol/L (ref 101–111)
GFR calc Af Amer: >60 mL/min (ref >60)
GFR calc non Af Amer: >60 mL/min (ref >60)
GLUCOSE: 144 mg/dL — AB (ref 65–99)
Potassium: 3.7 mmol/L (ref 3.5–5.1)
SODIUM: 142 mmol/L (ref 135–145)
Total Bilirubin: 0.2 mg/dL — ABNORMAL LOW (ref 0.3–1.2)
Total Protein: 6 g/dL — ABNORMAL LOW (ref 6.5–8.1)

## 2017-12-20 LAB — MAGNESIUM: Magnesium: 2 mg/dL (ref 1.7–2.4)

## 2017-12-20 LAB — PREALBUMIN: Prealbumin: 27.6 mg/dL (ref 18–38)

## 2017-12-20 LAB — GLUCOSE, CAPILLARY
GLUCOSE-CAPILLARY: 104 mg/dL — AB (ref 65–99)
GLUCOSE-CAPILLARY: 122 mg/dL — AB (ref 65–99)
Glucose-Capillary: 111 mg/dL — ABNORMAL HIGH (ref 65–99)
Glucose-Capillary: 107 mg/dL — ABNORMAL HIGH (ref 65–99)

## 2017-12-20 LAB — PHOSPHORUS: Phosphorus: 2.5 mg/dL (ref 2.5–4.6)

## 2017-12-20 LAB — TRIGLYCERIDES: Triglycerides: 96 mg/dL (ref <150)

## 2017-12-20 MED ORDER — HYDROMORPHONE 1 MG/ML IV SOLN
INTRAVENOUS | Status: DC
  Administered 2017-12-20: 10:00:00 via INTRAVENOUS
  Administered 2017-12-20: 1.8 mg via INTRAVENOUS
  Administered 2017-12-20 (×2): 0.6 mg via INTRAVENOUS
  Administered 2017-12-21: 1.4 mg via INTRAVENOUS
  Administered 2017-12-21: 2.2 mg via INTRAVENOUS
  Administered 2017-12-21: 1.6 mg via INTRAVENOUS
  Filled 2017-12-20: qty 25

## 2017-12-20 MED ORDER — METHOCARBAMOL 500 MG PO TABS: 500.0000 mg | ORAL_TABLET | Freq: Four times a day (QID) | ORAL | Status: DC | PRN

## 2017-12-20 MED ORDER — SODIUM CHLORIDE 0.9% FLUSH: 9.0000 mL | INTRAVENOUS | Status: DC | PRN

## 2017-12-20 MED ORDER — POTASSIUM PHOSPHATES 15 MMOLE/5ML IV SOLN
10.0000 mmol | Freq: Once | INTRAVENOUS | Status: AC
  Administered 2017-12-20: 10 mmol via INTRAVENOUS
  Filled 2017-12-20: qty 3.33

## 2017-12-20 MED ORDER — TRACE MINERALS CR-CU-MN-SE-ZN 10-1000-500-60 MCG/ML IV SOLN
INTRAVENOUS | Status: AC
  Administered 2017-12-20: 18:00:00 via INTRAVENOUS
  Filled 2017-12-20: qty 960

## 2017-12-20 MED ORDER — PHENOL 1.4 % MT LIQD
1.0000 | OROMUCOSAL | Status: DC | PRN
  Filled 2017-12-20: qty 177

## 2017-12-20 MED ORDER — NALOXONE HCL 0.4 MG/ML IJ SOLN: 0.4000 mg | INTRAMUSCULAR | Status: DC | PRN

## 2017-12-20 MED ORDER — KCL IN DEXTROSE-NACL 20-5-0.45 MEQ/L-%-% IV SOLN
INTRAVENOUS | Status: AC
  Administered 2017-12-21: 10:00:00 via INTRAVENOUS
  Filled 2017-12-20: qty 1000

## 2017-12-20 MED ORDER — DIPHENHYDRAMINE HCL 50 MG/ML IJ SOLN: 12.5000 mg | Freq: Four times a day (QID) | INTRAMUSCULAR | Status: DC | PRN

## 2017-12-20 MED ORDER — DIPHENHYDRAMINE HCL 12.5 MG/5ML PO ELIX: 12.5000 mg | ORAL_SOLUTION | Freq: Four times a day (QID) | ORAL | Status: DC | PRN

## 2017-12-20 MED ORDER — METHOCARBAMOL 1000 MG/10ML IJ SOLN
500.0000 mg | Freq: Four times a day (QID) | INTRAVENOUS | Status: DC | PRN
  Filled 2017-12-20: qty 5

## 2017-12-20 MED ORDER — POTASSIUM CHLORIDE 10 MEQ/100ML IV SOLN
10.0000 meq | Freq: Once | INTRAVENOUS | Status: AC
  Administered 2017-12-20: 10 meq via INTRAVENOUS
  Filled 2017-12-20: qty 100

## 2017-12-20 MED ORDER — ONDANSETRON HCL 4 MG/2ML IJ SOLN
4.0000 mg | Freq: Four times a day (QID) | INTRAMUSCULAR | Status: DC | PRN
  Filled 2017-12-20: qty 2

## 2017-12-20 MED ORDER — FAT EMULSION 20 % IV EMUL
240.0000 mL | INTRAVENOUS | Status: AC
  Administered 2017-12-20: 240 mL via INTRAVENOUS
  Filled 2017-12-20: qty 250

## 2017-12-20 MED ORDER — MORPHINE SULFATE (PF) 2 MG/ML IV SOLN
1.0000 mg | INTRAVENOUS | Status: DC | PRN
  Administered 2017-12-20 (×3): 2 mg via INTRAVENOUS
  Filled 2017-12-20 (×3): qty 1

## 2017-12-20 NOTE — Progress Notes (Signed)
1 Day Post-Op   Subjective/Chief Complaint: Pain control an issue   Objective: Vital signs in last 24 hours: Temp:  [97.4 F (36.3 C)-100 F (37.8 C)] 98.8 F (37.1 C) (03/30 0541) Pulse Rate:  [79-117] 79 (03/30 0541) Resp:  [10-30] 16 (03/30 0123) BP: (100-130)/(55-90) 130/90 (03/30 0541) SpO2:  [91 %-100 %] 98 % (03/30 0541) Weight:  [52.2 kg (115 lb 1.3 oz)] 52.2 kg (115 lb 1.3 oz) (03/30 0409) Last BM Date: 12/16/17  Intake/Output from previous day: 03/29 0701 - 03/30 0700 In: 3584.3 [I.V.:3384.3; IV Piggyback:200] Out: 0881 [Urine:450; Emesis/NG output:800; Drains:195; Blood:25] Intake/Output this shift: Total I/O In: -  Out: 150 [Emesis/NG output:150]  General appearance: alert and cooperative Resp: clear to auscultation bilaterally GI: soft, tender, nondistended. incision c/d/i. Drain serosangunous. NG functioning.  Skin: Skin color, texture, turgor normal. No rashes or lesions  Lab Results:  Recent Labs    12/19/17 1231 12/20/17 0354  WBC 19.2* 12.6*  HGB 13.8 12.1  HCT 41.6 37.5  PLT 355 365   BMET Recent Labs    12/19/17 1231 12/20/17 0354  NA 141 142  K 3.8 3.7  CL 106 107  CO2 22 28  GLUCOSE 121* 144*  BUN 16 11  CREATININE 0.72  0.72 0.62  CALCIUM 8.6* 8.5*   PT/INR No results for input(s): LABPROT, INR in the last 72 hours. ABG No results for input(s): PHART, HCO3 in the last 72 hours.  Invalid input(s): PCO2, PO2  Studies/Results: [place] Ekg Site Rite  Result Date: 12/19/2017 If Site Rite image not attached, placement could not be confirmed due to current cardiac rhythm.   Anti-infectives: Anti-infectives (From admission, onward)   Start     Dose/Rate Route Frequency Ordered Stop   12/19/17 2200  cefoTEtan (CEFOTAN) 2 g in sodium chloride 0.9 % 100 mL IVPB     2 g 200 mL/hr over 30 Minutes Intravenous Every 12 hours 12/19/17 1555 12/19/17 2256   12/19/17 0727  cefoTEtan in Dextrose 5% (CEFOTAN) IVPB 2 g     2 g Intravenous On  call to O.R. 12/19/17 1031 12/19/17 0856      Assessment/Plan: s/p Procedure(s): LAPAROSCOPY, PYLOROPLASTY WITH GRAHAM PATCH (N/A)  Laparoscopy with enterolysis, open Kocherization of duodenum, H-M type pyloroplasty with omental patch over a 34 Fr. tube 12/19/17  Continue Strict NPO, NG to suction TPN Add robaxin and dilaudid PCA for pain Ambulate pulm toilet  .  LOS: 1 day    [name] 12/20/2017

## 2017-12-20 NOTE — Progress Notes (Signed)
PHARMACY - ADULT TOTAL PARENTERAL NUTRITION CONSULT NOTE   Pharmacy Consult for TPN Indication: malnutrition and need to heal pylorplasty  Patient Measurements: Height: 5\' 4"  (162.6 cm) Weight: 115 lb 1.3 oz (52.2 kg) IBW/kg (Calculated) : 54.7 TPN AdjBW (KG): 48.1 Body mass index is 19.75 kg/m. Usual Weight:  Insulin Requirements: sSSI q6h (used 3 units since TPN started on 3/29)  Current Nutrition: NPO  IVF: D5 0.45 NS with 20 mEq/L at 70 ml/hr  Central access: PICC placed on 3/29 TPN start date: 3/29  ASSESSMENT                                                                                                          HPI: Patient is a 52 y.o. 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  To start TPN post-op for malnutrition and for bowel rest to allow healing from pylorplasty.  Significant events:  - 3/29: pylorplasty  Today:   Glucose (goal <150): no hx DM; one CBG >200  Electrolytes: K 3.7, phos up 2.5 (s/p potassium phosphate 15 mmol on 3/29); CorrCa and mag wnl  Renal: scr wnl  LFTs: wnl  TGs: 96 (3/30)  Prealbumin: 27.6 (3/30)  NUTRITIONAL GOALS                                                                                             RD recs: pending   Clinimix E 5/15 at a goal rate of 60 ml/hr + 20% fat emulsion at 20 ml/hr for 12 hrs to provide: 72 g/day protein, 1502Kcal/day.  PLAN                                                                                                                          Now: - potassium chloride 10 mEq IV x 1 - potassium phosphate 10 mmol IV x1   At 1800 today:  Increase Clinimix E 5/15 at 40 ml/hr.  20% fat emulsion at 20 ml/hr for 12 hrs  Plan to advance as tolerated to the goal rate.  TPN to contain standard multivitamins and trace elements.  Reduce IVF to  60 ml/hr.  continue sensitive SSI q6h .   TPN lab panels on Mondays & Thursdays.  F/u  daily.  Georgeanne Frankland P 12/20/2017,7:05 AM

## 2017-12-20 NOTE — Progress Notes (Signed)
rm [place], s/p Lap Enterolysis Open Kocherization of duodednum, 3/29. C/o severe pain q1Hr Fentanyl12.5mg   is Not helping pain Notified Dr. Lucia Gaskins, Morphine ordered to alleviate pain.  Patient stated that she does not react to taking morphine and has had it before.

## 2017-12-21 LAB — BASIC METABOLIC PANEL
ANION GAP: 8 (ref 5–15)
BUN: 11 mg/dL (ref 6–20)
CALCIUM: 8.9 mg/dL (ref 8.9–10.3)
CO2: 28 mmol/L (ref 22–32)
Chloride: 104 mmol/L (ref 101–111)
Creatinine, Ser: 0.47 mg/dL (ref 0.44–1.00)
GFR calc Af Amer: >60 mL/min (ref >60)
GFR calc non Af Amer: >60 mL/min (ref >60)
GLUCOSE: 111 mg/dL — AB (ref 65–99)
Potassium: 3.9 mmol/L (ref 3.5–5.1)
SODIUM: 140 mmol/L (ref 135–145)

## 2017-12-21 LAB — CBC
HCT: 37.2 % (ref 36.0–46.0)
Hemoglobin: 11.9 g/dL — ABNORMAL LOW (ref 12.0–15.0)
MCH: 34.1 pg — ABNORMAL HIGH (ref 26.0–34.0)
MCHC: 32 g/dL (ref 30.0–36.0)
MCV: 106.6 fL — AB (ref 78.0–100.0)
PLATELETS: 338 10*3/uL (ref 150–400)
RBC: 3.49 MIL/uL — AB (ref 3.87–5.11)
RDW: 13.8 % (ref 11.5–15.5)
WBC: 11.3 10*3/uL — AB (ref 4.0–10.5)

## 2017-12-21 LAB — GLUCOSE, CAPILLARY
GLUCOSE-CAPILLARY: 112 mg/dL — AB (ref 65–99)
Glucose-Capillary: 116 mg/dL — ABNORMAL HIGH (ref 65–99)
Glucose-Capillary: 119 mg/dL — ABNORMAL HIGH (ref 65–99)
Glucose-Capillary: 134 mg/dL — ABNORMAL HIGH (ref 65–99)

## 2017-12-21 MED ORDER — TRACE MINERALS CR-CU-MN-SE-ZN 10-1000-500-60 MCG/ML IV SOLN
INTRAVENOUS | Status: AC
  Administered 2017-12-21: 18:00:00 via INTRAVENOUS
  Filled 2017-12-21: qty 1440

## 2017-12-21 MED ORDER — LIP MEDEX EX OINT
TOPICAL_OINTMENT | CUTANEOUS | Status: AC
  Administered 2017-12-21
  Filled 2017-12-21: qty 7

## 2017-12-21 MED ORDER — ORAL CARE MOUTH RINSE
15.0000 mL | Freq: Two times a day (BID) | OROMUCOSAL | Status: DC
  Administered 2017-12-21 – 2017-12-26 (×11): 15 mL via OROMUCOSAL

## 2017-12-21 MED ORDER — HYDROMORPHONE 1 MG/ML IV SOLN
INTRAVENOUS | Status: DC
  Administered 2017-12-21 (×2): 1.5 mg via INTRAVENOUS
  Administered 2017-12-21: 3 mg via INTRAVENOUS
  Administered 2017-12-21: 1.5 mg via INTRAVENOUS
  Administered 2017-12-22 (×2): 2.5 mg via INTRAVENOUS
  Administered 2017-12-22: 02:00:00 via INTRAVENOUS
  Administered 2017-12-22 (×2): 2.5 mg via INTRAVENOUS
  Administered 2017-12-22: 3 mg via INTRAVENOUS
  Administered 2017-12-23: 4.5 mg via INTRAVENOUS
  Administered 2017-12-23 (×2): 1.5 mg via INTRAVENOUS
  Administered 2017-12-23: 1 mg via INTRAVENOUS
  Administered 2017-12-23: 3.5 mg via INTRAVENOUS
  Administered 2017-12-23: 2.5 mg via INTRAVENOUS
  Administered 2017-12-23: 5 mg via INTRAVENOUS
  Administered 2017-12-24: 2.5 mg via INTRAVENOUS
  Administered 2017-12-24: 1.5 mg via INTRAVENOUS
  Administered 2017-12-24: 1 mg via INTRAVENOUS
  Administered 2017-12-24: 3.5 mg via INTRAVENOUS
  Administered 2017-12-24: 1 mg via INTRAVENOUS
  Administered 2017-12-25: 2.5 mg via INTRAVENOUS
  Administered 2017-12-25: 3.5 mg via INTRAVENOUS
  Administered 2017-12-25: 3 mg via INTRAVENOUS
  Administered 2017-12-25: 2 mg via INTRAVENOUS
  Administered 2017-12-25 (×2): 2.5 mg via INTRAVENOUS
  Administered 2017-12-25: 07:00:00 via INTRAVENOUS
  Administered 2017-12-26: 2 mg via INTRAVENOUS
  Administered 2017-12-26: 3 mg via INTRAVENOUS
  Administered 2017-12-26: 1.5 mg via INTRAVENOUS
  Filled 2017-12-21 (×3): qty 25

## 2017-12-21 MED ORDER — ACETAMINOPHEN 10 MG/ML IV SOLN
1000.0000 mg | Freq: Four times a day (QID) | INTRAVENOUS | Status: AC
  Administered 2017-12-21 – 2017-12-22 (×4): 1000 mg via INTRAVENOUS
  Filled 2017-12-21 (×4): qty 100

## 2017-12-21 MED ORDER — FAT EMULSION 20 % IV EMUL
240.0000 mL | INTRAVENOUS | Status: AC
  Administered 2017-12-21: 240 mL via INTRAVENOUS
  Filled 2017-12-21: qty 250

## 2017-12-21 MED ORDER — KCL IN DEXTROSE-NACL 20-5-0.45 MEQ/L-%-% IV SOLN
INTRAVENOUS | Status: DC
  Administered 2017-12-22 – 2017-12-23 (×2): via INTRAVENOUS
  Administered 2017-12-24 – 2017-12-25 (×2): 1000 mL via INTRAVENOUS
  Filled 2017-12-21 (×5): qty 1000

## 2017-12-21 NOTE — Progress Notes (Signed)
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  GOAL:   Patient will meet greater than or equal to 90% of their needs  MONITOR:   Diet advancement, Labs, Weight trends, I & O's(TPN)  REASON FOR ASSESSMENT:   Consult New TPN/TNA  ASSESSMENT:    52 year old female who presents with non-malignant abdominal pain.  3/29: s/p Laparoscopy with enterolysis, open Kocherization of duodenum, H-M type pyloroplasty with omental patch over a 34 Fr. tube  Pt in room with NGT in place for suction, current output in room ~500 ml. Pt reports she is ready to have tube removed. Pt has been tolerating TPN well over the weekend. PICC was placed 3/29. Currently running: Clinimix E 5/15 at 60 ml/hr + 20% fat emulsion at 20 ml/hr for 12 hrs (providing 1502 kcal and 72g protein, which meets estimated needs below).  Pt reports PTA, she was unable to tolerate much food. She would try foods such as crackers, broth, soft eggs. States she subsisted mainly on 4 HP Boosts a day (total of 960 kcal and 60g protein). Pt states since May 2018, food has had a hard time going down and she would feel like it was stuck.   Pt reports UBW of 115 lb (which is what she weighed on 3/30). Suspect this is fluid gain, she did weigh 106 lb on 3/26. In May 2018, pt weighed 109 lb. Insignificant weight loss but do not believe pt has had true weight gain.  Medications reviewed.  Labs reviewed: CBGs: 104-112 Mg/Phos WNL  TG: 96 mg/dL  NUTRITION - FOCUSED PHYSICAL EXAM:    Most Recent Value  Orbital Region  Mild depletion  Upper Arm Region  Mild  depletion  Thoracic and Lumbar Region  Unable to assess  Buccal Region  No depletion  Temple Region  Moderate depletion  Clavicle Bone Region  Mild depletion  Clavicle and Acromion Bone Region  Mild depletion  Scapular Bone Region  Unable to assess  Dorsal Hand  No depletion  Patellar Region  Unable to assess  Anterior Thigh Region  Unable to assess  Posterior Calf Region  Unable to assess  Edema (RD Assessment)  None       Diet Order:  Diet NPO time specified TPN (CLINIMIX-E) Adult .TPN (CLINIMIX-E) Adult  EDUCATION NEEDS:   Education needs have been addressed  Skin:  Skin Assessment: Reviewed RN Assessment  Last BM:  3/26  Height:   Ht Readings from Last 1 Encounters:  12/19/17 5\' 4"  (1.626 m)    Weight:   Wt Readings from Last 1 Encounters:  12/20/17 115 lb 1.3 oz (52.2 kg)    Ideal Body Weight:  54.5 kg  BMI:  Body mass index is 19.75 kg/m.  Estimated Nutritional Needs:   Kcal:  6578-4696  Protein:  70-80g  Fluid:  1.7L/day  Clayton Bibles, MS, RD, LDN [place] Dietitian Pager: [phone] After Hours Pager: [phone]

## 2017-12-21 NOTE — Progress Notes (Signed)
2 Days Post-Op   Subjective/Chief Complaint: Pain control continues to be an issue. NG is hurting her throat. Has been OOB to chair.    Objective: Vital signs in last 24 hours: Temp:  [98.4 F (36.9 C)-99.3 F (37.4 C)] 99.3 F (37.4 C) (03/31 0422) Pulse Rate:  [72-88] 72 (03/31 0422) Resp:  [12-22] 15 (03/31 0326) BP: (122-125)/(77-90) 125/81 (03/31 0422) SpO2:  [92 %-98 %] 96 % (03/31 0422) Last BM Date: 12/16/17  Intake/Output from previous day: 03/30 0701 - 03/31 0700 In: 2850 [I.V.:2221.7; NG/GT:375; IV Piggyback:253.3] Out: 2581 [Urine:1560; Emesis/NG output:950; Drains:71] Intake/Output this shift: No intake/output data recorded.  General appearance: alert and cooperative Resp: clear to auscultation bilaterally GI: soft, tender, nondistended. incision c/d/i. Drain serosangunous. NG functioning.  Skin: Skin color, texture, turgor normal. No rashes or lesions  Lab Results:  Recent Labs    12/20/17 0354 12/21/17 0412  WBC 12.6* 11.3*  HGB 12.1 11.9*  HCT 37.5 37.2  PLT 365 338   BMET Recent Labs    12/20/17 0354 12/21/17 0412  NA 142 140  K 3.7 3.9  CL 107 104  CO2 28 28  GLUCOSE 144* 111*  BUN 11 11  CREATININE 0.62 0.47  CALCIUM 8.5* 8.9   PT/INR No results for input(s): LABPROT, INR in the last 72 hours. ABG No results for input(s): PHART, HCO3 in the last 72 hours.  Invalid input(s): PCO2, PO2  Studies/Results: [place] Ekg Site Rite  Result Date: 12/19/2017 If Site Rite image not attached, placement could not be confirmed due to current cardiac rhythm.   Anti-infectives: Anti-infectives (From admission, onward)   Start     Dose/Rate Route Frequency Ordered Stop   12/19/17 2200  cefoTEtan (CEFOTAN) 2 g in sodium chloride 0.9 % 100 mL IVPB     2 g 200 mL/hr over 30 Minutes Intravenous Every 12 hours 12/19/17 1555 12/19/17 2256   12/19/17 0727  cefoTEtan in Dextrose 5% (CEFOTAN) IVPB 2 g     2 g Intravenous On call to O.R. 12/19/17 0388  12/19/17 0856      Assessment/Plan: s/p Procedure(s): LAPAROSCOPY, PYLOROPLASTY WITH GRAHAM PATCH (N/A)  Laparoscopy with enterolysis, open Kocherization of duodenum, H-M type pyloroplasty with omental patch over a 34 Fr. tube 12/19/17  Continue Strict NPO, NG to suction TPN Increase PCA dose Ambulate pulm toilet  Likely will get UGI around POD 5-7 before removing ng. .  LOS: 2 days    Clovis Riley 12/21/2017

## 2017-12-21 NOTE — Progress Notes (Signed)
PHARMACY - ADULT TOTAL PARENTERAL NUTRITION CONSULT NOTE   Pharmacy Consult for TPN Indication: malnutrition and need to heal pylorplasty  Patient Measurements: Height: 5\' 4"  (162.6 cm) Weight: 115 lb 1.3 oz (52.2 kg) IBW/kg (Calculated) : 54.7 TPN AdjBW (KG): 48.1 Body mass index is 19.75 kg/m. Usual Weight:  Insulin Requirements: sSSI q6h (1 unit since rate increased to 40 ml/hr on 3/30)  Current Nutrition: NPO  IVF: D5 0.45 NS with 20 mEq/L at 60 ml/hr  Central access: PICC placed on 3/29 TPN start date: 3/29  ASSESSMENT                                                                                                          HPI: Patient is a 52 y.o. 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  To start TPN post-op for malnutrition and for bowel rest to allow healing from pylorplasty.  Significant events:  - 3/29: pylorplasty  Today:   Glucose (goal <150): no hx DM; cbgs at goal  Electrolytes: K 3.9, CorrCa wnl  Renal: scr wnl  LFTs: wnl  TGs: 96 (3/30)  Prealbumin: 27.6 (3/30)  NG output: 950 mL  NUTRITIONAL GOALS                                                                                             RD recs: pending   Clinimix E 5/15 at a goal rate of 60 ml/hr + 20% fat emulsion at 20 ml/hr for 12 hrs to provide: 72 g/day protein, 1502Kcal/day.  PLAN                                                                                                                           At 1800 today:  Increase Clinimix E 5/15 at 60 ml/hr.  20% fat emulsion at 20 ml/hr for 12 hrs  Plan to advance as tolerated to the goal rate.  TPN to contain standard multivitamins and trace elements.  Reduce IVF to 40 ml/hr.  continue sensitive SSI q6h .   TPN lab panels on Mondays & Thursdays.  F/u daily.  Devin Going,  Leavy Heatherly P 12/21/2017,9:18 AM

## 2017-12-22 ENCOUNTER — Encounter (HOSPITAL_COMMUNITY): Payer: Self-pay | Admitting: Surgery

## 2017-12-22 ENCOUNTER — Inpatient Hospital Stay (HOSPITAL_COMMUNITY): Payer: Medicaid Other

## 2017-12-22 LAB — COMPREHENSIVE METABOLIC PANEL
ALK PHOS: 57 U/L (ref 38–126)
ALT: 18 U/L (ref 14–54)
AST: 17 U/L (ref 15–41)
Albumin: 2.9 g/dL — ABNORMAL LOW (ref 3.5–5.0)
Anion gap: 8 (ref 5–15)
BUN: 15 mg/dL (ref 6–20)
CALCIUM: 9 mg/dL (ref 8.9–10.3)
CO2: 29 mmol/L (ref 22–32)
Chloride: 100 mmol/L — ABNORMAL LOW (ref 101–111)
Creatinine, Ser: 0.51 mg/dL (ref 0.44–1.00)
GLUCOSE: 111 mg/dL — AB (ref 65–99)
POTASSIUM: 3.6 mmol/L (ref 3.5–5.1)
Sodium: 137 mmol/L (ref 135–145)
TOTAL PROTEIN: 6.4 g/dL — AB (ref 6.5–8.1)

## 2017-12-22 LAB — CBC
HCT: 37.2 % (ref 36.0–46.0)
Hemoglobin: 12.4 g/dL (ref 12.0–15.0)
MCH: 35 pg — ABNORMAL HIGH (ref 26.0–34.0)
MCHC: 33.3 g/dL (ref 30.0–36.0)
MCV: 105.1 fL — AB (ref 78.0–100.0)
PLATELETS: 346 10*3/uL (ref 150–400)
RBC: 3.54 MIL/uL — AB (ref 3.87–5.11)
RDW: 13.1 % (ref 11.5–15.5)
WBC: 14.3 10*3/uL — AB (ref 4.0–10.5)

## 2017-12-22 LAB — DIFFERENTIAL
BASOS ABS: 0 10*3/uL (ref 0.0–0.1)
BASOS PCT: 0 %
Eosinophils Absolute: 0.2 10*3/uL (ref 0.0–0.7)
Eosinophils Relative: 1 %
LYMPHS PCT: 8 %
Lymphs Abs: 1.1 10*3/uL (ref 0.7–4.0)
Monocytes Absolute: 1.4 10*3/uL — ABNORMAL HIGH (ref 0.1–1.0)
Monocytes Relative: 10 %
NEUTROS ABS: 11.5 10*3/uL — AB (ref 1.7–7.7)
Neutrophils Relative %: 81 %

## 2017-12-22 LAB — GLUCOSE, CAPILLARY
GLUCOSE-CAPILLARY: 117 mg/dL — AB (ref 65–99)
GLUCOSE-CAPILLARY: 119 mg/dL — AB (ref 65–99)
Glucose-Capillary: 122 mg/dL — ABNORMAL HIGH (ref 65–99)
Glucose-Capillary: 127 mg/dL — ABNORMAL HIGH (ref 65–99)

## 2017-12-22 LAB — PHOSPHORUS: PHOSPHORUS: 4.1 mg/dL (ref 2.5–4.6)

## 2017-12-22 LAB — TRIGLYCERIDES: TRIGLYCERIDES: 124 mg/dL (ref <150)

## 2017-12-22 LAB — PREALBUMIN: Prealbumin: 23.4 mg/dL (ref 18–38)

## 2017-12-22 LAB — MAGNESIUM: MAGNESIUM: 1.8 mg/dL (ref 1.7–2.4)

## 2017-12-22 MED ORDER — TRACE MINERALS CR-CU-MN-SE-ZN 10-1000-500-60 MCG/ML IV SOLN
INTRAVENOUS | Status: AC
  Administered 2017-12-22: 17:00:00 via INTRAVENOUS
  Filled 2017-12-22: qty 1440

## 2017-12-22 MED ORDER — LORAZEPAM 2 MG/ML IJ SOLN
0.5000 mg | Freq: Four times a day (QID) | INTRAMUSCULAR | Status: DC | PRN
  Administered 2017-12-22 – 2017-12-23 (×3): 0.5 mg via INTRAVENOUS
  Filled 2017-12-22 (×3): qty 1

## 2017-12-22 MED ORDER — IOPAMIDOL (ISOVUE-300) INJECTION 61%
INTRAVENOUS | Status: AC
  Administered 2017-12-22: 150 mL via NASOGASTRIC
  Filled 2017-12-22: qty 50

## 2017-12-22 MED ORDER — POTASSIUM CHLORIDE 10 MEQ/100ML IV SOLN
10.0000 meq | INTRAVENOUS | Status: AC
  Administered 2017-12-22 (×2): 10 meq via INTRAVENOUS
  Filled 2017-12-22 (×2): qty 100

## 2017-12-22 MED ORDER — MAGNESIUM SULFATE 2 GM/50ML IV SOLN
2.0000 g | Freq: Once | INTRAVENOUS | Status: AC
  Administered 2017-12-22: 2 g via INTRAVENOUS
  Filled 2017-12-22: qty 50

## 2017-12-22 MED ORDER — IOPAMIDOL (ISOVUE-300) INJECTION 61%
INTRAVENOUS | Status: AC
  Filled 2017-12-22: qty 50

## 2017-12-22 MED ORDER — FAT EMULSION 20 % IV EMUL
240.0000 mL | INTRAVENOUS | Status: AC
  Administered 2017-12-22: 240 mL via INTRAVENOUS
  Filled 2017-12-22: qty 250

## 2017-12-22 NOTE — Progress Notes (Signed)
Dr Earlie Server office called regarding pt having uncontrolled pain. They will have a PA call me back.

## 2017-12-22 NOTE — Progress Notes (Signed)
Patient was complaining of pain in her throat. Her HR is 120. Patient's family wanted RN to page MD about the pain her throat. Patient was informed that pain her throat is normal due to the NG tube. MD Gerkin was paged.

## 2017-12-22 NOTE — Progress Notes (Signed)
Dr Hassell Done paged regarding patient crying and emotionally upset because of severe pain from NG tube. Donne Hazel, RN

## 2017-12-22 NOTE — Progress Notes (Signed)
3 Days Post-Op   Subjective/Chief Complaint: Complains of sore throat from ng   Objective: Vital signs in last 24 hours: Temp:  [98.2 F (36.8 C)-99.5 F (37.5 C)] 98.3 F (36.8 C) (04/01 1000) Pulse Rate:  [73-93] 93 (04/01 1000) Resp:  [14-23] 15 (04/01 1217) BP: (116-130)/(76-84) 119/82 (04/01 1000) SpO2:  [90 %-99 %] 90 % (04/01 1217) Last BM Date: 12/16/17  Intake/Output from previous day: 03/31 0701 - 04/01 0700 In: 2929 [I.V.:2629; IV Piggyback:300] Out: 6301 [Urine:2400; Emesis/NG output:600; Drains:75] Intake/Output this shift: Total I/O In: -  Out: 1005 [Urine:200; Emesis/NG output:800; Drains:5]  General appearance: alert and cooperative Resp: clear to auscultation bilaterally Cardio: regular rate and rhythm GI: soft, mild tenderness. incision looks good. quiet  Lab Results:  Recent Labs    12/21/17 0412 12/22/17 0346  WBC 11.3* 14.3*  HGB 11.9* 12.4  HCT 37.2 37.2  PLT 338 346   BMET Recent Labs    12/21/17 0412 12/22/17 0346  NA 140 137  K 3.9 3.6  CL 104 100*  CO2 28 29  GLUCOSE 111* 111*  BUN 11 15  CREATININE 0.47 0.51  CALCIUM 8.9 9.0   PT/INR No results for input(s): LABPROT, INR in the last 72 hours. ABG No results for input(s): PHART, HCO3 in the last 72 hours.  Invalid input(s): PCO2, PO2  Studies/Results: No results found.  Anti-infectives: Anti-infectives (From admission, onward)   Start     Dose/Rate Route Frequency Ordered Stop   12/19/17 2200  cefoTEtan (CEFOTAN) 2 g in sodium chloride 0.9 % 100 mL IVPB     2 g 200 mL/hr over 30 Minutes Intravenous Every 12 hours 12/19/17 1555 12/19/17 2256   12/19/17 0727  cefoTEtan in Dextrose 5% (CEFOTAN) IVPB 2 g     2 g Intravenous On call to O.R. 12/19/17 6010 12/19/17 0856      Assessment/Plan: s/p Procedure(s): LAPAROSCOPY, PYLOROPLASTY WITH GRAHAM PATCH (N/A) Continue tpn for nutrition  Pod 3 pyloroplasty Will get UGI today to rule out leak May have some ice  chips ambulate  LOS: 3 days    TOTH III,PAUL S 12/22/2017

## 2017-12-22 NOTE — Progress Notes (Signed)
PHARMACY - ADULT TOTAL PARENTERAL NUTRITION CONSULT NOTE   Pharmacy Consult for TPN Indication: malnutrition and need to heal pylorplasty  Patient Measurements: Height: 5\' 4"  (162.6 cm) Weight: 115 lb 1.3 oz (52.2 kg) IBW/kg (Calculated) : 54.7 TPN AdjBW (KG): 48.1 Body mass index is 19.75 kg/m. Usual Weight:  Insulin Requirements: sSSI q6h (0 unit since over the last 24 hours)   Current Nutrition: NPO  IVF: D5 0.45 NS with 20 mEq/L at 40 ml/hr  Central access: PICC placed on 3/29 TPN start date: 3/29  ASSESSMENT                                                                                                          HPI: Patient is a 52 y.o. 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  To start TPN post-op for malnutrition and for bowel rest to allow healing from pylorplasty.  Significant events:  - 3/29: pylorplasty  Today:   Glucose (goal <150): no hx DM; cbgs at goal  Electrolytes: K 3.6, CorrCa wnl, Magnesium 1.8 low but at the low end of normal, others WNL  Renal: scr wnl  LFTs: wnl  TGs: 96 (3/30), 124 ( 4/1)   Prealbumin: 27.6 (3/30)  NG output: 600 mL  NUTRITIONAL GOALS                                                                                             RD recs: 3/31   Kcal:  1450-1650 Protein:  70-80g Fluid:  1.7L/day   Clinimix E 5/15 at a goal rate of 60 ml/hr + 20% fat emulsion at 20 ml/hr for 12 hrs to provide: 72 g/day protein, 1502Kcal/day.  PLAN                                                                                                                          Potassium Chloride 10 meq IV x2,  Magnesium sulfate 2 gr IV x1   At 1800 today:  Continue Clinimix E 5/15 at 60 ml/hr, the target rate   20% fat emulsion at 20 ml/hr for 12 hrs  Maintain as tolerated at the goal rate.  TPN to contain standard multivitamins and trace elements.  Continue IVF at 40  ml/hr.  Continue sensitive SSI q6h .   BMP, phosphorus, magnesium with AM labs   TPN lab panels on Mondays & Thursdays.  F/u daily.   Royetta Asal, PharmD, BCPS Pager [phone] 12/22/2017 8:46 AM

## 2017-12-23 DIAGNOSIS — E440 Moderate protein-calorie malnutrition: Secondary | ICD-10-CM

## 2017-12-23 LAB — GLUCOSE, CAPILLARY
GLUCOSE-CAPILLARY: 126 mg/dL — AB (ref 65–99)
GLUCOSE-CAPILLARY: 110 mg/dL — AB (ref 65–99)
GLUCOSE-CAPILLARY: 111 mg/dL — AB (ref 65–99)

## 2017-12-23 LAB — BASIC METABOLIC PANEL
ANION GAP: 7 (ref 5–15)
BUN: 16 mg/dL (ref 6–20)
CO2: 28 mmol/L (ref 22–32)
Calcium: 8.5 mg/dL — ABNORMAL LOW (ref 8.9–10.3)
Chloride: 102 mmol/L (ref 101–111)
Creatinine, Ser: 0.5 mg/dL (ref 0.44–1.00)
GFR calc Af Amer: >60 mL/min (ref >60)
GLUCOSE: 130 mg/dL — AB (ref 65–99)
POTASSIUM: 4 mmol/L (ref 3.5–5.1)
Sodium: 137 mmol/L (ref 135–145)

## 2017-12-23 LAB — MAGNESIUM: Magnesium: 2 mg/dL (ref 1.7–2.4)

## 2017-12-23 LAB — PHOSPHORUS: PHOSPHORUS: 3.4 mg/dL (ref 2.5–4.6)

## 2017-12-23 MED ORDER — SODIUM CHLORIDE 0.9 % IV BOLUS
500.0000 mL | Freq: Once | INTRAVENOUS | Status: AC
  Administered 2017-12-23: 500 mL via INTRAVENOUS

## 2017-12-23 MED ORDER — PROMETHAZINE HCL 25 MG/ML IJ SOLN: 12.5000 mg | Freq: Four times a day (QID) | INTRAMUSCULAR | Status: DC | PRN

## 2017-12-23 MED ORDER — TRACE MINERALS CR-CU-MN-SE-ZN 10-1000-500-60 MCG/ML IV SOLN
INTRAVENOUS | Status: AC
  Administered 2017-12-23: 18:00:00 via INTRAVENOUS
  Filled 2017-12-23: qty 1440

## 2017-12-23 MED ORDER — FAT EMULSION 20 % IV EMUL
240.0000 mL | INTRAVENOUS | Status: AC
  Administered 2017-12-23: 240 mL via INTRAVENOUS
  Filled 2017-12-23: qty 250

## 2017-12-23 NOTE — Progress Notes (Signed)
PHARMACY - ADULT TOTAL PARENTERAL NUTRITION CONSULT NOTE   Pharmacy Consult for TPN Indication: malnutrition and need to heal pylorplasty  Patient Measurements: Height: 5\' 4"  (162.6 cm) Weight: 115 lb 1.3 oz (52.2 kg) IBW/kg (Calculated) : 54.7 TPN AdjBW (KG): 48.1 Body mass index is 19.75 kg/m. Usual Weight:  Insulin Requirements: sSSI q6h (3 unit since over the last 24 hours)   Current Nutrition: NPO  IVF: D5 0.45 NS with 20 mEq/L at 40 ml/hr  Central access: PICC placed on 3/29 TPN start date: 3/29 4/2 DC NG tube   ASSESSMENT                                                                                                          HPI: Patient is a 52 y.o. 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  To start TPN post-op for malnutrition and for bowel rest to allow healing from pylorplasty.  Significant events:  - 3/29: pylorplasty  Today:   Glucose (goal <150): no hx DM; cbgs at goal  Electrolytes:  CorrCa wnl, all others WNL  Renal: scr wnl, SCr 0.50  LFTs: wnl ( 4/1)   TGs: 96 (3/30), 124 ( 4/1)   Prealbumin: 27.6 (3/30)  NG output: 600 mL  NUTRITIONAL GOALS                                                                                             RD recs: 3/31   Kcal:  1450-1650 Protein:  70-80g Fluid:  1.7L/day   Clinimix E 5/15 at a goal rate of 60 ml/hr + 20% fat emulsion at 20 ml/hr for 12 hrs to provide: 72 g/day protein, 1502Kcal/day.  PLAN                                                                                                                           At 1800 today:  Continue Clinimix E 5/15 at 60 ml/hr, the target rate   20% fat emulsion at 20 ml/hr for 12 hrs  Maintain as tolerated at the goal rate.  TPN to contain standard multivitamins and  trace elements.  Continue IVF at 40 ml/hr.  Continue sensitive SSI q6h .   BMP, phosphorus, magnesium with AM labs    TPN lab panels on Mondays & Thursdays.  F/u daily.   Royetta Asal, PharmD, BCPS Pager [phone] 12/23/2017 8:32 AM

## 2017-12-23 NOTE — Progress Notes (Signed)
Patient's sister said several times that that the NG tube wasn't suctioning into the canister. Two different RNs checked the NG tube and it is suctioning and the RNs explained to the sister that it is set to intermittent suctioning.

## 2017-12-23 NOTE — Progress Notes (Signed)
Dr Harlow Asa gave a verbal order to give patient a bolus of NS 5108mL.

## 2017-12-23 NOTE — Progress Notes (Signed)
4 Days Post-Op   Subjective/Chief Complaint: Complains of sore throat. Ng looks almost out. UGI shows no leak   Objective: Vital signs in last 24 hours: Temp:  [98 F (36.7 C)-100.1 F (37.8 C)] 98 F (36.7 C) (04/02 7412) Pulse Rate:  [93-111] 104 (04/02 0633) Resp:  [10-23] 10 (04/02 0806) BP: (112-127)/(74-82) 112/82 (04/02 8786) SpO2:  [90 %-97 %] 91 % (04/02 0806) Last BM Date: 12/16/17  Intake/Output from previous day: 04/01 0701 - 04/02 0700 In: 3161.6 [I.V.:2596; IV Piggyback:565.6] Out: 2835 [Urine:1550; Emesis/NG output:1250; Drains:35] Intake/Output this shift: No intake/output data recorded.  General appearance: alert and cooperative Resp: rhonchi bilaterally Cardio: regular rate and rhythm GI: soft, mild tenderness. good bs. incision ok. drain output serous  Lab Results:  Recent Labs    12/21/17 0412 12/22/17 0346  WBC 11.3* 14.3*  HGB 11.9* 12.4  HCT 37.2 37.2  PLT 338 346   BMET Recent Labs    12/22/17 0346 12/23/17 0430  NA 137 137  K 3.6 4.0  CL 100* 102  CO2 29 28  GLUCOSE 111* 130*  BUN 15 16  CREATININE 0.51 0.50  CALCIUM 9.0 8.5*   PT/INR No results for input(s): LABPROT, INR in the last 72 hours. ABG No results for input(s): PHART, HCO3 in the last 72 hours.  Invalid input(s): PCO2, PO2  Studies/Results: Dg Ugi W/water Sol Cm  Result Date: 12/22/2017 CLINICAL DATA:  Recent gastric outlet obstruction treated with pyloroplasty. EXAM: WATER SOLUBLE UPPER GI SERIES TECHNIQUE: Single-column upper GI series was performed using water soluble contrast. CONTRAST:  Water-soluble contrast COMPARISON:  Upper GI dated 11/19/2017 FLUOROSCOPY TIME:  Fluoroscopy Time: 2.9 minutes Radiation Exposure Index (if provided by the fluoroscopic device): 47.9 mGy FINDINGS: Water-soluble contrast was instilled through the indwelling NG tube. The patient was placed in a semi-erect position and then turned into left and right lateral decubitus positions in  semi erect and supine position. The water-soluble contrast slowly passed through the distorted pylorus and duodenal bulb into the duodenum. There is some mucosal edema or scarring in that area. However, contrast does pass into the nondistended duodenum. No evidence of contrast extravasation. At the completion of the procedure the majority of the contrast was aspirated from the stomach through the NG tube. Note is made of what appears to be atypical atelectasis at the left lung base posterior medially. IMPRESSION: 1. No evidence of leakage from the site of pyloroplasty. There is some postsurgical edema as well as chronic scarring at the pylorus. 2. Atelectasis at the left lung base posterior medially. Electronically Signed   By: Lorriane Shire M.D.   On: 12/22/2017 15:56    Anti-infectives: Anti-infectives (From admission, onward)   Start     Dose/Rate Route Frequency Ordered Stop   12/19/17 2200  cefoTEtan (CEFOTAN) 2 g in sodium chloride 0.9 % 100 mL IVPB     2 g 200 mL/hr over 30 Minutes Intravenous Every 12 hours 12/19/17 1555 12/19/17 2256   12/19/17 0727  cefoTEtan in Dextrose 5% (CEFOTAN) IVPB 2 g     2 g Intravenous On call to O.R. 12/19/17 0727 12/19/17 0856      Assessment/Plan: s/p Procedure(s): LAPAROSCOPY, PYLOROPLASTY WITH GRAHAM PATCH (N/A) d/c ng. allow ice chips  Pod 4 ambulate  LOS: 4 days    TOTH III,PAUL S 12/23/2017

## 2017-12-24 ENCOUNTER — Inpatient Hospital Stay (HOSPITAL_COMMUNITY): Payer: Medicaid Other

## 2017-12-24 LAB — CBC WITH DIFFERENTIAL/PLATELET
BASOS ABS: 0 10*3/uL (ref 0.0–0.1)
Basophils Relative: 0 %
EOS ABS: 0.2 10*3/uL (ref 0.0–0.7)
EOS PCT: 2 %
HCT: 33.9 % — ABNORMAL LOW (ref 36.0–46.0)
Hemoglobin: 10.9 g/dL — ABNORMAL LOW (ref 12.0–15.0)
Lymphocytes Relative: 5 %
Lymphs Abs: 0.7 10*3/uL (ref 0.7–4.0)
MCH: 33.9 pg (ref 26.0–34.0)
MCHC: 32.2 g/dL (ref 30.0–36.0)
MCV: 105.3 fL — ABNORMAL HIGH (ref 78.0–100.0)
Monocytes Absolute: 2 10*3/uL — ABNORMAL HIGH (ref 0.1–1.0)
Monocytes Relative: 13 %
NEUTROS PCT: 80 %
Neutro Abs: 12.3 10*3/uL — ABNORMAL HIGH (ref 1.7–7.7)
PLATELETS: 317 10*3/uL (ref 150–400)
RBC: 3.22 MIL/uL — AB (ref 3.87–5.11)
RDW: 13 % (ref 11.5–15.5)
WBC: 15.2 10*3/uL — AB (ref 4.0–10.5)

## 2017-12-24 LAB — GLUCOSE, CAPILLARY
GLUCOSE-CAPILLARY: 116 mg/dL — AB (ref 65–99)
GLUCOSE-CAPILLARY: 116 mg/dL — AB (ref 65–99)
Glucose-Capillary: 105 mg/dL — ABNORMAL HIGH (ref 65–99)
Glucose-Capillary: 124 mg/dL — ABNORMAL HIGH (ref 65–99)

## 2017-12-24 LAB — BASIC METABOLIC PANEL
Anion gap: 9 (ref 5–15)
BUN: 14 mg/dL (ref 6–20)
CHLORIDE: 101 mmol/L (ref 101–111)
CO2: 26 mmol/L (ref 22–32)
Calcium: 8.6 mg/dL — ABNORMAL LOW (ref 8.9–10.3)
Creatinine, Ser: 0.49 mg/dL (ref 0.44–1.00)
GFR calc Af Amer: >60 mL/min (ref >60)
GFR calc non Af Amer: >60 mL/min (ref >60)
Glucose, Bld: 122 mg/dL — ABNORMAL HIGH (ref 65–99)
POTASSIUM: 4.1 mmol/L (ref 3.5–5.1)
SODIUM: 136 mmol/L (ref 135–145)

## 2017-12-24 LAB — PHOSPHORUS: PHOSPHORUS: 2.7 mg/dL (ref 2.5–4.6)

## 2017-12-24 LAB — MAGNESIUM: Magnesium: 1.7 mg/dL (ref 1.7–2.4)

## 2017-12-24 MED ORDER — ACETAMINOPHEN 10 MG/ML IV SOLN
1000.0000 mg | Freq: Once | INTRAVENOUS | Status: AC
  Administered 2017-12-24: 1000 mg via INTRAVENOUS
  Filled 2017-12-24: qty 100

## 2017-12-24 MED ORDER — TRACE MINERALS CR-CU-MN-SE-ZN 10-1000-500-60 MCG/ML IV SOLN
INTRAVENOUS | Status: AC
  Administered 2017-12-24: 17:00:00 via INTRAVENOUS
  Filled 2017-12-24: qty 1440

## 2017-12-24 MED ORDER — PIPERACILLIN-TAZOBACTAM 3.375 G IVPB
3.3750 g | Freq: Three times a day (TID) | INTRAVENOUS | Status: DC
  Administered 2017-12-24 – 2017-12-26 (×6): 3.375 g via INTRAVENOUS
  Filled 2017-12-24 (×6): qty 50

## 2017-12-24 MED ORDER — MAGNESIUM SULFATE 2 GM/50ML IV SOLN
2.0000 g | Freq: Once | INTRAVENOUS | Status: AC
  Administered 2017-12-24: 2 g via INTRAVENOUS
  Filled 2017-12-24: qty 50

## 2017-12-24 MED ORDER — FAT EMULSION 20 % IV EMUL
240.0000 mL | INTRAVENOUS | Status: AC
  Administered 2017-12-24: 240 mL via INTRAVENOUS
  Filled 2017-12-24: qty 250

## 2017-12-24 NOTE — Progress Notes (Signed)
5 Days Post-Op   Subjective/Chief Complaint: No complaints. Feels better   Objective: Vital signs in last 24 hours: Temp:  [98.9 F (37.2 C)-101.6 F (38.7 C)] 101.6 F (38.7 C) (04/03 1400) Pulse Rate:  [80-117] 117 (04/03 1400) Resp:  [15-31] 15 (04/03 1400) BP: (98-117)/(62-80) 110/80 (04/03 1400) SpO2:  [92 %-99 %] 96 % (04/03 1400) Last BM Date: 12/16/17  Intake/Output from previous day: 04/02 0701 - 04/03 0700 In: 1600 [I.V.:1600] Out: 1560 [Urine:1525; Drains:35] Intake/Output this shift: Total I/O In: -  Out: 330 [Urine:300; Drains:30]  General appearance: alert and cooperative Resp: clear to auscultation bilaterally Cardio: regular rate and rhythm GI: soft, mild tenderness. incision looks good. good bs. drain output serous  Lab Results:  Recent Labs    12/22/17 0346 12/24/17 0918  WBC 14.3* 15.2*  HGB 12.4 10.9*  HCT 37.2 33.9*  PLT 346 317   BMET Recent Labs    12/23/17 0430 12/24/17 0502  NA 137 136  K 4.0 4.1  CL 102 101  CO2 28 26  GLUCOSE 130* 122*  BUN 16 14  CREATININE 0.50 0.49  CALCIUM 8.5* 8.6*   PT/INR No results for input(s): LABPROT, INR in the last 72 hours. ABG No results for input(s): PHART, HCO3 in the last 72 hours.  Invalid input(s): PCO2, PO2  Studies/Results: Dg Ugi W/water Sol Cm  Result Date: 12/22/2017 CLINICAL DATA:  Recent gastric outlet obstruction treated with pyloroplasty. EXAM: WATER SOLUBLE UPPER GI SERIES TECHNIQUE: Single-column upper GI series was performed using water soluble contrast. CONTRAST:  Water-soluble contrast COMPARISON:  Upper GI dated 11/19/2017 FLUOROSCOPY TIME:  Fluoroscopy Time: 2.9 minutes Radiation Exposure Index (if provided by the fluoroscopic device): 47.9 mGy FINDINGS: Water-soluble contrast was instilled through the indwelling NG tube. The patient was placed in a semi-erect position and then turned into left and right lateral decubitus positions in semi erect and supine position. The  water-soluble contrast slowly passed through the distorted pylorus and duodenal bulb into the duodenum. There is some mucosal edema or scarring in that area. However, contrast does pass into the nondistended duodenum. No evidence of contrast extravasation. At the completion of the procedure the majority of the contrast was aspirated from the stomach through the NG tube. Note is made of what appears to be atypical atelectasis at the left lung base posterior medially. IMPRESSION: 1. No evidence of leakage from the site of pyloroplasty. There is some postsurgical edema as well as chronic scarring at the pylorus. 2. Atelectasis at the left lung base posterior medially. Electronically Signed   By: Lorriane Shire M.D.   On: 12/22/2017 15:56    Anti-infectives: Anti-infectives (From admission, onward)   Start     Dose/Rate Route Frequency Ordered Stop   12/19/17 2200  cefoTEtan (CEFOTAN) 2 g in sodium chloride 0.9 % 100 mL IVPB     2 g 200 mL/hr over 30 Minutes Intravenous Every 12 hours 12/19/17 1555 12/19/17 2256   12/19/17 0727  cefoTEtan in Dextrose 5% (CEFOTAN) IVPB 2 g     2 g Intravenous On call to O.R. 12/19/17 4128 12/19/17 0856      Assessment/Plan: s/p Procedure(s): LAPAROSCOPY, PYLOROPLASTY WITH GRAHAM PATCH (N/A) Advance diet. Start clears today Ambulate Continue tpn for nutrition Pod 5  LOS: 5 days    TOTH III,Xianna Siverling S 12/24/2017

## 2017-12-24 NOTE — Progress Notes (Signed)
PHARMACY - ADULT TOTAL PARENTERAL NUTRITION CONSULT NOTE   Pharmacy Consult for TPN Indication: malnutrition and need to heal pylorplasty  Patient Measurements: Height: 5\' 4"  (162.6 cm) Weight: 115 lb 1.3 oz (52.2 kg) IBW/kg (Calculated) : 54.7 TPN AdjBW (KG): 48.1 Body mass index is 19.75 kg/m. Usual Weight:  Insulin Requirements: sSSI q6h (1 unit since over the last 24 hours)   Current Nutrition: NPO  IVF: D5 0.45 NS with 20 mEq/L at 40 ml/hr  Central access: PICC placed on 3/29 TPN start date: 3/29 4/2 DC NG tube  4/3 start thin liquid diet   ASSESSMENT                                                                                                          HPI: Patient is a 52 y.o. 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  To start TPN post-op for malnutrition and for bowel rest to allow healing from pylorplasty.  Significant events:  - 3/29: pylorplasty  Today:   Glucose (goal <150): no hx DM; cbgs at goal  Electrolytes:  CorrCa wnl, magnesium 1.7 all others WNL  Renal: scr wnl, SCr 0.49  LFTs: wnl ( 4/1)   TGs: 96 (3/30), 124 ( 4/1)   Prealbumin: 27.6 (3/30), 23.4 ( 4/1)    NUTRITIONAL GOALS                                                                                             RD recs: 3/31   Kcal:  1450-1650 Protein:  70-80g Fluid:  1.7L/day   Clinimix E 5/15 at a goal rate of 60 ml/hr + 20% fat emulsion at 20 ml/hr for 12 hrs to provide: 72 g/day protein, 1502Kcal/day.  PLAN                                                                                                                         Magnesium sulfate 2 gr IV x1    At 1800 today:  Continue Clinimix E 5/15 at 60 ml/hr, the target rate   20% fat emulsion at 20 ml/hr for 12 hrs  Maintain as tolerated at the goal rate.  TPN to contain standard multivitamins and trace elements.  Continue IVF at 40  ml/hr.  Continue sensitive SSI q6h .   TPN lab panels on Mondays & Thursdays.  F/u daily.   Royetta Asal, PharmD, BCPS Pager [phone] 12/24/2017 12:51 PM

## 2017-12-25 LAB — GLUCOSE, CAPILLARY
GLUCOSE-CAPILLARY: 111 mg/dL — AB (ref 65–99)
GLUCOSE-CAPILLARY: 114 mg/dL — AB (ref 65–99)
GLUCOSE-CAPILLARY: 116 mg/dL — AB (ref 65–99)
GLUCOSE-CAPILLARY: 97 mg/dL (ref 65–99)
Glucose-Capillary: 104 mg/dL — ABNORMAL HIGH (ref 65–99)
Glucose-Capillary: 121 mg/dL — ABNORMAL HIGH (ref 65–99)

## 2017-12-25 LAB — COMPREHENSIVE METABOLIC PANEL
ALT: 31 U/L (ref 14–54)
AST: 18 U/L (ref 15–41)
Albumin: 2.1 g/dL — ABNORMAL LOW (ref 3.5–5.0)
Alkaline Phosphatase: 134 U/L — ABNORMAL HIGH (ref 38–126)
Anion gap: 8 (ref 5–15)
BILIRUBIN TOTAL: 0.5 mg/dL (ref 0.3–1.2)
BUN: 14 mg/dL (ref 6–20)
CHLORIDE: 103 mmol/L (ref 101–111)
CO2: 28 mmol/L (ref 22–32)
Calcium: 8.4 mg/dL — ABNORMAL LOW (ref 8.9–10.3)
Creatinine, Ser: 0.53 mg/dL (ref 0.44–1.00)
Glucose, Bld: 116 mg/dL — ABNORMAL HIGH (ref 65–99)
POTASSIUM: 4.1 mmol/L (ref 3.5–5.1)
Sodium: 139 mmol/L (ref 135–145)
TOTAL PROTEIN: 5.8 g/dL — AB (ref 6.5–8.1)

## 2017-12-25 LAB — PHOSPHORUS: PHOSPHORUS: 3.4 mg/dL (ref 2.5–4.6)

## 2017-12-25 LAB — MAGNESIUM: MAGNESIUM: 2 mg/dL (ref 1.7–2.4)

## 2017-12-25 MED ORDER — TRACE MINERALS CR-CU-MN-SE-ZN 10-1000-500-60 MCG/ML IV SOLN
INTRAVENOUS | Status: DC
  Administered 2017-12-25: 17:00:00 via INTRAVENOUS
  Filled 2017-12-25: qty 1440

## 2017-12-25 MED ORDER — FAT EMULSION 20 % IV EMUL
240.0000 mL | INTRAVENOUS | Status: AC
  Administered 2017-12-25: 240 mL via INTRAVENOUS
  Filled 2017-12-25: qty 250

## 2017-12-25 NOTE — Progress Notes (Signed)
Nutrition Follow-up  DOCUMENTATION CODES:   Non-severe (moderate) malnutrition in context of chronic illness  INTERVENTION:   TPN per Pharmacy Will monitor for further diet advancement, will order Boost Plus at that time.  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  Ongoing.  GOAL:   Patient will meet greater than or equal to 90% of their needs  Meeting with TPN.  MONITOR:   Diet advancement, Labs, Weight trends, I & O's(TPN)  ASSESSMENT:    52 year old female who presents with non-malignant abdominal pain.  3/29: s/p Laparoscopy with enterolysis, open Kocherization of duodenum, H-M type pyloroplasty with omental patch over a 34 Fr. tube  Pt continues to receive TPN at goal rate: Clinimix E 5/15 at a goal rate of 60 ml/hr + 20% fat emulsion at 20 ml/hr for 12 hrs, provides 1502 kcal and 72g protein. Pt was walking the hallway. Pt now on clear liquid diet. NGT was d/c 4/3.  Will continue to monitor for further diet advancement. Will order Boost Plus once diet is advanced to at least fulls.  Medications: D5 and .45% NaCl w/ KCl infusion at 40 ml/hr- provides 192 kcal  Labs reviewed: CBGs: 104-116 Mg/Phos WNL   Diet Order:  Diet clear liquid Room service appropriate? Yes; Fluid consistency: Thin .TPN (CLINIMIX-E) Adult  EDUCATION NEEDS:   Education needs have been addressed  Skin:  Skin Assessment: Reviewed RN Assessment  Last BM:  3/26  Height:   Ht Readings from Last 1 Encounters:  12/19/17 5\' 4"  (1.626 m)    Weight:   Wt Readings from Last 1 Encounters:  12/20/17 115 lb 1.3 oz (52.2 kg)    Ideal Body Weight:  54.5 kg  BMI:  Body mass index is 19.75 kg/m.  Estimated Nutritional Needs:   Kcal:  4128-7867  Protein:  70-80g  Fluid:  1.7L/day   Clayton Bibles, MS, RD, LDN [facility] Pager: [phone] After Hours Pager: [phone]

## 2017-12-25 NOTE — Progress Notes (Signed)
PHARMACY - ADULT TOTAL PARENTERAL NUTRITION CONSULT NOTE   Pharmacy Consult for TPN Indication: malnutrition and need to heal pylorplasty  Patient Measurements: Height: 5\' 4"  (162.6 cm) Weight: 115 lb 1.3 oz (52.2 kg) IBW/kg (Calculated) : 54.7 TPN AdjBW (KG): 48.1 Body mass index is 19.75 kg/m. Usual Weight:  Insulin Requirements: sSSI q6h (0 unit since over the last 24 hours)   Current Nutrition: NPO  IVF: D5 0.45 NS with 20 mEq/L at 40 ml/hr  Central access: PICC placed on 3/29 TPN start date: 3/29 4/2 DC NG tube  4/3 start thin liquid diet   ASSESSMENT                                                                                                          HPI: Patient is a 52 y.o. 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  To start TPN post-op for malnutrition and for bowel rest to allow healing from pylorplasty.  Significant events:  - 3/29: pylorplasty  Today:   Glucose (goal <150): no hx DM; cbgs at goal  Electrolytes:  CorrCa wnl,all others WNL  Renal: scr wnl, SCr 0.53  LFTs: wnl ( 4/1), (4/4)  TGs: 96 (3/30), 124 ( 4/1)   Prealbumin: 27.6 (3/30), 23.4 ( 4/1)    NUTRITIONAL GOALS                                                                                             RD recs: 3/31   Kcal:  1450-1650 Protein:  70-80g Fluid:  1.7L/day   Clinimix E 5/15 at a goal rate of 60 ml/hr + 20% fat emulsion at 20 ml/hr for 12 hrs to provide: 72 g/day protein, 1502Kcal/day.  PLAN                                                                                                                             At 1800 today:  Continue Clinimix E 5/15 at 60 ml/hr, the target rate   20% fat emulsion at 20 ml/hr for 12 hrs  Maintain as tolerated at the goal rate.  TPN to contain standard multivitamins and trace elements.  Continue IVF at 40 ml/hr.  Continue sensitive SSI q6h .   TPN lab  panels on Mondays & Thursdays.  F/u daily.   Royetta Asal, PharmD, BCPS Pager [phone] 12/25/2017 12:57 PM

## 2017-12-25 NOTE — Progress Notes (Signed)
6 Days Post-Op   Subjective/Chief Complaint: Feels better and wants to go home   Objective: Vital signs in last 24 hours: Temp:  [99 F (37.2 C)-100.5 F (38.1 C)] 99.8 F (37.7 C) (04/04 1427) Pulse Rate:  [93-118] 102 (04/04 1427) Resp:  [10-19] 10 (04/04 1427) BP: (85-116)/(54-76) 105/63 (04/04 1427) SpO2:  [94 %-98 %] 97 % (04/04 1427) Last BM Date: 12/16/17  Intake/Output from previous day: 04/03 0701 - 04/04 0700 In: 1559.3 [P.O.:150; I.V.:1309.3; IV Piggyback:100] Out: 347 [Urine:300; Drains:47] Intake/Output this shift: Total I/O In: -  Out: 25 [Drains:25]  General appearance: alert and cooperative Resp: clear to auscultation bilaterally Cardio: regular rate and rhythm GI: soft, minimal tenderness. incision looks good. good bs  Lab Results:  Recent Labs    12/24/17 0918  WBC 15.2*  HGB 10.9*  HCT 33.9*  PLT 317   BMET Recent Labs    12/24/17 0502 12/25/17 0529  NA 136 139  K 4.1 4.1  CL 101 103  CO2 26 28  GLUCOSE 122* 116*  BUN 14 14  CREATININE 0.49 0.53  CALCIUM 8.6* 8.4*   PT/INR No results for input(s): LABPROT, INR in the last 72 hours. ABG No results for input(s): PHART, HCO3 in the last 72 hours.  Invalid input(s): PCO2, PO2  Studies/Results: Dg Chest 2 View  Result Date: 12/24/2017 CLINICAL DATA:  Fever.  Recent abdominal surgery. EXAM: CHEST - 2 VIEW COMPARISON:  Chest x-ray dated Jan 21, 2017. FINDINGS: Right PICC line with the tip at the cavoatrial junction. The heart size and mediastinal contours are within normal limits. Normal pulmonary vascularity. Bibasilar airspace opacities, favored to reflect atelectasis. Trace bilateral pleural effusions. No consolidation or pneumothorax. No acute osseous abnormality. IMPRESSION: 1. Relatively symmetric bibasilar airspace opacities are favored to reflect atelectasis. 2. Trace bilateral pleural effusions. Electronically Signed   By: Titus Dubin M.D.   On: 12/24/2017 19:16     Anti-infectives: Anti-infectives (From admission, onward)   Start     Dose/Rate Route Frequency Ordered Stop   12/24/17 1800  piperacillin-tazobactam (ZOSYN) IVPB 3.375 g     3.375 g 12.5 mL/hr over 240 Minutes Intravenous Every 8 hours 12/24/17 1704     12/19/17 2200  cefoTEtan (CEFOTAN) 2 g in sodium chloride 0.9 % 100 mL IVPB     2 g 200 mL/hr over 30 Minutes Intravenous Every 12 hours 12/19/17 1555 12/19/17 2256   12/19/17 0727  cefoTEtan in Dextrose 5% (CEFOTAN) IVPB 2 g     2 g Intravenous On call to O.R. 12/19/17 1245 12/19/17 0856      Assessment/Plan: s/p Procedure(s): LAPAROSCOPY, PYLOROPLASTY WITH GRAHAM PATCH (N/A) Advance diet. Start fulls this afternoon and soft diet in am Continue empiric abx for fevers. Consider d/c of tpn and picc tomorrow if she continues to improve ambulate  LOS: 6 days    TOTH III,Melo Stauber S 12/25/2017

## 2017-12-26 LAB — GLUCOSE, CAPILLARY
GLUCOSE-CAPILLARY: 117 mg/dL — AB (ref 65–99)
GLUCOSE-CAPILLARY: 91 mg/dL (ref 65–99)

## 2017-12-26 MED ORDER — TRAMADOL HCL 50 MG PO TABS: 50.0000 mg | ORAL_TABLET | Freq: Four times a day (QID) | ORAL | Status: DC | PRN

## 2017-12-26 MED ORDER — TRACE MINERALS CR-CU-MN-SE-ZN 10-1000-500-60 MCG/ML IV SOLN
INTRAVENOUS | Status: DC
  Filled 2017-12-26: qty 1440

## 2017-12-26 MED ORDER — OXYCODONE HCL 5 MG PO TABS
5.0000 mg | ORAL_TABLET | ORAL | Status: DC | PRN
  Administered 2017-12-26: 5 mg via ORAL
  Filled 2017-12-26: qty 2

## 2017-12-26 MED ORDER — AMOXICILLIN-POT CLAVULANATE 875-125 MG PO TABS: 1.0000 | ORAL_TABLET | Freq: Two times a day (BID) | ORAL | 0 refills | Status: AC

## 2017-12-26 MED ORDER — PROMETHAZINE HCL 25 MG/ML IJ SOLN: 12.5000 mg | Freq: Four times a day (QID) | INTRAMUSCULAR | Status: DC | PRN

## 2017-12-26 NOTE — Progress Notes (Signed)
Discharge and medication instructions reviewed with patient. Questions answered. Patient denies further questions. No prescriptions given. Patient's brother is coming to drive her home. Donne Hazel, RN

## 2017-12-26 NOTE — Progress Notes (Signed)
7 Days Post-Op  Subjective: Patient wants to go home today, says she doesn't have a ride for tomorrow.  Passing flatus and moving her bowels a small amount.  Tolerating soft diet.  Objective: Vital signs in last 24 hours: Temp:  [98.7 F (37.1 C)-99.8 F (37.7 C)] 98.8 F (37.1 C) (04/05 0339) Pulse Rate:  [16-115] 16 (04/05 0339) Resp:  [10-18] 16 (04/05 0800) BP: (93-120)/(62-72) 93/62 (04/05 0339) SpO2:  [96 %-100 %] 96 % (04/05 0805) Last BM Date: 12/25/17  Intake/Output from previous day: 04/04 0701 - 04/05 0700 In: 1602.3 [I.V.:1552.3; IV Piggyback:50] Out: 825 [Urine:800; Drains:25] Intake/Output this shift: Total I/O In: 175 [P.O.:175] Out: 350 [Urine:350]  PE: Abd: soft, appropriately tender, incision is c/d/i with staples and honeycomb dressing.  +BS, ND Heart: regular Lungs: CTAB  Lab Results:  Recent Labs    12/24/17 0918  WBC 15.2*  HGB 10.9*  HCT 33.9*  PLT 317   BMET Recent Labs    12/24/17 0502 12/25/17 0529  NA 136 139  K 4.1 4.1  CL 101 103  CO2 26 28  GLUCOSE 122* 116*  BUN 14 14  CREATININE 0.49 0.53  CALCIUM 8.6* 8.4*   PT/INR No results for input(s): LABPROT, INR in the last 72 hours. CMP     Component Value Date/Time   NA 139 12/25/2017 0529   K 4.1 12/25/2017 0529   CL 103 12/25/2017 0529   CO2 28 12/25/2017 0529   GLUCOSE 116 (H) 12/25/2017 0529   BUN 14 12/25/2017 0529   CREATININE 0.53 12/25/2017 0529   CALCIUM 8.4 (L) 12/25/2017 0529   PROT 5.8 (L) 12/25/2017 0529   ALBUMIN 2.1 (L) 12/25/2017 0529   AST 18 12/25/2017 0529   ALT 31 12/25/2017 0529   ALKPHOS 134 (H) 12/25/2017 0529   BILITOT 0.5 12/25/2017 0529   GFRNONAA >60 12/25/2017 0529   GFRAA >60 12/25/2017 0529   Lipase     Component Value Date/Time   LIPASE 23 01/21/2017 0057       Studies/Results: Dg Chest 2 View  Result Date: 12/24/2017 CLINICAL DATA:  Fever.  Recent abdominal surgery. EXAM: CHEST - 2 VIEW COMPARISON:  Chest x-ray dated  Jan 21, 2017. FINDINGS: Right PICC line with the tip at the cavoatrial junction. The heart size and mediastinal contours are within normal limits. Normal pulmonary vascularity. Bibasilar airspace opacities, favored to reflect atelectasis. Trace bilateral pleural effusions. No consolidation or pneumothorax. No acute osseous abnormality. IMPRESSION: 1. Relatively symmetric bibasilar airspace opacities are favored to reflect atelectasis. 2. Trace bilateral pleural effusions. Electronically Signed   By: Titus Dubin M.D.   On: 12/24/2017 19:16    Anti-infectives: Anti-infectives (From admission, onward)   Start     Dose/Rate Route Frequency Ordered Stop   12/24/17 1800  piperacillin-tazobactam (ZOSYN) IVPB 3.375 g     3.375 g 12.5 mL/hr over 240 Minutes Intravenous Every 8 hours 12/24/17 1704     12/19/17 2200  cefoTEtan (CEFOTAN) 2 g in sodium chloride 0.9 % 100 mL IVPB     2 g 200 mL/hr over 30 Minutes Intravenous Every 12 hours 12/19/17 1555 12/19/17 2256   12/19/17 0727  cefoTEtan in Dextrose 5% (CEFOTAN) IVPB 2 g     2 g Intravenous On call to O.R. 12/19/17 7209 12/19/17 0856       Assessment/Plan POD 6, s/p pyloroplasty with graham patch - DC PCA and start oral pain meds -DC PICC once TNA weaned off today -cont  soft diet  HCAP -zosyn currently, will ask Dr. Marlou Starks if this can be stopped or if we ned to change to oral abx.   FEN - soft diet, wean TNA to off today VTE - heparin ID - Zosyn 4/3 -->   LOS: 7 days    [name] , [facility] 12/26/2017, 10:06 AM Pager: [phone]

## 2017-12-26 NOTE — Discharge Summary (Signed)
Patient ID: TOLA MEAS [id number] [birth year] 52 y.o.  Admit date: 12/19/2017 Discharge date: 12/26/2017  Admitting Diagnosis: Partial gastric outlet obstruction  Discharge Diagnosis Patient Active Problem List   Diagnosis Date Noted  . Malnutrition of moderate degree 12/23/2017  . Hx of pyloroplasty March 2019 12/19/2017  . Loss of weight 10/30/2017  . DU (duodenal ulcer) 07/18/2017  . Peptic ulcer of stomach 07/18/2017  . Abdominal pain 01/21/2017  . History of gastric ulcer 01/21/2017  . Hypertension 01/21/2017  . Fibromyalgia 01/21/2017  . Essential hypertension, benign 12/26/2016  . Abdominal pain, epigastric 12/26/2016  . 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 Fibroadenoma of right breast 05/19/2015    Consultants none  Reason for Admission: The patient is a 52 year old female who presents with non-malignant abdominal pain. She was referred by Dr. Laural Golden with copies of her endoscopy. She is Environmental manager. 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 There is no operative report listed but she had an operation by Dr. Cleaster Corin that has a right subcostal incision. She was undergoing urgent surgery for abdominal pain bloating and that grew very acute. He appeared to have been operating on for gallbladder but according to her it sounds like he found a perforated ulcer. He may have done a Associate Professor.  Her review Dr. Chrys Racer endoscopy pictures and Epic and can see what appear to be ulcers in the duodenal region and prepyloric region. I think as a first step we need to get a copy of Dr. Thompson Caul operative note to make sure of what he did and then to get an upper GI series to look at her anatomy. Good upper GI series will give [place] some idea of her esophageal motility as well as look at any scarring that is present in her pyloric region and in the duodenal bulb. We'll go and get those studies and then I will see her back in the  office.  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 This will need a H-M pyloroplasty or a Jaboulay or a gastro jejunostomy. I explained this to them in detail. Will proceed at [facility] with laparoscopy and laparotomy.  Procedures Laparoscopy with enterolysis, open Kocherization of duodenum, H-M type pyloroplasty with omental patch over a 34 Fr. Tube, Dr. Hassell Done on 12-19-17  Hospital Course:  The patient was admitted after her procedure.  She had an NGT placed during surgery.  This was kept in place post operatively until an UGI was completed on POD 3.  This was negative for a leak and her NGT was removed.  Her diet was then able to be advanced as tolerates to a soft diet.  She was tolerating this by POD 7.  She was started on TNA on admission and this was able to be weaned off as well by discharge.  Her pain was controlled with tramadol.    She was noted to have some fevers on POD 5.  She was empirically started on zosyn to cover for possible HCAP.  This was stopped at discharge and she was sent home with 5 additional days of augmentin.  She was otherwise stable for DC home on POD 7 with staples in place.  Her JP drain was removed prior to DC.  Physical Exam: See earlier exam from progress note.  Allergies as of 12/26/2017      Reactions   Hydrocodone Other (See Comments)   Nausea, feels hot and  insomia   Sulfa Antibiotics Hives      Medication List    TAKE these medications   amLODipine 10 MG tablet Commonly known as:  NORVASC Take 10 mg by mouth every morning.   amoxicillin-clavulanate 875-125 MG tablet Commonly known as:  AUGMENTIN Take 1 tablet by mouth 2 (two) times daily for 5 days.   CENTRUM ADULTS Tabs Take 1 tablet by mouth daily.   esomeprazole 40 MG packet Commonly known as:  NEXIUM Take 40 mg by mouth daily before breakfast.   potassium chloride 10 MEQ tablet Commonly known as:  K-DUR,KLOR-CON Take 10 mEq by mouth every  morning.   traMADol 50 MG tablet Commonly known as:  ULTRAM Take 1 tablet (50 mg total) by mouth 3 (three) times daily as needed for moderate pain.   valACYclovir 1000 MG tablet Commonly known as:  VALTREX Take 1,000 mg by mouth daily.   vitamin B-12 1000 MCG tablet Commonly known as:  CYANOCOBALAMIN Take 1,000 mcg by mouth daily.        Follow-up Information    Johnathan Hausen, MD. Schedule an appointment as soon as possible for a visit in 2 week(s).   Specialty:  General Surgery Why:  our office will call you for a staples out appointment as well as a follow up appointment.  if you have not heard from them by Monday or Tuesday, please call. Contact information: [address] [place] [address] [phone]           Signed: Saverio Danker, [facility] 12/26/2017, 4:06 PM Pager: [phone]

## 2017-12-26 NOTE — Discharge Instructions (Signed)
[facility]      [facility] [phone]  OPEN ABDOMINAL SURGERY: POST OP INSTRUCTIONS  Always review your discharge instruction sheet given to you by the facility where your surgery was performed.  IF YOU HAVE DISABILITY OR FAMILY LEAVE FORMS, YOU MUST BRING THEM TO THE OFFICE FOR PROCESSING.  PLEASE DO NOT GIVE THEM TO YOUR DOCTOR.  1. A prescription for pain medication may be given to you upon discharge.  Take your pain medication as prescribed, if needed.  If narcotic pain medicine is not needed, then you may take acetaminophen (Tylenol) or ibuprofen (Advil) as needed. 2. Take your usually prescribed medications unless otherwise directed. 3. If you need a refill on your pain medication, please contact your pharmacy. They will contact our office to request authorization.  Prescriptions will not be filled after 5pm or on week-ends. 4. You should follow a light diet the first few days after arrival home, such as soup and crackers, pudding, etc.unless your doctor has advised otherwise. A high-fiber, low fat diet can be resumed as tolerated.   Be sure to include lots of fluids daily. Most patients will experience some swelling and bruising on the chest and neck area.  Ice packs will help.  Swelling and bruising can take several days to resolve 5. Most patients will experience some swelling and bruising in the area of the incision. Ice pack will help. Swelling and bruising can take several days to resolve..  6. It is common to experience some constipation if taking pain medication after surgery.  Increasing fluid intake and taking a stool softener will usually help or prevent this problem from occurring.  A mild laxative (Milk of Magnesia or Miralax) should be taken according to package directions if there are no bowel movements after 48 hours. 7.  You may have steri-strips (small skin tapes) in place directly over the incision.  These strips should be left on the skin for 7-10 days.  If your  surgeon used skin glue on the incision, you may shower in 24 hours.  The glue will flake off over the next 2-3 weeks.  Any sutures or staples will be removed at the office during your follow-up visit. You may find that a light gauze bandage over your incision may keep your staples from being rubbed or pulled. You may shower and replace the bandage daily. 8. ACTIVITIES:  You may resume regular (light) daily activities beginning the next day--such as daily self-care, walking, climbing stairs--gradually increasing activities as tolerated.  You may have sexual intercourse when it is comfortable.  Refrain from any heavy lifting or straining until approved by your doctor. a. You may drive when you no longer are taking prescription pain medication, you can comfortably wear a seatbelt, and you can safely maneuver your car and apply brakes b. Return to Work: ___________________________________ 9. You should see your doctor in the office for a follow-up appointment approximately two weeks after your surgery.  Make sure that you call for this appointment within a day or two after you arrive home to insure a convenient appointment time. OTHER INSTRUCTIONS:  _____________________________________________________________ _____________________________________________________________  WHEN TO CALL YOUR DOCTOR: 1. Fever over 101.0 2. Inability to urinate 3. Nausea and/or vomiting 4. Extreme swelling or bruising 5. Continued bleeding from incision. 6. Increased pain, redness, or drainage from the incision. 7. Difficulty swallowing or breathing 8. Muscle cramping or spasms. 9. Numbness or tingling in hands or feet or around lips.  The clinic staff is available to   answer your questions during regular business hours.  Please don't hesitate to call and ask to speak to one of the nurses if you have concerns.  For further questions, please visit [email or web address]   

## 2018-03-01 ENCOUNTER — Emergency Department (HOSPITAL_COMMUNITY)
Admission: EM | Admit: 2018-03-01 | Discharge: 2018-03-01 | Disposition: A | Discharge status: Home / Self Care | Payer: Medicaid Other | Attending: Emergency Medicine | Admitting: Emergency Medicine

## 2018-03-01 ENCOUNTER — Other Ambulatory Visit: Payer: Self-pay

## 2018-03-01 ENCOUNTER — Encounter (HOSPITAL_COMMUNITY): Payer: Self-pay | Admitting: *Deleted

## 2018-03-01 DIAGNOSIS — W57XXXA Bitten or stung by nonvenomous insect and other nonvenomous arthropods, initial encounter: Secondary | ICD-10-CM | POA: Insufficient documentation

## 2018-03-01 DIAGNOSIS — R110 Nausea: Secondary | ICD-10-CM | POA: Insufficient documentation

## 2018-03-01 DIAGNOSIS — I10 Essential (primary) hypertension: Secondary | ICD-10-CM | POA: Diagnosis not present

## 2018-03-01 DIAGNOSIS — Z79899 Other long term (current) drug therapy: Secondary | ICD-10-CM | POA: Diagnosis not present

## 2018-03-01 DIAGNOSIS — Y999 Unspecified external cause status: Secondary | ICD-10-CM | POA: Diagnosis not present

## 2018-03-01 DIAGNOSIS — Y92002 Bathroom of unspecified non-institutional (private) residence single-family (private) house as the place of occurrence of the external cause: Secondary | ICD-10-CM | POA: Diagnosis not present

## 2018-03-01 DIAGNOSIS — Y939 Activity, unspecified: Secondary | ICD-10-CM | POA: Insufficient documentation

## 2018-03-01 DIAGNOSIS — F17210 Nicotine dependence, cigarettes, uncomplicated: Secondary | ICD-10-CM | POA: Insufficient documentation

## 2018-03-01 DIAGNOSIS — S70261A Insect bite (nonvenomous), right hip, initial encounter: Secondary | ICD-10-CM

## 2018-03-01 MED ORDER — DOXYCYCLINE HYCLATE 100 MG PO CAPS: 100.0000 mg | ORAL_CAPSULE | Freq: Two times a day (BID) | ORAL | 0 refills | Status: DC

## 2018-03-01 NOTE — ED Provider Notes (Signed)
[facility] EMERGENCY DEPARTMENT Provider Note   CSN: [id number] Arrival date & time: 03/01/18  1723     History   Chief Complaint Chief Complaint  Patient presents with  . Insect Bite    HPI [name] is a 52 y.o. female.  HPI   [name] is a 52 y.o. female who presents to the Emergency Department complaining of a possible spider bite to her right hip.  States that she noticed two spiders in her bathtub three days ago, felt that one of the spiders bit her.  Noticed a small slightly raised and red "pimple" that is somewhat painful to touch.  Also complains of nausea without vomiting, swelling, drainage, chills, tick bite or rash.  Patient is concerned that it may be a brown recluse bite.  Denies open wound or necrotic center.      Past Medical History:  Diagnosis Date  . Anxiety   . Depression   . Essential hypertension, benign 12/26/2016  . Fibroadenoma of right breast    S/P LUMPECTOMY W/ RADIOACTIVE SEED IMPLANT 05-19-2015  . Fibromyalgia   . GERD (gastroesophageal reflux disease)   . History of cervical dysplasia    s/p conzation and recurrent s/p  vaginal hysterectomy 2008  . History of duodenal ulcer    1992--  S/P  REPAIR PERFORATED ULCER  . History of gastric ulcer    age 36  . Hypertension   . IBS (irritable bowel syndrome)   . VIN II (vulvar intraepithelial neoplasia II)   . Wears glasses   . Wears partial dentures    lower    Patient Active Problem List   Diagnosis Date Noted  . Malnutrition of moderate degree 12/23/2017  . Hx of pyloroplasty March 2019 12/19/2017  . Loss of weight 10/30/2017  . DU (duodenal ulcer) 07/18/2017  . Peptic ulcer of stomach 07/18/2017  . Abdominal pain 01/21/2017  . History of gastric ulcer 01/21/2017  . Hypertension 01/21/2017  . Fibromyalgia 01/21/2017  . Essential hypertension, benign 12/26/2016  . Abdominal pain, epigastric 12/26/2016  . 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  Fibroadenoma of right breast 05/19/2015    Past Surgical History:  Procedure Laterality Date  . BLADDER SLING PROCEDURE  2005  approx.  . CERVICAL CONIZATION W/BX  2006  approx  . CO2 LASER APPLICATION N/A 1/60/1093   Procedure: CO2 LASER OF THE VAGINA;  Surgeon: Everitt Amber, MD;  Location: [facility];  Service: Gynecology;  Laterality: N/A;  . ESOPHAGOGASTRODUODENOSCOPY N/A 01/21/2017   Procedure: ESOPHAGOGASTRODUODENOSCOPY (EGD);  Surgeon: Rogene Houston, MD;  Location: [facility];  Service: Endoscopy;  Laterality: N/A;  . ESOPHAGOGASTRODUODENOSCOPY N/A 07/17/2017   Procedure: ESOPHAGOGASTRODUODENOSCOPY (EGD);  Surgeon: Rogene Houston, MD;  Location: [facility];  Service: Endoscopy;  Laterality: N/A;  1:00  . ESOPHAGOGASTRODUODENOSCOPY N/A 10/16/2017   Procedure: ESOPHAGOGASTRODUODENOSCOPY (EGD)WITH DILATION;  Surgeon: Rogene Houston, MD;  Location: [facility];  Service: Endoscopy;  Laterality: N/A;  1030  . ESOPHAGOGASTRODUODENOSCOPY (EGD) WITH PROPOFOL N/A 01/23/2017   Procedure: ESOPHAGOGASTRODUODENOSCOPY (EGD) WITH PROPOFOL;  Surgeon: Rogene Houston, MD;  Location: [facility];  Service: Endoscopy;  Laterality: N/A;  duodenal striture dilation  . LAPAROSCOPY N/A 12/19/2017   Procedure: LAPAROSCOPY, PYLOROPLASTY WITH Silvestre Gunner;  Surgeon: Johnathan Hausen, MD;  Location: [facility];  Service: General;  Laterality: N/A;  . RADIOACTIVE SEED GUIDED EXCISIONAL BREAST BIOPSY Right 05/19/2015   Procedure: EXCISIONAL RIGHT BREAST MASS WITH  RADIOACTIVE SEED LOCALIZATION;  Surgeon: Fanny Skates, MD;  Location: [facility];  Service: General;  Laterality: Right;  . REPAIR OF PERFORATED ULCER  1992   duodenal  . VAGINAL HYSTERECTOMY  11/13/2006     OB History   None      Home Medications    Prior to Admission medications   Medication Sig Start Date End Date Taking? Authorizing Provider  amLODipine (NORVASC) 10 MG tablet Take 10 mg by mouth every  morning.     [provider]  doxycycline (VIBRAMYCIN) 100 MG capsule Take 1 capsule (100 mg total) by mouth 2 (two) times daily. 03/01/18   Juddson Cobern, PA-C  esomeprazole (NEXIUM) 40 MG packet Take 40 mg by mouth daily before breakfast. 10/16/17   Rehman, Mechele Dawley, MD  Multiple Vitamins-Minerals (CENTRUM ADULTS) TABS Take 1 tablet by mouth daily.     [provider]  potassium chloride (K-DUR,KLOR-CON) 10 MEQ tablet Take 10 mEq by mouth every morning.    [provider]  traMADol (ULTRAM) 50 MG tablet Take 1 tablet (50 mg total) by mouth 3 (three) times daily as needed for moderate pain. 10/16/17   Rehman, Mechele Dawley, MD  valACYclovir (VALTREX) 1000 MG tablet Take 1,000 mg by mouth daily. 07/26/16   [provider]  vitamin B-12 (CYANOCOBALAMIN) 1000 MCG tablet Take 1,000 mcg by mouth daily.    [provider]    Family History Family History  Problem Relation Age of Onset  . Hypertension Mother   . Clotting disorder Father   . Diabetes Father   . Hypertension Father   . Hypertension Sister   . Hypertension Brother   . Hypertension Maternal Aunt   . Hypertension Maternal Uncle   . Hypertension Paternal Aunt   . Hypertension Paternal Uncle     Social History Social History   Tobacco Use  . Smoking status: Current Every Day Smoker    Packs/day: 1.00    Years: 18.00    Pack years: 18.00    Types: Cigarettes  . Smokeless tobacco: Never Used  Substance Use Topics  . Alcohol use: No  . Drug use: No     Allergies   Hydrocodone and Sulfa antibiotics   Review of Systems Review of Systems  Constitutional: Negative for activity change, appetite change, chills and fever.  HENT: Negative for sore throat and trouble swallowing.   Respiratory: Negative for chest tightness and shortness of breath.   Gastrointestinal: Positive for nausea. Negative for abdominal pain and vomiting.  Musculoskeletal: Negative for arthralgias, myalgias, neck  pain and neck stiffness.  Skin: Negative for rash and wound.       Spider bite right hip  Neurological: Negative for dizziness, weakness, numbness and headaches.  All other systems reviewed and are negative.    Physical Exam Updated Vital Signs BP 135/79 (BP Location: Right Arm)   Pulse 92   Temp 98.3 F (36.8 C) (Oral)   Resp 18   Ht 5\' 4"  (1.626 m)   Wt 49.9 kg (110 lb)   SpO2 96%   BMI 18.88 kg/m   Physical Exam  Constitutional: She is oriented to person, place, and time. She appears well-developed and well-nourished. No distress.  HENT:  Head: Normocephalic and atraumatic.  Cardiovascular: Normal rate, regular rhythm and normal heart sounds.  No murmur heard. Pulmonary/Chest: Effort normal and breath sounds normal. No respiratory distress.  Abdominal: Soft. She exhibits no distension. There is no tenderness. There is no guarding.  Neurological: She is alert and oriented to person, place, and time. She exhibits normal muscle tone. Coordination normal.  Skin: Skin is warm and dry. There is erythema.  1 cm area of erythema with tiny central pustule.  No drainage, fluctuance or induration.  No surrounding erythema  Psychiatric: She has a normal mood and affect.  Nursing note and vitals reviewed.    ED Treatments / Results  Labs (all labs ordered are listed, but only abnormal results are displayed) Labs Reviewed - No data to display  EKG None  Radiology No results found.  Procedures Procedures (including critical care time)  Medications Ordered in ED Medications - No data to display   Initial Impression / Assessment and Plan / ED Course  I have reviewed the triage vital signs and the nursing notes.  Pertinent labs & imaging results that were available during my care of the patient were reviewed by me and considered in my medical decision making (see chart for details).     Pt well appearing.  Ambulatory.  Possible early abscess.  No indication for I&D.   Agrees to rx for doxy, warm water soaks and return precautions discussed.   Final Clinical Impressions(s) / ED Diagnoses   Final diagnoses:  Insect bite of right hip, initial encounter    ED Discharge Orders        Ordered    doxycycline (VIBRAMYCIN) 100 MG capsule  2 times daily     03/01/18 1831       Kem Parkinson, PA-C 03/01/18 2335    Milton Ferguson, MD 03/01/18 2336

## 2018-03-01 NOTE — ED Notes (Signed)
Tick bite to back of R upper leg  Pt states HA last night at 0300, area is hot and painful, and that she has pain into her abd and back   There is a small reddened area to R upper leg

## 2018-03-01 NOTE — Discharge Instructions (Addendum)
Warm wet soaks or compresses on/off.  Stop your penicillin and start the doxycycline tomorrow.  Return here for any worsening symptoms such as increasing redness, swelling, pain or fever.

## 2018-03-01 NOTE — ED Triage Notes (Addendum)
Pt c/o spider bite to back of right leg on Thursday. Pt c/o nausea, denies vomiting. Pt currently on Penicillin for an abscessed tooth. Pt c/o pain going down her leg and up into her side. Pt said it was a brown recluse spider.

## 2018-04-03 ENCOUNTER — Emergency Department (HOSPITAL_COMMUNITY): Payer: Medicaid Other

## 2018-04-03 ENCOUNTER — Other Ambulatory Visit: Payer: Self-pay

## 2018-04-03 ENCOUNTER — Observation Stay (HOSPITAL_COMMUNITY)
Admission: EM | Admit: 2018-04-03 | Discharge: 2018-04-03 | Disposition: A | Discharge status: Home / Self Care | Payer: Medicaid Other | Attending: Internal Medicine | Admitting: Internal Medicine

## 2018-04-03 ENCOUNTER — Encounter (HOSPITAL_COMMUNITY): Payer: Self-pay

## 2018-04-03 DIAGNOSIS — K219 Gastro-esophageal reflux disease without esophagitis: Secondary | ICD-10-CM | POA: Insufficient documentation

## 2018-04-03 DIAGNOSIS — I10 Essential (primary) hypertension: Secondary | ICD-10-CM | POA: Diagnosis present

## 2018-04-03 DIAGNOSIS — Z79891 Long term (current) use of opiate analgesic: Secondary | ICD-10-CM | POA: Insufficient documentation

## 2018-04-03 DIAGNOSIS — Z8711 Personal history of peptic ulcer disease: Secondary | ICD-10-CM | POA: Diagnosis not present

## 2018-04-03 DIAGNOSIS — E876 Hypokalemia: Secondary | ICD-10-CM | POA: Diagnosis not present

## 2018-04-03 DIAGNOSIS — Z9889 Other specified postprocedural states: Secondary | ICD-10-CM | POA: Diagnosis not present

## 2018-04-03 DIAGNOSIS — F17210 Nicotine dependence, cigarettes, uncomplicated: Secondary | ICD-10-CM | POA: Insufficient documentation

## 2018-04-03 DIAGNOSIS — Z79899 Other long term (current) drug therapy: Secondary | ICD-10-CM | POA: Diagnosis not present

## 2018-04-03 DIAGNOSIS — K589 Irritable bowel syndrome without diarrhea: Secondary | ICD-10-CM | POA: Diagnosis present

## 2018-04-03 DIAGNOSIS — M797 Fibromyalgia: Secondary | ICD-10-CM | POA: Insufficient documentation

## 2018-04-03 DIAGNOSIS — K920 Hematemesis: Secondary | ICD-10-CM | POA: Diagnosis present

## 2018-04-03 DIAGNOSIS — K5909 Other constipation: Secondary | ICD-10-CM | POA: Diagnosis not present

## 2018-04-03 DIAGNOSIS — R1084 Generalized abdominal pain: Secondary | ICD-10-CM

## 2018-04-03 DIAGNOSIS — Z8719 Personal history of other diseases of the digestive system: Secondary | ICD-10-CM

## 2018-04-03 DIAGNOSIS — K567 Ileus, unspecified: Principal | ICD-10-CM | POA: Diagnosis present

## 2018-04-03 LAB — COMPREHENSIVE METABOLIC PANEL
ALBUMIN: 3.5 g/dL (ref 3.5–5.0)
ALT: 9 U/L (ref 0–44)
AST: 16 U/L (ref 15–41)
Alkaline Phosphatase: 48 U/L (ref 38–126)
Anion gap: 7 (ref 5–15)
BUN: 16 mg/dL (ref 6–20)
CHLORIDE: 109 mmol/L (ref 98–111)
CO2: 24 mmol/L (ref 22–32)
CREATININE: 0.56 mg/dL (ref 0.44–1.00)
Calcium: 7.9 mg/dL — ABNORMAL LOW (ref 8.9–10.3)
GFR calc Af Amer: >60 mL/min (ref >60)
GLUCOSE: 94 mg/dL (ref 70–99)
POTASSIUM: 3.1 mmol/L — AB (ref 3.5–5.1)
Sodium: 140 mmol/L (ref 135–145)
Total Bilirubin: 0.3 mg/dL (ref 0.3–1.2)
Total Protein: 6.4 g/dL — ABNORMAL LOW (ref 6.5–8.1)

## 2018-04-03 LAB — CBC WITH DIFFERENTIAL/PLATELET
BASOS ABS: 0 10*3/uL (ref 0.0–0.1)
BASOS PCT: 0 %
EOS PCT: 0 %
Eosinophils Absolute: 0.1 10*3/uL (ref 0.0–0.7)
HCT: 42.1 % (ref 36.0–46.0)
Hemoglobin: 13.8 g/dL (ref 12.0–15.0)
LYMPHS PCT: 7 %
Lymphs Abs: 1 10*3/uL (ref 0.7–4.0)
MCH: 33.7 pg (ref 26.0–34.0)
MCHC: 32.8 g/dL (ref 30.0–36.0)
MCV: 102.9 fL — AB (ref 78.0–100.0)
Monocytes Absolute: 1 10*3/uL (ref 0.1–1.0)
Monocytes Relative: 7 %
Neutro Abs: 12.4 10*3/uL — ABNORMAL HIGH (ref 1.7–7.7)
Neutrophils Relative %: 86 %
PLATELETS: 329 10*3/uL (ref 150–400)
RBC: 4.09 MIL/uL (ref 3.87–5.11)
RDW: 14 % (ref 11.5–15.5)
WBC: 14.5 10*3/uL — AB (ref 4.0–10.5)

## 2018-04-03 LAB — SALICYLATE LEVEL: Salicylate Lvl: 8.6 mg/dL (ref 2.8–30.0)

## 2018-04-03 LAB — PROTIME-INR
INR: 0.98
PROTHROMBIN TIME: 12.9 s (ref 11.4–15.2)

## 2018-04-03 LAB — MAGNESIUM: Magnesium: 1.9 mg/dL (ref 1.7–2.4)

## 2018-04-03 MED ORDER — ACETAMINOPHEN 325 MG PO TABS: 650.0000 mg | ORAL_TABLET | Freq: Four times a day (QID) | ORAL | Status: DC | PRN

## 2018-04-03 MED ORDER — HYDROMORPHONE HCL 1 MG/ML IJ SOLN
1.0000 mg | Freq: Once | INTRAMUSCULAR | Status: AC
  Administered 2018-04-03: 1 mg via INTRAVENOUS
  Filled 2018-04-03: qty 1

## 2018-04-03 MED ORDER — ONDANSETRON HCL 4 MG/2ML IJ SOLN: 4.0000 mg | Freq: Four times a day (QID) | INTRAMUSCULAR | Status: DC | PRN

## 2018-04-03 MED ORDER — SODIUM CHLORIDE 0.9 % IV SOLN
80.0000 mg | Freq: Once | INTRAVENOUS | Status: AC
  Administered 2018-04-03: 07:00:00 80 mg via INTRAVENOUS
  Filled 2018-04-03: qty 80

## 2018-04-03 MED ORDER — METOCLOPRAMIDE HCL 5 MG/ML IJ SOLN
10.0000 mg | Freq: Once | INTRAMUSCULAR | Status: AC
  Administered 2018-04-03: 10 mg via INTRAVENOUS
  Filled 2018-04-03: qty 2

## 2018-04-03 MED ORDER — PANTOPRAZOLE SODIUM 40 MG IV SOLR: 40.0000 mg | Freq: Two times a day (BID) | INTRAVENOUS | Status: DC

## 2018-04-03 MED ORDER — ACETAMINOPHEN 650 MG RE SUPP: 650.0000 mg | Freq: Four times a day (QID) | RECTAL | Status: DC | PRN

## 2018-04-03 MED ORDER — SODIUM CHLORIDE 0.9 % IV SOLN
8.0000 mg/h | INTRAVENOUS | Status: DC
  Administered 2018-04-03: 8 mg/h via INTRAVENOUS
  Filled 2018-04-03 (×8): qty 80

## 2018-04-03 MED ORDER — PANTOPRAZOLE SODIUM 40 MG IV SOLR
INTRAVENOUS | Status: AC
  Filled 2018-04-03: qty 160

## 2018-04-03 MED ORDER — POLYETHYLENE GLYCOL 3350 17 G PO PACK: 17.0000 g | PACK | Freq: Every day | ORAL | 0 refills | Status: DC

## 2018-04-03 MED ORDER — ONDANSETRON HCL 4 MG PO TABS: 4.0000 mg | ORAL_TABLET | Freq: Four times a day (QID) | ORAL | Status: DC | PRN

## 2018-04-03 MED ORDER — PANTOPRAZOLE SODIUM 40 MG IV SOLR
40.0000 mg | Freq: Two times a day (BID) | INTRAVENOUS | Status: DC
  Filled 2018-04-03: qty 40

## 2018-04-03 MED ORDER — POTASSIUM CHLORIDE IN NACL 40-0.9 MEQ/L-% IV SOLN: INTRAVENOUS | Status: DC

## 2018-04-03 MED ORDER — POTASSIUM CHLORIDE 10 MEQ/100ML IV SOLN
10.0000 meq | INTRAVENOUS | Status: AC
  Administered 2018-04-03 (×2): 10 meq via INTRAVENOUS
  Filled 2018-04-03 (×4): qty 100

## 2018-04-03 MED ORDER — SODIUM CHLORIDE 0.9 % IV BOLUS
1000.0000 mL | Freq: Once | INTRAVENOUS | Status: AC
  Administered 2018-04-03: 1000 mL via INTRAVENOUS

## 2018-04-03 MED ORDER — IOPAMIDOL (ISOVUE-300) INJECTION 61%
100.0000 mL | Freq: Once | INTRAVENOUS | Status: AC | PRN
  Administered 2018-04-03: 100 mL via INTRAVENOUS

## 2018-04-03 MED ORDER — HYDROMORPHONE HCL 1 MG/ML IJ SOLN
1.0000 mg | INTRAMUSCULAR | Status: DC | PRN
  Administered 2018-04-03: 1 mg via INTRAVENOUS
  Filled 2018-04-03: qty 1

## 2018-04-03 MED ORDER — BISACODYL 10 MG RE SUPP
10.0000 mg | Freq: Two times a day (BID) | RECTAL | Status: DC
  Filled 2018-04-03: qty 1

## 2018-04-03 NOTE — Progress Notes (Signed)
IVs and NG tube removed, reviewed AVS with patient who verbalized understanding.  Patient refused wheelchair to take downstairs, patient walked down on her own.

## 2018-04-03 NOTE — ED Notes (Signed)
POC Occult blood was negative.

## 2018-04-03 NOTE — H&P (Signed)
History and Physical    [name] ZHG:[id number] DOB: [birth year] DOA: 04/03/2018  PCP: Glenda Chroman, MD  Patient coming from: home  I have personally briefly reviewed patient's old medical records in [place]  Chief Complaint: abdominal pain  HPI: [name] is a 52 y.o. female with medical history significant of pyloroplasty and peptic ulcer disease, hypertension, irritable bowel syndrome, presents to the hospital with complaints of abdominal pain.  Reports onset of symptoms occurring yesterday.  She had been in her usual state of health when she developed worsening abdominal pain across her entire abdomen, abdominal distention, nausea and had some episodes of vomiting.  She describes a vomitus is coffee-ground.  She has been constipated.  This is a chronic issue and she normally goes 2-3 times per week.  She was also diaphoretic at that time.  Denies any chest pain or shortness of breath.  She has not had any sick contacts.  She has not had any cough or dysuria.  ED Course: She was noted to be hypokalemic on basic labs.  Vitals were otherwise stable.  EKG did not show any acute changes.  CT abdomen indicated ileus as well as small bowel enteritis.  She is been referred for admission.  NG tube was placed in the emergency room.  Review of Systems: As per HPI otherwise 10 point review of systems negative.    Past Medical History:  Diagnosis Date  . Anxiety   . Depression   . Essential hypertension, benign 12/26/2016  . Fibroadenoma of right breast    S/P LUMPECTOMY W/ RADIOACTIVE SEED IMPLANT 05-19-2015  . Fibromyalgia   . GERD (gastroesophageal reflux disease)   . History of cervical dysplasia    s/p conzation and recurrent s/p  vaginal hysterectomy 2008  . History of duodenal ulcer    1992--  S/P  REPAIR PERFORATED ULCER  . History of gastric ulcer    age 87  . Hypertension   . IBS (irritable bowel syndrome)   . VIN II (vulvar intraepithelial neoplasia II)   .  Wears glasses   . Wears partial dentures    lower    Past Surgical History:  Procedure Laterality Date  . BLADDER SLING PROCEDURE  2005  approx.  . CERVICAL CONIZATION W/BX  2006  approx  . CO2 LASER APPLICATION N/A 1/96/2229   Procedure: CO2 LASER OF THE VAGINA;  Surgeon: Everitt Amber, MD;  Location: [facility];  Service: Gynecology;  Laterality: N/A;  . ESOPHAGOGASTRODUODENOSCOPY N/A 01/21/2017   Procedure: ESOPHAGOGASTRODUODENOSCOPY (EGD);  Surgeon: Rogene Houston, MD;  [facility];  Service: Endoscopy;  Laterality: N/A;  . ESOPHAGOGASTRODUODENOSCOPY N/A 07/17/2017   Procedure: ESOPHAGOGASTRODUODENOSCOPY (EGD);  Surgeon: Rogene Houston, MD;  [facility];  Service: Endoscopy;  Laterality: N/A;  1:00  . ESOPHAGOGASTRODUODENOSCOPY N/A 10/16/2017   Procedure: ESOPHAGOGASTRODUODENOSCOPY (EGD)WITH DILATION;  Surgeon: Rogene Houston, MD;  Location: [facility];  Service: Endoscopy;  Laterality: N/A;  1030  . ESOPHAGOGASTRODUODENOSCOPY (EGD) WITH PROPOFOL N/A 01/23/2017   Procedure: ESOPHAGOGASTRODUODENOSCOPY (EGD) WITH PROPOFOL;  Surgeon: Rogene Houston, MD;  [facility]: [facility];  Service: Endoscopy;  Laterality: N/A;  duodenal striture dilation  . LAPAROSCOPY N/A 12/19/2017   Procedure: LAPAROSCOPY, PYLOROPLASTY WITH Silvestre Gunner;  Surgeon: Johnathan Hausen, MD;  Location: [facility];  Service: General;  Laterality: N/A;  . RADIOACTIVE SEED GUIDED EXCISIONAL BREAST BIOPSY Right 05/19/2015   Procedure: EXCISIONAL RIGHT BREAST MASS WITH RADIOACTIVE SEED LOCALIZATION;  Surgeon: Fanny Skates, MD;  Location: [place];  Service: General;  Laterality: Right;  . REPAIR OF PERFORATED ULCER  1992   duodenal  . VAGINAL HYSTERECTOMY  11/13/2006     reports that she has been smoking cigarettes.  She has a 18.00 pack-year smoking history. She has never used smokeless tobacco. She reports that she does not drink alcohol or use drugs.  Allergies    Allergen Reactions  . Hydrocodone Other (See Comments)    Nausea, feels hot and insomia  . Sulfa Antibiotics Hives    Family History  Problem Relation Age of Onset  . Hypertension Mother   . Clotting disorder Father   . Diabetes Father   . Hypertension Father   . Hypertension Sister   . Hypertension Brother   . Hypertension Maternal Aunt   . Hypertension Maternal Uncle   . Hypertension Paternal Aunt   . Hypertension Paternal Uncle     Prior to Admission medications   Medication Sig Start Date End Date Taking? Authorizing Provider  amLODipine (NORVASC) 10 MG tablet Take 10 mg by mouth every morning.    Yes [provider]  esomeprazole (NEXIUM) 40 MG packet Take 40 mg by mouth daily before breakfast. 10/16/17  Yes Rehman, Mechele Dawley, MD  Multiple Vitamins-Minerals (CENTRUM ADULTS) TABS Take 1 tablet by mouth daily.     [provider]  potassium chloride (K-DUR,KLOR-CON) 10 MEQ tablet Take 10 mEq by mouth every morning.    [provider]  traMADol (ULTRAM) 50 MG tablet Take 1 tablet (50 mg total) by mouth 3 (three) times daily as needed for moderate pain. 10/16/17   Rehman, Mechele Dawley, MD  valACYclovir (VALTREX) 1000 MG tablet Take 1,000 mg by mouth daily. 07/26/16   [provider]  vitamin B-12 (CYANOCOBALAMIN) 1000 MCG tablet Take 1,000 mcg by mouth daily.    [provider]    Physical Exam: Vitals:   04/03/18 0230 04/03/18 0300 04/03/18 0655 04/03/18 0823  BP: 126/77 134/88 111/66 108/61  Pulse: 96 96 92 78  Resp: 17 11 (!) 22 18  Temp:    98.7 F (37.1 C)  TempSrc:    Oral  SpO2: 97% 95% 96% 97%  Weight:    51.4 kg (113 lb 5.1 oz)  Height:    5\' 4"  (1.626 m)    Constitutional: NAD, calm, comfortable Vitals:   04/03/18 0230 04/03/18 0300 04/03/18 0655 04/03/18 0823  BP: 126/77 134/88 111/66 108/61  Pulse: 96 96 92 78  Resp: 17 11 (!) 22 18  Temp:    98.7 F (37.1 C)  TempSrc:    Oral  SpO2: 97% 95% 96% 97%  Weight:     51.4 kg (113 lb 5.1 oz)  Height:    5\' 4"  (1.626 m)   Eyes: PERRL, lids and conjunctivae normal ENMT: Mucous membranes are moist. Posterior pharynx clear of any exudate or lesions.Normal dentition.  Neck: normal, supple, no masses, no thyromegaly Respiratory: clear to auscultation bilaterally, no wheezing, no crackles. Normal respiratory effort. No accessory muscle use.  Cardiovascular: Regular rate and rhythm, no murmurs / rubs / gallops. No extremity edema. 2+ pedal pulses. No carotid bruits.  Abdomen: Soft, diffuse tenderness, no masses palpated. No hepatosplenomegaly. Bowel sounds positive.  Musculoskeletal: no clubbing / cyanosis. No joint deformity upper and lower extremities. Good ROM, no contractures. Normal muscle tone.  Skin: no rashes, lesions, ulcers. No induration Neurologic: CN 2-12 grossly intact. Sensation intact, DTR normal. Strength 5/5  in all 4.  Psychiatric: Normal judgment and insight. Alert and oriented x 3. Normal mood.    Labs on Admission: I have personally reviewed following labs and imaging studies  CBC: Recent Labs  Lab 04/03/18 0153  WBC 14.5*  NEUTROABS 12.4*  HGB 13.8  HCT 42.1  MCV 102.9*  PLT 381   Basic Metabolic Panel: Recent Labs  Lab 04/03/18 0153  NA 140  K 3.1*  CL 109  CO2 24  GLUCOSE 94  BUN 16  CREATININE 0.56  CALCIUM 7.9*   GFR: Estimated Creatinine Clearance: 68.3 mL/min (by C-G formula based on SCr of 0.56 mg/dL). Liver Function Tests: Recent Labs  Lab 04/03/18 0153  AST 16  ALT 9  ALKPHOS 48  BILITOT 0.3  PROT 6.4*  ALBUMIN 3.5   No results for input(s): LIPASE, AMYLASE in the last 168 hours. No results for input(s): AMMONIA in the last 168 hours. Coagulation Profile: Recent Labs  Lab 04/03/18 0153  INR 0.98   Cardiac Enzymes: No results for input(s): CKTOTAL, CKMB, CKMBINDEX, TROPONINI in the last 168 hours. BNP (last 3 results) No results for input(s): PROBNP in the last 8760 hours. HbA1C: No results  for input(s): HGBA1C in the last 72 hours. CBG: No results for input(s): GLUCAP in the last 168 hours. Lipid Profile: No results for input(s): CHOL, HDL, LDLCALC, TRIG, CHOLHDL, LDLDIRECT in the last 72 hours. Thyroid Function Tests: No results for input(s): TSH, T4TOTAL, FREET4, T3FREE, THYROIDAB in the last 72 hours. Anemia Panel: No results for input(s): VITAMINB12, FOLATE, FERRITIN, TIBC, IRON, RETICCTPCT in the last 72 hours. Urine analysis:    Component Value Date/Time   COLORURINE AMBER (A) 01/21/2017 0012   APPEARANCEUR HAZY (A) 01/21/2017 0012   LABSPEC 1.030 01/21/2017 0012   PHURINE 5.0 01/21/2017 0012   GLUCOSEU NEGATIVE 01/21/2017 0012   HGBUR SMALL (A) 01/21/2017 0012   BILIRUBINUR NEGATIVE 01/21/2017 0012   KETONESUR 5 (A) 01/21/2017 0012   PROTEINUR 30 (A) 01/21/2017 0012   UROBILINOGEN 0.2 04/15/2014 1238   NITRITE NEGATIVE 01/21/2017 0012   LEUKOCYTESUR NEGATIVE 01/21/2017 0012    Radiological Exams on Admission: Ct Abdomen Pelvis W Contrast  Result Date: 04/03/2018 CLINICAL DATA:  Severe abdominal pain, hematemesis. History of hysterectomy, perforated duodenal ulcer. EXAM: CT ABDOMEN AND PELVIS WITH CONTRAST TECHNIQUE: Multidetector CT imaging of the abdomen and pelvis was performed using the standard protocol following bolus administration of intravenous contrast. CONTRAST:  160mL ISOVUE-300 IOPAMIDOL (ISOVUE-300) INJECTION 61% COMPARISON:  Abdominal radiograph April 03, 2018 and CT abdomen and pelvis January 08, 2017 FINDINGS: LOWER CHEST: Lung bases are clear. Included heart size is normal. No pericardial effusion. Retained versus reflux contrast in distal esophagus. HEPATOBILIARY: Liver and gallbladder are normal. PANCREAS: Normal. SPLEEN: Normal. ADRENALS/URINARY TRACT: Kidneys are orthotopic, demonstrating symmetric enhancement. No nephrolithiasis, hydronephrosis or solid renal masses. Too small to characterize hypodensities bilateral kidneys. The unopacified  ureters are normal in course and caliber. Delayed imaging through the kidneys demonstrates symmetric prompt contrast excretion within the proximal urinary collecting system. Urinary bladder is well distended and unremarkable. Normal adrenal glands. STOMACH/BOWEL: Fluid filled small bowel with mild mural hyperemia and thickening, multiple transition points including LEFT mid abdomen, RIGHT pelvis. Small and large bowel air-fluid levels. Moderate amount of mid to distal retained large bowel stool. Enteric contrast has not yet reached the distal small bowel. Contrast filled duodenal diverticula. Normal appendix. VASCULAR/LYMPHATIC: Aortoiliac vessels are normal in course and caliber. No lymphadenopathy by CT size criteria. REPRODUCTIVE: Status post  hysterectomy. OTHER: Small amount of low-density ascites. No intraperitoneal free air. No focal fluid collections. MUSCULOSKELETAL: Nonacute. Mild broad levoscoliosis may be positional. Anterior abdominal wall scarring. IMPRESSION: 1. Enteritis and ileus.  Moderate retained large bowel stool. 2. Small amount of ascites. Electronically Signed   By: Elon Alas M.D.   On: 04/03/2018 05:22   Dg Chest Portable 1 View  Result Date: 04/03/2018 CLINICAL DATA:  Nasogastric tube placement. EXAM: PORTABLE CHEST 1 VIEW COMPARISON:  Chest radiograph April 03, 2018 FINDINGS: Cardiomediastinal silhouette is normal. No pleural effusions or focal consolidations. Trachea projects midline and there is no pneumothorax. Soft tissue planes and included osseous structures are non-suspicious. Nasogastric tube past proximal stomach, distal tip out of field of view. IMPRESSION: Nasogastric tube past proximal stomach. No acute cardiopulmonary process. Electronically Signed   By: Elon Alas M.D.   On: 04/03/2018 04:13   Dg Abd Acute W/chest  Result Date: 04/03/2018 CLINICAL DATA:  Mid epigastric pain.  Vomiting EXAM: DG ABDOMEN ACUTE W/ 1V CHEST COMPARISON:  Chest x-ray 12/24/2017  FINDINGS: Normal heart size and mediastinal contours. No acute infiltrate or edema. No effusion or pneumothorax. No acute osseous findings. Formed stool throughout the colon. Dilated small bowel loops with fluid levels concerning for bowel obstruction. No concerning mass effect or calcification. No pneumoperitoneum IMPRESSION: 1. Dilated small bowel suspicious for obstruction. 2. Diffuse formed stool. 3. Negative chest. Electronically Signed   By: Monte Fantasia M.D.   On: 04/03/2018 01:59    EKG: Independently reviewed. Sinus rhythm without any acute changes  Assessment/Plan Principal Problem:   Hematemesis Active Problems:   Essential hypertension, benign   History of gastric ulcer   Hx of pyloroplasty March 2019   Ileus, unspecified ([place])   Hypokalemia   Irritable bowel syndrome   Ileus ([place])     1. Ileus.  Patient does have chronic constipation and irritable bowel syndrome.  Start the patient on Dulcolax suppositories.  NG tube has not had significant output.  If she starts to move her bowels, this can likely be discontinued.  May benefit from being on Linzess.  Keep patient n.p.o. for now. 2. Hypokalemia.  Replace.  Check magnesium. 3. Hypertension.  Blood pressure currently stable.  Hold oral medications until bowel issues have improved. 4. Peptic ulcer disease.  Continue on PPI.  DVT prophylaxis: SCDs Code Status: Full code  Family Communication: No family present Disposition Plan: Discharge home once improved Consults called:   Admission status: Observation, MedSurg  Kathie Dike MD [facility] Pager [phone]  If 7PM-7AM, please contact night-coverage [email or web address] Password [facility]  04/03/2018, 9:25 AM

## 2018-04-03 NOTE — ED Triage Notes (Signed)
Pt in by [facility] with c/o severe abd pain and hematemesis.  Pt received 4 mg of zofran iv and 550 cc NS fluid bolus.   Pt states she took goodie powders approx 1 hour prior to the pain and hematemesis starting, states she took for dental pain.

## 2018-04-03 NOTE — ED Triage Notes (Signed)
Pt reports abd surgery 2 months ago.

## 2018-04-03 NOTE — Discharge Summary (Signed)
Physician Discharge Summary  [name] IRW:[id number] DOB: [birth year] DOA: 04/03/2018  PCP: Glenda Chroman, MD  Admit date: 04/03/2018 Discharge date: 04/03/2018  Admitted From: Home Disposition: Home  Recommendations for Outpatient Follow-up:  1. Follow up with PCP in 1-2 weeks 2. Please obtain BMP/CBC in one week 3. Follow-up with gastroenterology to discuss further management of IBS/constipation  Discharge Condition: Improved CODE STATUS: Full code Diet recommendation: Heart healthy  Brief/Interim Summary: 52 year old female with a history of peptic ulcer disease status post pyloroplasty, hypertension and irritable bowel syndrome presented to the hospital with complaints of abdominal pain.  She also had associated vomiting.  She had noticed abdominal distention beginning yesterday.  She reports having issues with chronic constipation.  Work-up in the ER with CT scan indicated ileus with some enteritis.  Patient initially had NG tube placed but did not have significant output.  After being admitted to the hospital, she had several very large bowel movements.  Her abdominal distention has significantly improved and she is no longer having any abdominal pain.  She is not having any further nausea or vomiting.  Patient did tolerate clear liquids, but was insistent on being discharged home.  She did not wish to advance her diet in the hospital and said that she would do it at home.  She is been advised to discuss her constipation/IBS issues with her gastroenterologist.  Will be kept on MiraLAX for now.  She is also been advised to abstain from using any NSAIDs.  Patient is otherwise stable for discharge.  Discharge Diagnoses:  Principal Problem:   Hematemesis Active Problems:   Essential hypertension, benign   History of gastric ulcer   Hx of pyloroplasty March 2019   Ileus, unspecified ([place])   Hypokalemia   Irritable bowel syndrome   Ileus [facility])    Discharge  Instructions  Discharge Instructions    Diet - low sodium heart healthy   Complete by:  As directed    Diet - low sodium heart healthy   Complete by:  As directed    Increase activity slowly   Complete by:  As directed    Increase activity slowly   Complete by:  As directed      Allergies as of 04/03/2018      Reactions   Hydrocodone Other (See Comments)   Nausea, feels hot and insomia   Sulfa Antibiotics Hives      Medication List    TAKE these medications   amLODipine 10 MG tablet Commonly known as:  NORVASC Take 10 mg by mouth every morning.   CENTRUM ADULTS Tabs Take 1 tablet by mouth daily.   esomeprazole 40 MG capsule Commonly known as:  NEXIUM Take 40 mg by mouth daily at 12 noon.   polyethylene glycol packet Commonly known as:  MIRALAX Take 17 g by mouth daily.   potassium chloride 10 MEQ tablet Commonly known as:  K-DUR,KLOR-CON Take 10 mEq by mouth every morning.   traMADol 50 MG tablet Commonly known as:  ULTRAM Take 1 tablet (50 mg total) by mouth 3 (three) times daily as needed for moderate pain.   valACYclovir 1000 MG tablet Commonly known as:  VALTREX Take 1,000 mg by mouth daily.   vitamin B-12 1000 MCG tablet Commonly known as:  CYANOCOBALAMIN Take 1,000 mcg by mouth daily.       Allergies  Allergen Reactions  . Hydrocodone Other (See Comments)    Nausea, feels hot and insomia  . Sulfa Antibiotics Hives  Consultations:     Procedures/Studies: Ct Abdomen Pelvis W Contrast  Result Date: 04/03/2018 CLINICAL DATA:  Severe abdominal pain, hematemesis. History of hysterectomy, perforated duodenal ulcer. EXAM: CT ABDOMEN AND PELVIS WITH CONTRAST TECHNIQUE: Multidetector CT imaging of the abdomen and pelvis was performed using the standard protocol following bolus administration of intravenous contrast. CONTRAST:  136mL ISOVUE-300 IOPAMIDOL (ISOVUE-300) INJECTION 61% COMPARISON:  Abdominal radiograph April 03, 2018 and CT abdomen and  pelvis January 08, 2017 FINDINGS: LOWER CHEST: Lung bases are clear. Included heart size is normal. No pericardial effusion. Retained versus reflux contrast in distal esophagus. HEPATOBILIARY: Liver and gallbladder are normal. PANCREAS: Normal. SPLEEN: Normal. ADRENALS/URINARY TRACT: Kidneys are orthotopic, demonstrating symmetric enhancement. No nephrolithiasis, hydronephrosis or solid renal masses. Too small to characterize hypodensities bilateral kidneys. The unopacified ureters are normal in course and caliber. Delayed imaging through the kidneys demonstrates symmetric prompt contrast excretion within the proximal urinary collecting system. Urinary bladder is well distended and unremarkable. Normal adrenal glands. STOMACH/BOWEL: Fluid filled small bowel with mild mural hyperemia and thickening, multiple transition points including LEFT mid abdomen, RIGHT pelvis. Small and large bowel air-fluid levels. Moderate amount of mid to distal retained large bowel stool. Enteric contrast has not yet reached the distal small bowel. Contrast filled duodenal diverticula. Normal appendix. VASCULAR/LYMPHATIC: Aortoiliac vessels are normal in course and caliber. No lymphadenopathy by CT size criteria. REPRODUCTIVE: Status post hysterectomy. OTHER: Small amount of low-density ascites. No intraperitoneal free air. No focal fluid collections. MUSCULOSKELETAL: Nonacute. Mild broad levoscoliosis may be positional. Anterior abdominal wall scarring. IMPRESSION: 1. Enteritis and ileus.  Moderate retained large bowel stool. 2. Small amount of ascites. Electronically Signed   By: Elon Alas M.D.   On: 04/03/2018 05:22   Dg Chest Portable 1 View  Result Date: 04/03/2018 CLINICAL DATA:  Nasogastric tube placement. EXAM: PORTABLE CHEST 1 VIEW COMPARISON:  Chest radiograph April 03, 2018 FINDINGS: Cardiomediastinal silhouette is normal. No pleural effusions or focal consolidations. Trachea projects midline and there is no  pneumothorax. Soft tissue planes and included osseous structures are non-suspicious. Nasogastric tube past proximal stomach, distal tip out of field of view. IMPRESSION: Nasogastric tube past proximal stomach. No acute cardiopulmonary process. Electronically Signed   By: Elon Alas M.D.   On: 04/03/2018 04:13   Dg Abd Acute W/chest  Result Date: 04/03/2018 CLINICAL DATA:  Mid epigastric pain.  Vomiting EXAM: DG ABDOMEN ACUTE W/ 1V CHEST COMPARISON:  Chest x-ray 12/24/2017 FINDINGS: Normal heart size and mediastinal contours. No acute infiltrate or edema. No effusion or pneumothorax. No acute osseous findings. Formed stool throughout the colon. Dilated small bowel loops with fluid levels concerning for bowel obstruction. No concerning mass effect or calcification. No pneumoperitoneum IMPRESSION: 1. Dilated small bowel suspicious for obstruction. 2. Diffuse formed stool. 3. Negative chest. Electronically Signed   By: Monte Fantasia M.D.   On: 04/03/2018 01:59       Subjective: Patient is feeling significantly improved.  She had a large bowel movement.  Abdomen is no longer distended.  No nausea or vomiting.  Discharge Exam: Vitals:   04/03/18 0655 04/03/18 0823  BP: 111/66 108/61  Pulse: 92 78  Resp: (!) 22 18  Temp:  98.7 F (37.1 C)  SpO2: 96% 97%   Vitals:   04/03/18 0230 04/03/18 0300 04/03/18 0655 04/03/18 0823  BP: 126/77 134/88 111/66 108/61  Pulse: 96 96 92 78  Resp: 17 11 (!) 22 18  Temp:    98.7 F (37.1 C)  TempSrc:  Oral  SpO2: 97% 95% 96% 97%  Weight:    51.4 kg (113 lb 5.1 oz)  Height:    5\' 4"  (1.626 m)    General: Pt is alert, awake, not in acute distress Cardiovascular: RRR, S1/S2 +, no rubs, no gallops Respiratory: CTA bilaterally, no wheezing, no rhonchi Abdominal: Soft, NT, ND, bowel sounds + Extremities: no edema, no cyanosis    The results of significant diagnostics from this hospitalization (including imaging, microbiology, ancillary and  laboratory) are listed below for reference.     Microbiology: No results found for this or any previous visit (from the past 240 hour(s)).   Labs: BNP (last 3 results) No results for input(s): BNP in the last 8760 hours. Basic Metabolic Panel: Recent Labs  Lab 04/03/18 0153 04/03/18 0156  NA 140  --   K 3.1*  --   CL 109  --   CO2 24  --   GLUCOSE 94  --   BUN 16  --   CREATININE 0.56  --   CALCIUM 7.9*  --   MG  --  1.9   Liver Function Tests: Recent Labs  Lab 04/03/18 0153  AST 16  ALT 9  ALKPHOS 48  BILITOT 0.3  PROT 6.4*  ALBUMIN 3.5   No results for input(s): LIPASE, AMYLASE in the last 168 hours. No results for input(s): AMMONIA in the last 168 hours. CBC: Recent Labs  Lab 04/03/18 0153  WBC 14.5*  NEUTROABS 12.4*  HGB 13.8  HCT 42.1  MCV 102.9*  PLT 329   Cardiac Enzymes: No results for input(s): CKTOTAL, CKMB, CKMBINDEX, TROPONINI in the last 168 hours. BNP: Invalid input(s): POCBNP CBG: No results for input(s): GLUCAP in the last 168 hours. D-Dimer No results for input(s): DDIMER in the last 72 hours. Hgb A1c No results for input(s): HGBA1C in the last 72 hours. Lipid Profile No results for input(s): CHOL, HDL, LDLCALC, TRIG, CHOLHDL, LDLDIRECT in the last 72 hours. Thyroid function studies No results for input(s): TSH, T4TOTAL, T3FREE, THYROIDAB in the last 72 hours.  Invalid input(s): FREET3 Anemia work up No results for input(s): VITAMINB12, FOLATE, FERRITIN, TIBC, IRON, RETICCTPCT in the last 72 hours. Urinalysis    Component Value Date/Time   COLORURINE AMBER (A) 01/21/2017 0012   APPEARANCEUR HAZY (A) 01/21/2017 0012   LABSPEC 1.030 01/21/2017 0012   PHURINE 5.0 01/21/2017 0012   GLUCOSEU NEGATIVE 01/21/2017 0012   HGBUR SMALL (A) 01/21/2017 0012   BILIRUBINUR NEGATIVE 01/21/2017 0012   KETONESUR 5 (A) 01/21/2017 0012   PROTEINUR 30 (A) 01/21/2017 0012   UROBILINOGEN 0.2 04/15/2014 1238   NITRITE NEGATIVE 01/21/2017 0012    LEUKOCYTESUR NEGATIVE 01/21/2017 0012   Sepsis Labs Invalid input(s): PROCALCITONIN,  WBC,  LACTICIDVEN Microbiology No results found for this or any previous visit (from the past 240 hour(s)).   Time coordinating discharge: 62mins  SIGNED:   Kathie Dike, MD  [facility] 04/03/2018, 8:10 PM Pager   If 7PM-7AM, please contact night-coverage [email or web address] Password [email or web address]

## 2018-04-03 NOTE — ED Provider Notes (Signed)
[name] Provider Note   CSN: [id number] Arrival date & time: 04/03/18  0110     History   Chief Complaint Chief Complaint  Patient presents with  . Abdominal Pain  . Hematemesis    HPI [name] is a 52 y.o. female.  Patient presents to the emergency department for evaluation of severe abdominal pain.  Patient reports that she took a Goody powder for a toothache approximately an hour before onset of pain.  Patient then started to feel severe upper abdominal pain and then vomited.  She reports that there was "coffee grounds" when she vomited.  EMS administered fluids and Zofran, at arrival to the ER she is still experiencing severe abdominal pain.  She has not noticed any rectal bleeding or melena.  She reports that she does have a history of gastric ulcer.     Past Medical History:  Diagnosis Date  . Anxiety   . Depression   . Essential hypertension, benign 12/26/2016  . Fibroadenoma of right breast    S/P LUMPECTOMY W/ RADIOACTIVE SEED IMPLANT 05-19-2015  . Fibromyalgia   . GERD (gastroesophageal reflux disease)   . History of cervical dysplasia    s/p conzation and recurrent s/p  vaginal hysterectomy 2008  . History of duodenal ulcer    1992--  S/P  REPAIR PERFORATED ULCER  . History of gastric ulcer    age 41  . Hypertension   . IBS (irritable bowel syndrome)   . VIN II (vulvar intraepithelial neoplasia II)   . Wears glasses   . Wears partial dentures    lower    Patient Active Problem List   Diagnosis Date Noted  . Ileus, unspecified ([place]) 04/03/2018  . Malnutrition of moderate degree 12/23/2017  . Hx of pyloroplasty March 2019 12/19/2017  . Loss of weight 10/30/2017  . DU (duodenal ulcer) 07/18/2017  . Peptic ulcer of stomach 07/18/2017  . Abdominal pain 01/21/2017  . History of gastric ulcer 01/21/2017  . Hypertension 01/21/2017  . Fibromyalgia 01/21/2017  . Essential hypertension, benign 12/26/2016  . Abdominal pain,  epigastric 12/26/2016  . 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 Fibroadenoma of right breast 05/19/2015    Past Surgical History:  Procedure Laterality Date  . BLADDER SLING PROCEDURE  2005  approx.  . CERVICAL CONIZATION W/BX  2006  approx  . CO2 LASER APPLICATION N/A 01/08/4080   Procedure: CO2 LASER OF THE VAGINA;  Surgeon: Everitt Amber, MD;  Location: [facility];  Service: Gynecology;  Laterality: N/A;  . ESOPHAGOGASTRODUODENOSCOPY N/A 01/21/2017   Procedure: ESOPHAGOGASTRODUODENOSCOPY (EGD);  Surgeon: Rogene Houston, MD;  [facility];  Service: Endoscopy;  Laterality: N/A;  . ESOPHAGOGASTRODUODENOSCOPY N/A 07/17/2017   Procedure: ESOPHAGOGASTRODUODENOSCOPY (EGD);  Surgeon: Rogene Houston, MD;  [facility];  Service: Endoscopy;  Laterality: N/A;  1:00  . ESOPHAGOGASTRODUODENOSCOPY N/A 10/16/2017   Procedure: ESOPHAGOGASTRODUODENOSCOPY (EGD)WITH DILATION;  Surgeon: Rogene Houston, MD;  [facility]: [facility];  Service: Endoscopy;  Laterality: N/A;  1030  . ESOPHAGOGASTRODUODENOSCOPY (EGD) WITH PROPOFOL N/A 01/23/2017   Procedure: ESOPHAGOGASTRODUODENOSCOPY (EGD) WITH PROPOFOL;  Surgeon: Rogene Houston, MD;  [facility];  Service: Endoscopy;  Laterality: N/A;  duodenal striture dilation  . LAPAROSCOPY N/A 12/19/2017   Procedure: LAPAROSCOPY, PYLOROPLASTY WITH Silvestre Gunner;  Surgeon: Johnathan Hausen, MD;  Location: [facility];  Service: General;  Laterality: N/A;  . RADIOACTIVE SEED GUIDED EXCISIONAL BREAST BIOPSY Right 05/19/2015  Procedure: EXCISIONAL RIGHT BREAST MASS WITH RADIOACTIVE SEED LOCALIZATION;  Surgeon: Fanny Skates, MD;  Location: [place];  Service: General;  Laterality: Right;  . REPAIR OF PERFORATED ULCER  1992   duodenal  . VAGINAL HYSTERECTOMY  11/13/2006     OB History   None      Home Medications    Prior to Admission medications   Medication Sig Start Date End Date  Taking? Authorizing Provider  amLODipine (NORVASC) 10 MG tablet Take 10 mg by mouth every morning.    Yes [provider]  esomeprazole (NEXIUM) 40 MG packet Take 40 mg by mouth daily before breakfast. 10/16/17  Yes Rehman, Mechele Dawley, MD  doxycycline (VIBRAMYCIN) 100 MG capsule Take 1 capsule (100 mg total) by mouth 2 (two) times daily. 03/01/18   Triplett, Tammy, PA-C  Multiple Vitamins-Minerals (CENTRUM ADULTS) TABS Take 1 tablet by mouth daily.     [provider]  potassium chloride (K-DUR,KLOR-CON) 10 MEQ tablet Take 10 mEq by mouth every morning.    [provider]  traMADol (ULTRAM) 50 MG tablet Take 1 tablet (50 mg total) by mouth 3 (three) times daily as needed for moderate pain. 10/16/17   Rehman, Mechele Dawley, MD  valACYclovir (VALTREX) 1000 MG tablet Take 1,000 mg by mouth daily. 07/26/16   [provider]  vitamin B-12 (CYANOCOBALAMIN) 1000 MCG tablet Take 1,000 mcg by mouth daily.    [provider]    Family History Family History  Problem Relation Age of Onset  . Hypertension Mother   . Clotting disorder Father   . Diabetes Father   . Hypertension Father   . Hypertension Sister   . Hypertension Brother   . Hypertension Maternal Aunt   . Hypertension Maternal Uncle   . Hypertension Paternal Aunt   . Hypertension Paternal Uncle     Social History Social History   Tobacco Use  . Smoking status: Current Every Day Smoker    Packs/day: 1.00    Years: 18.00    Pack years: 18.00    Types: Cigarettes  . Smokeless tobacco: Never Used  Substance Use Topics  . Alcohol use: No  . Drug use: No     Allergies   Hydrocodone and Sulfa antibiotics   Review of Systems Review of Systems  Gastrointestinal: Positive for abdominal pain, nausea and vomiting.  All other systems reviewed and are negative.    Physical Exam Updated Vital Signs BP 134/88   Pulse 96   Temp (!) 97.5 F (36.4 C) (Oral)   Resp 11   Ht 5\' 4"  (1.626 m)   Wt  47.6 kg (105 lb)   SpO2 95%   BMI 18.02 kg/m   Physical Exam  Constitutional: She is oriented to person, place, and time. She appears well-developed and well-nourished. No distress.  HENT:  Head: Normocephalic and atraumatic.  Right Ear: Hearing normal.  Left Ear: Hearing normal.  Nose: Nose normal.  Mouth/Throat: Oropharynx is clear and moist and mucous membranes are normal.  Eyes: Pupils are equal, round, and reactive to light. Conjunctivae and EOM are normal.  Neck: Normal range of motion. Neck supple.  Cardiovascular: Regular rhythm, S1 normal and S2 normal. Exam reveals no gallop and no friction rub.  No murmur heard. Pulmonary/Chest: Effort normal and breath sounds normal. No respiratory distress. She exhibits no tenderness.  Abdominal: Soft. Normal appearance and bowel sounds are normal. There is no hepatosplenomegaly. There is generalized tenderness. There is no rebound, no guarding,  no tenderness at McBurney's point and negative Murphy's sign. No hernia.  Musculoskeletal: Normal range of motion.  Neurological: She is alert and oriented to person, place, and time. She has normal strength. No cranial nerve deficit or sensory deficit. Coordination normal. GCS eye subscore is 4. GCS verbal subscore is 5. GCS motor subscore is 6.  Skin: Skin is warm, dry and intact. No rash noted. No cyanosis.  Psychiatric: She has a normal mood and affect. Her speech is normal and behavior is normal. Thought content normal.  Nursing note and vitals reviewed.    ED Treatments / Results  Labs (all labs ordered are listed, but only abnormal results are displayed) Labs Reviewed  CBC WITH DIFFERENTIAL/PLATELET - Abnormal; Notable for the following components:      Result Value   WBC 14.5 (*)    MCV 102.9 (*)    Neutro Abs 12.4 (*)    All other components within normal limits  COMPREHENSIVE METABOLIC PANEL - Abnormal; Notable for the following components:   Potassium 3.1 (*)    Calcium 7.9 (*)      Total Protein 6.4 (*)    All other components within normal limits  PROTIME-INR  SALICYLATE LEVEL  POC OCCULT BLOOD, ED    EKG EKG Interpretation  Date/Time:  Friday April 03 2018 01:19:55 EDT Ventricular Rate:  80 PR Interval:    QRS Duration: 100 QT Interval:  374 QTC Calculation: 432 R Axis:   38 Text Interpretation:  Sinus rhythm Low voltage, precordial leads Confirmed by Orpah Greek [phone]) on 04/03/2018 1:23:06 AM   Radiology Ct Abdomen Pelvis W Contrast  Result Date: 04/03/2018 CLINICAL DATA:  Severe abdominal pain, hematemesis. History of hysterectomy, perforated duodenal ulcer. EXAM: CT ABDOMEN AND PELVIS WITH CONTRAST TECHNIQUE: Multidetector CT imaging of the abdomen and pelvis was performed using the standard protocol following bolus administration of intravenous contrast. CONTRAST:  135mL ISOVUE-300 IOPAMIDOL (ISOVUE-300) INJECTION 61% COMPARISON:  Abdominal radiograph April 03, 2018 and CT abdomen and pelvis January 08, 2017 FINDINGS: LOWER CHEST: Lung bases are clear. Included heart size is normal. No pericardial effusion. Retained versus reflux contrast in distal esophagus. HEPATOBILIARY: Liver and gallbladder are normal. PANCREAS: Normal. SPLEEN: Normal. ADRENALS/URINARY TRACT: Kidneys are orthotopic, demonstrating symmetric enhancement. No nephrolithiasis, hydronephrosis or solid renal masses. Too small to characterize hypodensities bilateral kidneys. The unopacified ureters are normal in course and caliber. Delayed imaging through the kidneys demonstrates symmetric prompt contrast excretion within the proximal urinary collecting system. Urinary bladder is well distended and unremarkable. Normal adrenal glands. STOMACH/BOWEL: Fluid filled small bowel with mild mural hyperemia and thickening, multiple transition points including LEFT mid abdomen, RIGHT pelvis. Small and large bowel air-fluid levels. Moderate amount of mid to distal retained large bowel stool. Enteric  contrast has not yet reached the distal small bowel. Contrast filled duodenal diverticula. Normal appendix. VASCULAR/LYMPHATIC: Aortoiliac vessels are normal in course and caliber. No lymphadenopathy by CT size criteria. REPRODUCTIVE: Status post hysterectomy. OTHER: Small amount of low-density ascites. No intraperitoneal free air. No focal fluid collections. MUSCULOSKELETAL: Nonacute. Mild broad levoscoliosis may be positional. Anterior abdominal wall scarring. IMPRESSION: 1. Enteritis and ileus.  Moderate retained large bowel stool. 2. Small amount of ascites. Electronically Signed   By: Elon Alas M.D.   On: 04/03/2018 05:22   Dg Chest Portable 1 View  Result Date: 04/03/2018 CLINICAL DATA:  Nasogastric tube placement. EXAM: PORTABLE CHEST 1 VIEW COMPARISON:  Chest radiograph April 03, 2018 FINDINGS: Cardiomediastinal silhouette is normal. No pleural  effusions or focal consolidations. Trachea projects midline and there is no pneumothorax. Soft tissue planes and included osseous structures are non-suspicious. Nasogastric tube past proximal stomach, distal tip out of field of view. IMPRESSION: Nasogastric tube past proximal stomach. No acute cardiopulmonary process. Electronically Signed   By: Elon Alas M.D.   On: 04/03/2018 04:13   Dg Abd Acute W/chest  Result Date: 04/03/2018 CLINICAL DATA:  Mid epigastric pain.  Vomiting EXAM: DG ABDOMEN ACUTE W/ 1V CHEST COMPARISON:  Chest x-ray 12/24/2017 FINDINGS: Normal heart size and mediastinal contours. No acute infiltrate or edema. No effusion or pneumothorax. No acute osseous findings. Formed stool throughout the colon. Dilated small bowel loops with fluid levels concerning for bowel obstruction. No concerning mass effect or calcification. No pneumoperitoneum IMPRESSION: 1. Dilated small bowel suspicious for obstruction. 2. Diffuse formed stool. 3. Negative chest. Electronically Signed   By: Monte Fantasia M.D.   On: 04/03/2018 01:59     Procedures Procedures (including critical care time)  Medications Ordered in ED Medications  pantoprazole (PROTONIX) 80 mg in sodium chloride 0.9 % 100 mL IVPB (has no administration in time range)  pantoprazole (PROTONIX) 80 mg in sodium chloride 0.9 % 250 mL (0.32 mg/mL) infusion (has no administration in time range)  pantoprazole (PROTONIX) injection 40 mg (has no administration in time range)  sodium chloride 0.9 % bolus 1,000 mL (0 mLs Intravenous Stopped 04/03/18 0607)  HYDROmorphone (DILAUDID) injection 1 mg (1 mg Intravenous Given 04/03/18 0130)  metoCLOPramide (REGLAN) injection 10 mg (10 mg Intravenous Given 04/03/18 0313)  HYDROmorphone (DILAUDID) injection 1 mg (1 mg Intravenous Given 04/03/18 0334)  iopamidol (ISOVUE-300) 61 % injection 100 mL (100 mLs Intravenous Contrast Given 04/03/18 0453)     Initial Impression / Assessment and Plan / ED Course  I have reviewed the triage vital signs and the nursing notes.  Pertinent labs & imaging results that were available during my care of the patient were reviewed by me and considered in my medical decision making (see chart for details).     Patient presents to the ER with concerns over abdominal pain and hematemesis.  Patient did not see bright red blood with her vomit, thought she saw a "coffee grounds".  She has a history of gastric as well as duodenal ulcers in the past.  Patient was having a great deal of pain at arrival.  She did improve with some analgesia.  Plain film x-ray was suspicious for bowel obstruction.  Patient had an NG tube placed.  No evidence of any ongoing bleeding.  CT scan is more consistent with ileus and possible enteritis.  Patient still having a great deal of discomfort, will admit for further management.  Final Clinical Impressions(s) / ED Diagnoses   Final diagnoses:  Generalized abdominal pain  Ileus [facility])    ED Discharge Orders    None       Orpah Greek, MD 04/03/18 [phone]

## 2018-04-04 LAB — HIV ANTIBODY (ROUTINE TESTING W REFLEX): HIV Screen 4th Generation wRfx: NONREACTIVE

## 2018-04-17 ENCOUNTER — Ambulatory Visit (INDEPENDENT_AMBULATORY_CARE_PROVIDER_SITE_OTHER): Payer: Medicaid Other | Admitting: Internal Medicine

## 2018-04-17 ENCOUNTER — Encounter (INDEPENDENT_AMBULATORY_CARE_PROVIDER_SITE_OTHER): Payer: Self-pay | Admitting: Internal Medicine

## 2018-05-11 IMAGING — RF DG UGI W/ GASTROGRAFIN
14 of 21 series · 14 of 21 positions shown · IV contrast (agent unspecified)
Comparison: Upper GI dated 11/19/2017

CLINICAL DATA: Recent gastric outlet obstruction treated with
pyloroplasty.

EXAM:
WATER SOLUBLE UPPER GI SERIES
TECHNIQUE: Single-column upper GI series was performed using water soluble
contrast.
CONTRAST:  Water-soluble contrast

[Series 1: fluoro_barium singleshot_bw · 0.18mm/px · 1 of 1 slices shown (1 of 14)]
[im 1/1]
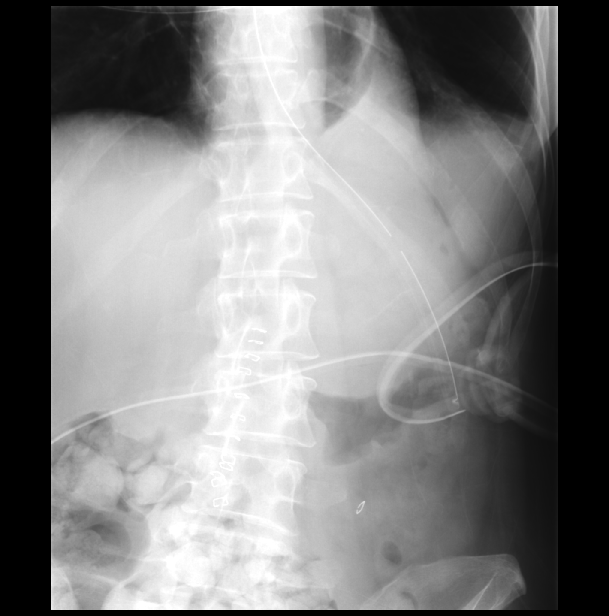

[Series 3: fluoro_barium singleshot_bw · 0.21mm/px · 1 of 1 slices shown (2 of 14)]
[im 1/1]
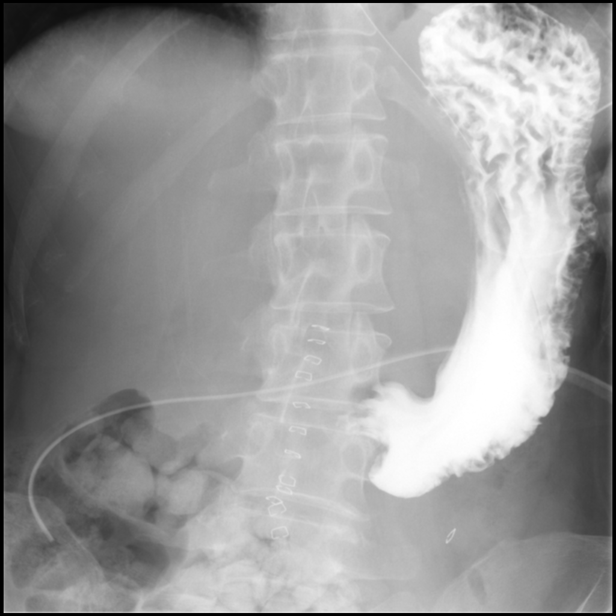

[Series 4: fluoro_barium singleshot_bw · 0.21mm/px · 1 of 1 slices shown (3 of 14)]
[im 1/1]
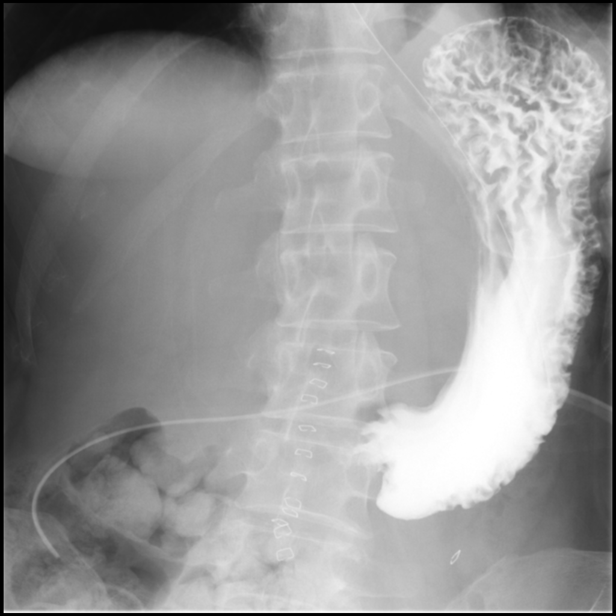

[Series 6: fluoro_barium singleshot_bw · 0.21mm/px · 1 of 1 slices shown (4 of 14)]
[im 1/1]
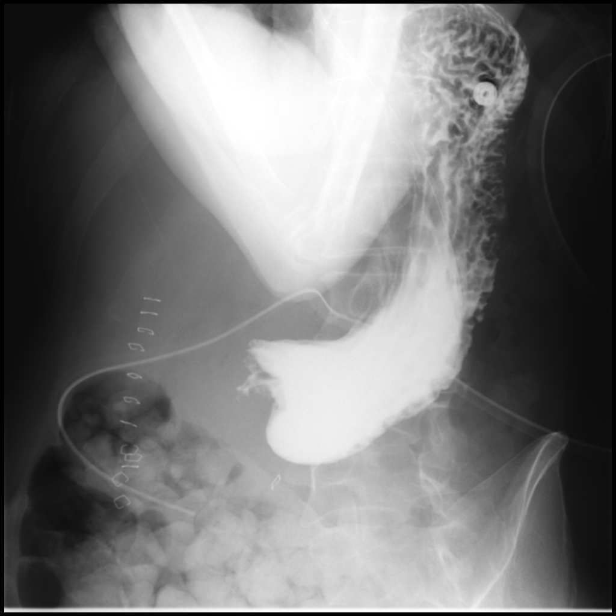

[Series 7: fluoro_barium singleshot_bw · 0.21mm/px · 1 of 1 slices shown (5 of 14)]
[im 1/1]
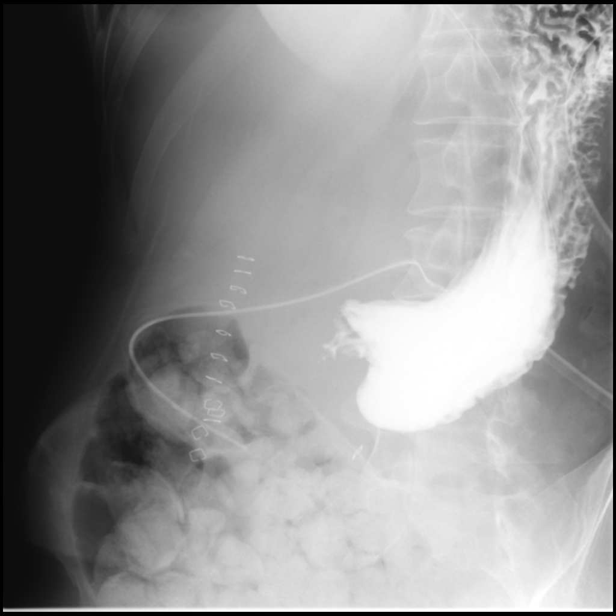

[Series 9: fluoro_barium singleshot_bw · 0.21mm/px · 1 of 1 slices shown (6 of 14)]
[im 1/1]
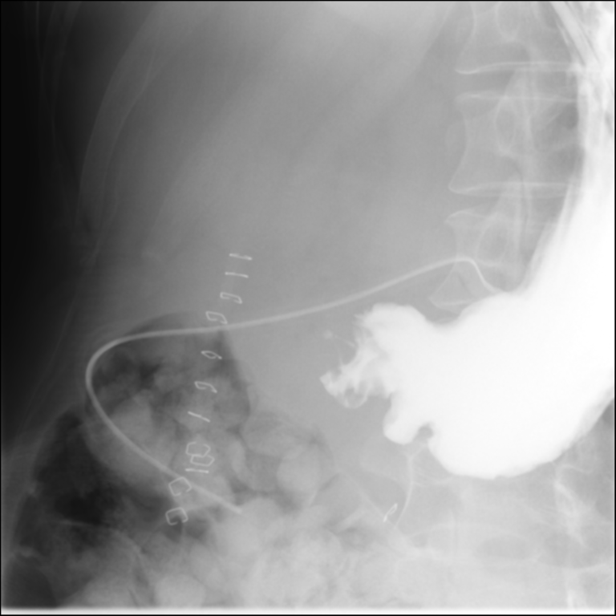

[Series 10: fluoro_barium singleshot_bw · 0.21mm/px · 1 of 1 slices shown (7 of 14)]
[im 1/1]
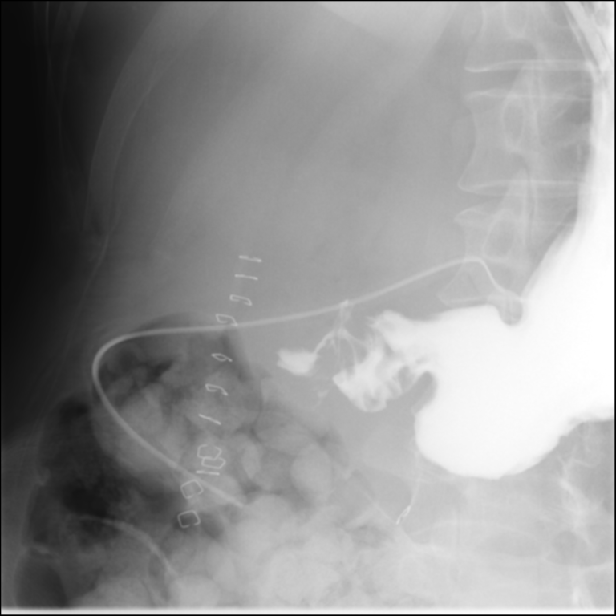

[Series 12: fluoro_barium singleshot_bw · 0.21mm/px · 1 of 1 slices shown (8 of 14)]
[im 1/1]
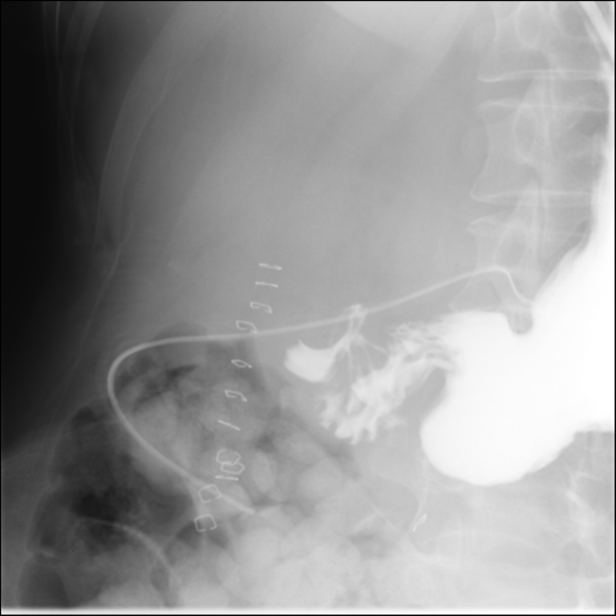

[Series 13: fluoro_barium singleshot_bw · 0.21mm/px · 1 of 1 slices shown (9 of 14)]
[im 1/1]
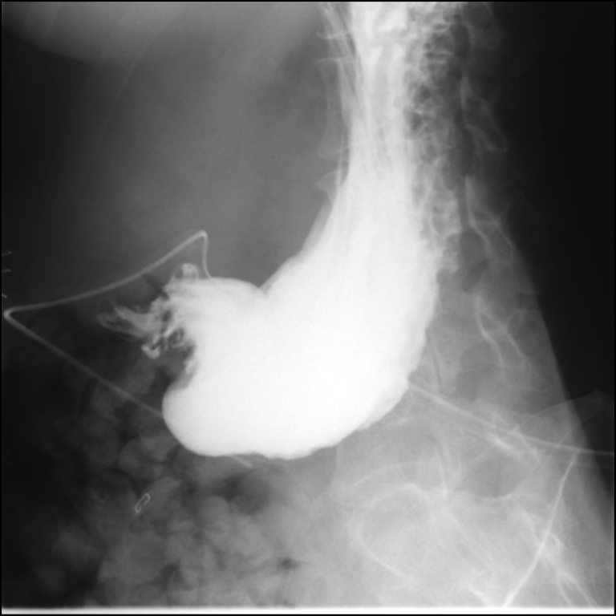

[Series 15: fluoro_barium singleshot_bw · 0.21mm/px · 1 of 1 slices shown (10 of 14)]
[im 1/1]
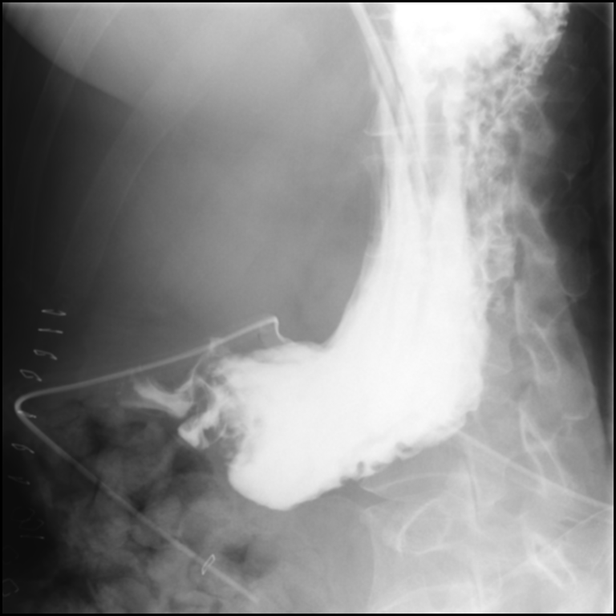

[Series 16: fluoro_barium singleshot_bw · 0.21mm/px · 1 of 1 slices shown (11 of 14)]
[im 1/1]
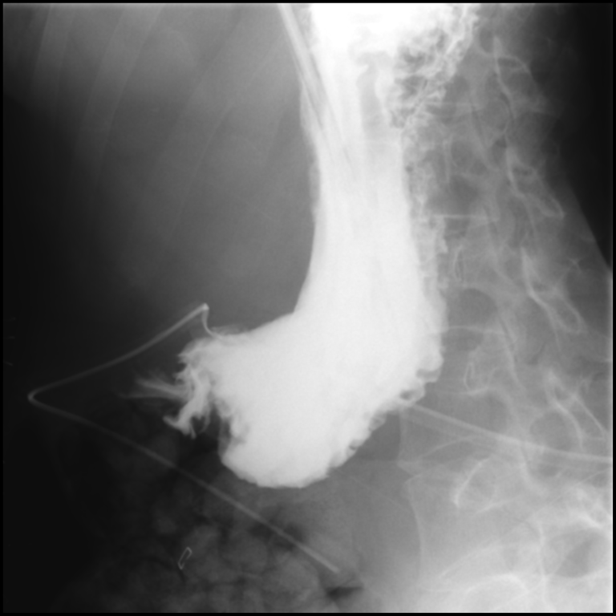

[Series 18: fluoro_barium singleshot_bw · 0.21mm/px · 1 of 1 slices shown (12 of 14)]
[im 1/1]
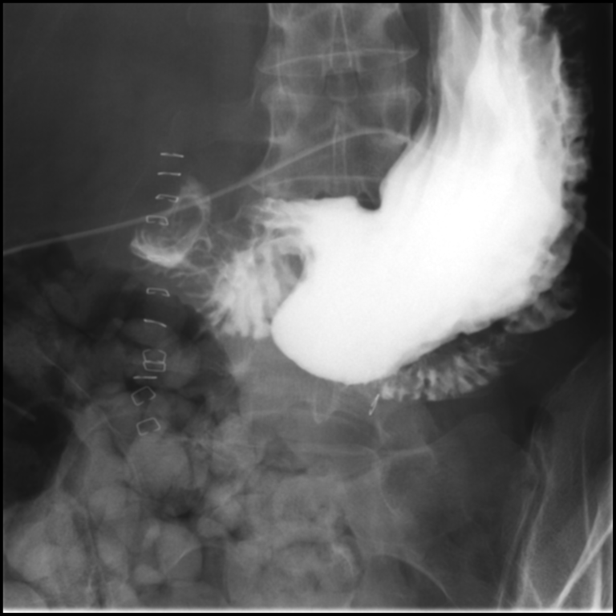

[Series 19: fluoro_barium singleshot_bw · 0.21mm/px · 1 of 1 slices shown (13 of 14)]
[im 1/1]
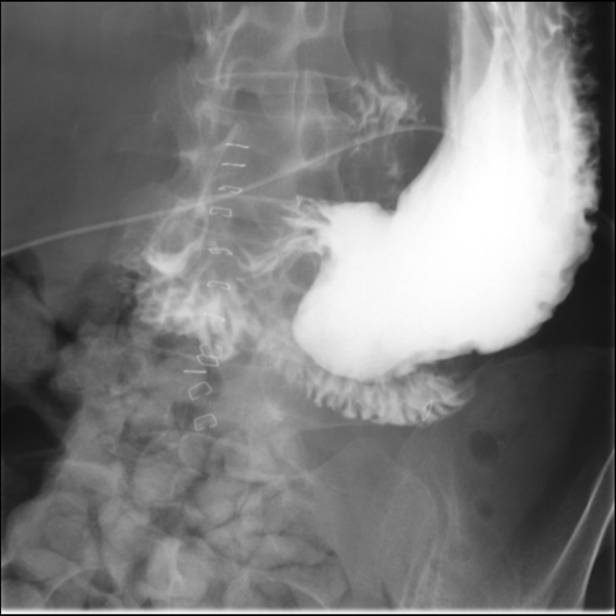

[Series 21: fluoro_barium singleshot_bw · 0.21mm/px · 1 of 1 slices shown (14 of 14)]
[im 1/1]
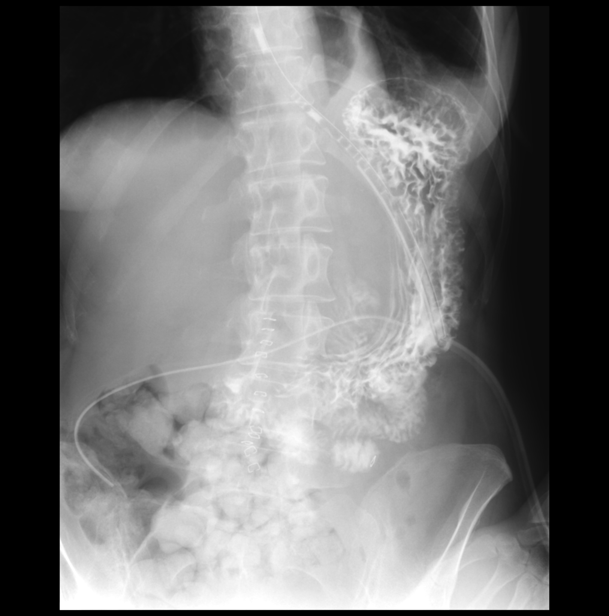

[14 of 21 positions shown; findings below may reference images not displayed]

FLUOROSCOPY TIME:  Fluoroscopy Time: 2.9 minutes

Radiation Exposure Index (if provided by the fluoroscopic device):
47.9 mGy
FINDINGS: Water-soluble contrast was instilled through the indwelling NG tube.
The patient was placed in a semi-erect position and then turned into
left and right lateral decubitus positions in semi erect and supine
position.

The water-soluble contrast slowly passed through the distorted
pylorus and duodenal bulb into the duodenum. There is some mucosal
edema or scarring in that area. However, contrast does pass into the
nondistended duodenum.

No evidence of contrast extravasation.

At the completion of the procedure the majority of the contrast was
aspirated from the stomach through the NG tube.

Note is made of what appears to be atypical atelectasis at the left
lung base posterior medially.
IMPRESSION: 1. No evidence of leakage from the site of pyloroplasty. There is
some postsurgical edema as well as chronic scarring at the pylorus.
2. Atelectasis at the left lung base posterior medially.

## 2018-05-13 IMAGING — DX DG CHEST 2V
2 series · 2 of 2 positions shown · non-contrast
Comparison: Chest x-ray dated January 21, 2017.

CLINICAL DATA: Fever.  Recent abdominal surgery.

EXAM:
CHEST - 2 VIEW

[chest pa]
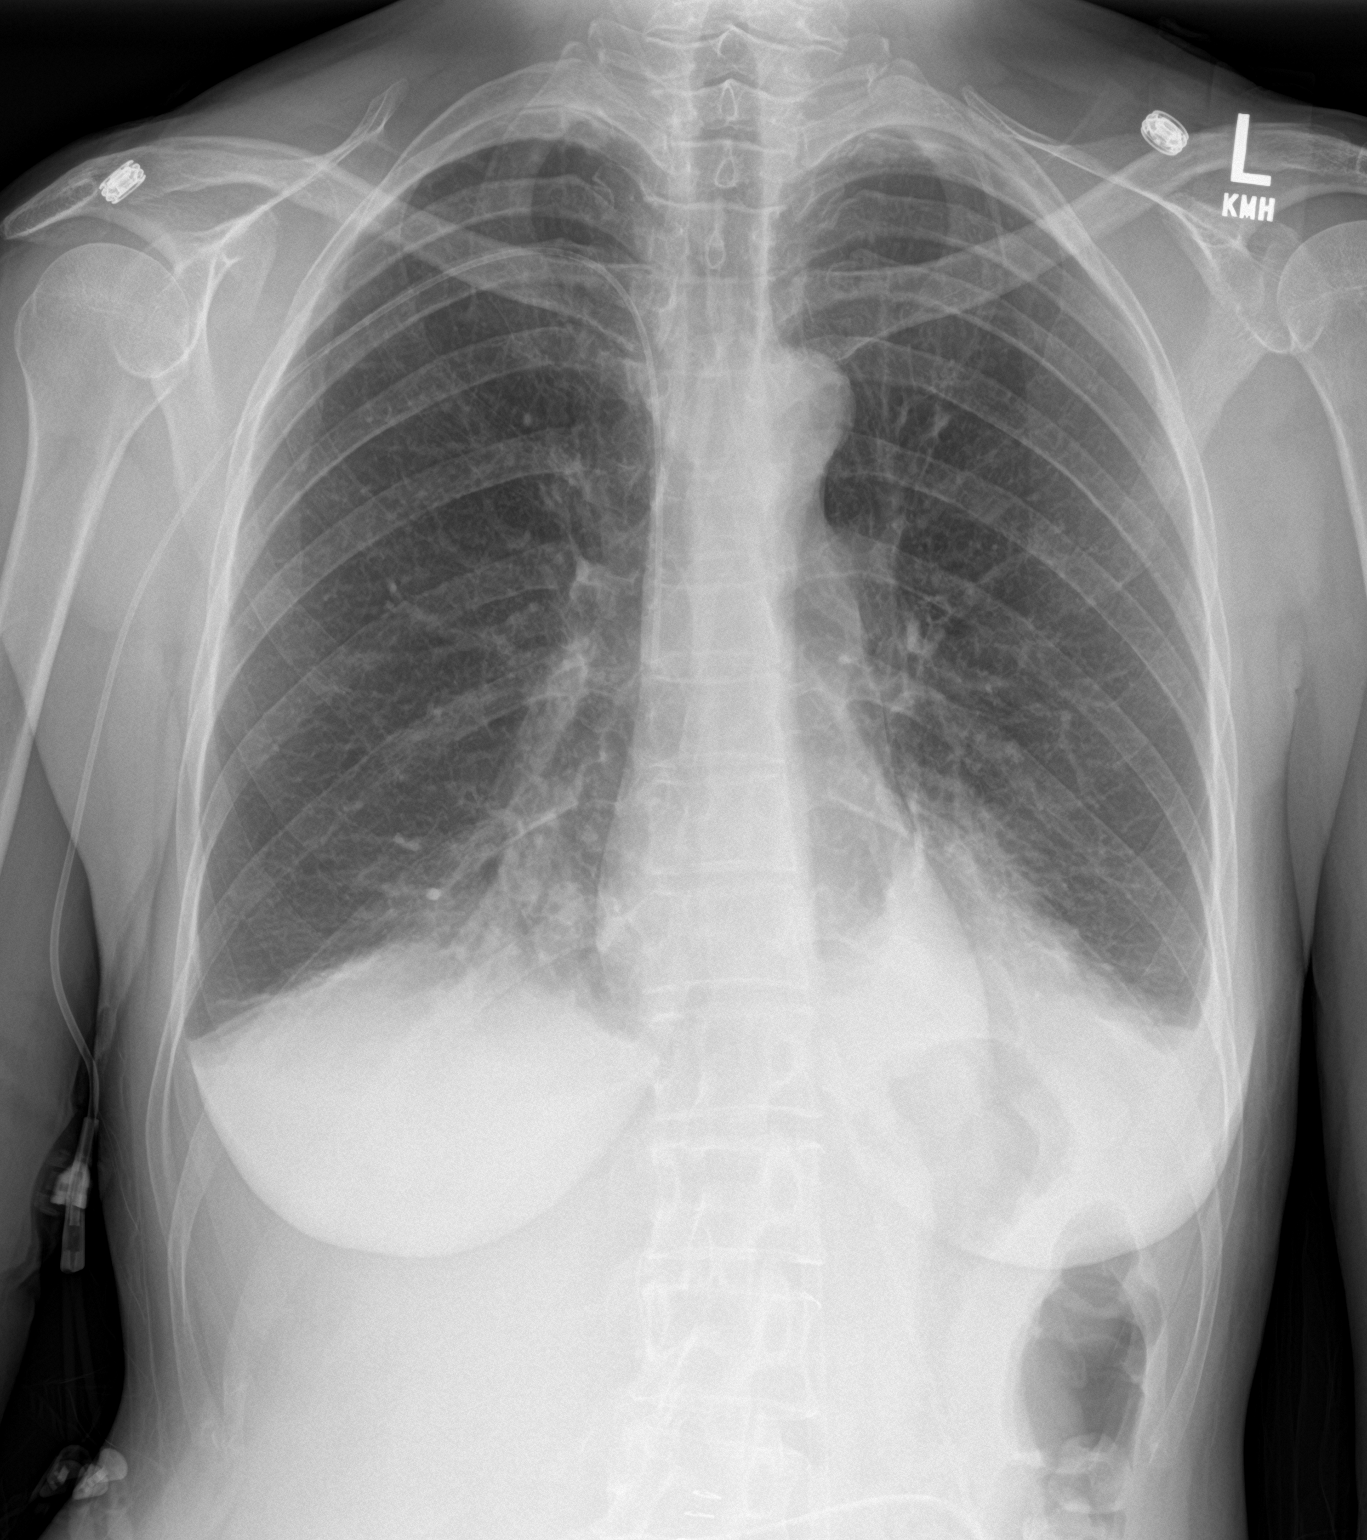

[chest lat]
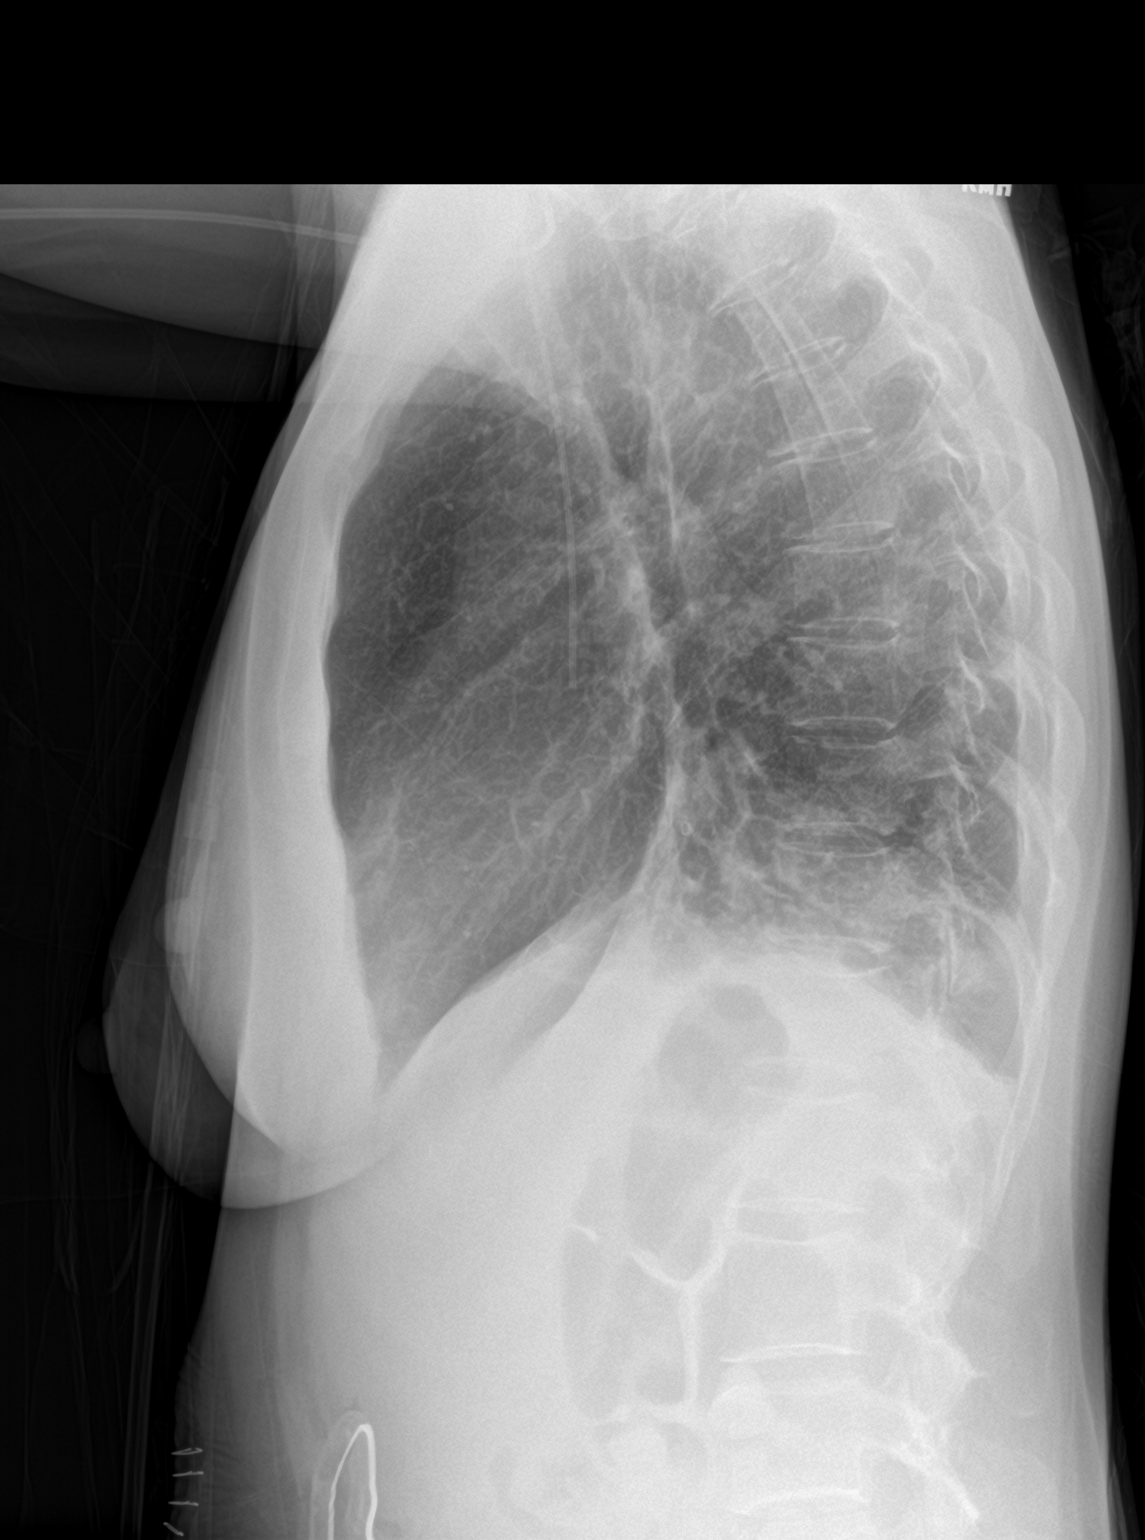

[2 of 2 positions shown; findings below may reference images not displayed]

FINDINGS: Right PICC line with the tip at the cavoatrial junction. The heart
size and mediastinal contours are within normal limits. Normal
pulmonary vascularity. Bibasilar airspace opacities, favored to
reflect atelectasis. Trace bilateral pleural effusions. No
consolidation or pneumothorax. No acute osseous abnormality.
IMPRESSION: 1. Relatively symmetric bibasilar airspace opacities are favored to
reflect atelectasis.
2. Trace bilateral pleural effusions.

## 2018-06-01 ENCOUNTER — Other Ambulatory Visit (INDEPENDENT_AMBULATORY_CARE_PROVIDER_SITE_OTHER): Payer: Self-pay | Admitting: Internal Medicine

## 2018-09-02 ENCOUNTER — Other Ambulatory Visit (INDEPENDENT_AMBULATORY_CARE_PROVIDER_SITE_OTHER): Payer: Self-pay | Admitting: Internal Medicine

## 2019-04-12 ENCOUNTER — Ambulatory Visit (INDEPENDENT_AMBULATORY_CARE_PROVIDER_SITE_OTHER): Payer: Medicaid Other | Admitting: Otolaryngology

## 2019-04-12 ENCOUNTER — Other Ambulatory Visit: Payer: Self-pay

## 2019-04-12 DIAGNOSIS — J310 Chronic rhinitis: Secondary | ICD-10-CM

## 2019-04-12 DIAGNOSIS — J343 Hypertrophy of nasal turbinates: Secondary | ICD-10-CM

## 2019-04-12 DIAGNOSIS — J342 Deviated nasal septum: Secondary | ICD-10-CM

## 2019-04-21 ENCOUNTER — Other Ambulatory Visit: Payer: Self-pay | Admitting: Otolaryngology

## 2019-04-22 ENCOUNTER — Other Ambulatory Visit: Payer: Self-pay

## 2019-04-22 ENCOUNTER — Encounter (HOSPITAL_BASED_OUTPATIENT_CLINIC_OR_DEPARTMENT_OTHER): Payer: Self-pay | Admitting: *Deleted

## 2019-04-27 ENCOUNTER — Other Ambulatory Visit: Payer: Self-pay

## 2019-04-27 ENCOUNTER — Other Ambulatory Visit (HOSPITAL_COMMUNITY): Payer: Medicaid Other

## 2019-04-27 ENCOUNTER — Other Ambulatory Visit (HOSPITAL_COMMUNITY)
Admission: RE | Admit: 2019-04-27 | Discharge: 2019-04-27 | Disposition: A | Discharge status: Home / Self Care | Payer: Medicaid Other | Source: Ambulatory Visit | Attending: Otolaryngology | Admitting: Otolaryngology

## 2019-04-27 ENCOUNTER — Encounter (HOSPITAL_BASED_OUTPATIENT_CLINIC_OR_DEPARTMENT_OTHER)
Admission: RE | Admit: 2019-04-27 | Discharge: 2019-04-27 | Disposition: A | Discharge status: Home / Self Care | Payer: Medicaid Other | Source: Ambulatory Visit | Attending: Otolaryngology | Admitting: Otolaryngology

## 2019-04-27 DIAGNOSIS — Z20828 Contact with and (suspected) exposure to other viral communicable diseases: Secondary | ICD-10-CM | POA: Insufficient documentation

## 2019-04-27 DIAGNOSIS — Z01818 Encounter for other preprocedural examination: Secondary | ICD-10-CM | POA: Diagnosis not present

## 2019-04-27 LAB — BASIC METABOLIC PANEL
Anion gap: 12 (ref 5–15)
BUN: 10 mg/dL (ref 6–20)
CO2: 24 mmol/L (ref 22–32)
Calcium: 9.2 mg/dL (ref 8.9–10.3)
Chloride: 105 mmol/L (ref 98–111)
Creatinine, Ser: 0.89 mg/dL (ref 0.44–1.00)
GFR calc Af Amer: >60 mL/min (ref >60)
GFR calc non Af Amer: >60 mL/min (ref >60)
Glucose, Bld: 103 mg/dL — ABNORMAL HIGH (ref 70–99)
Potassium: 4.6 mmol/L (ref 3.5–5.1)
Sodium: 141 mmol/L (ref 135–145)

## 2019-04-27 LAB — SARS CORONAVIRUS 2 (TAT 6-24 HRS): SARS Coronavirus 2: NEGATIVE

## 2019-04-29 NOTE — Anesthesia Preprocedure Evaluation (Addendum)
Anesthesia Evaluation  Patient identified by MRN, date of birth, ID band Patient awake    Reviewed: Allergy & Precautions, NPO status , Patient's Chart, lab work & pertinent test results  History of Anesthesia Complications Negative for: history of anesthetic complications  Airway Mallampati: II  TM Distance: >3 FB Neck ROM: Full    Dental  (+) Partial Upper, Partial Lower, Dental Advisory Given   Pulmonary Current SmokerPatient did not abstain from smoking.,    Pulmonary exam normal        Cardiovascular hypertension, Pt. on medications (-) anginaNormal cardiovascular exam     Neuro/Psych PSYCHIATRIC DISORDERS Anxiety Depression    GI/Hepatic Neg liver ROS, PUD (duodenal ulcer), GERD  Medicated and Controlled,  Endo/Other  negative endocrine ROS  Renal/GU negative Renal ROS     Musculoskeletal  (+) Fibromyalgia -  Abdominal   Peds  Hematology   Anesthesia Other Findings Breast cancer  Reproductive/Obstetrics                           Anesthesia Physical  Anesthesia Plan  ASA: II  Anesthesia Plan: General   Post-op Pain Management:    Induction: Intravenous  PONV Risk Score and Plan: 3 and Ondansetron, Dexamethasone and Scopolamine patch - Pre-op  Airway Management Planned: Oral ETT  Additional Equipment:   Intra-op Plan:   Post-operative Plan: Extubation in OR  Informed Consent: I have reviewed the patients History and Physical, chart, labs and discussed the procedure including the risks, benefits and alternatives for the proposed anesthesia with the patient or authorized representative who has indicated his/her understanding and acceptance.     Dental advisory given  Plan Discussed with: CRNA and Anesthesiologist  Anesthesia Plan Comments:        Anesthesia Quick Evaluation

## 2019-04-30 ENCOUNTER — Encounter (HOSPITAL_BASED_OUTPATIENT_CLINIC_OR_DEPARTMENT_OTHER): Payer: Self-pay | Admitting: Emergency Medicine

## 2019-04-30 ENCOUNTER — Ambulatory Visit (HOSPITAL_BASED_OUTPATIENT_CLINIC_OR_DEPARTMENT_OTHER): Payer: Medicaid Other | Admitting: Anesthesiology

## 2019-04-30 ENCOUNTER — Other Ambulatory Visit: Payer: Self-pay

## 2019-04-30 ENCOUNTER — Ambulatory Visit (HOSPITAL_BASED_OUTPATIENT_CLINIC_OR_DEPARTMENT_OTHER)
Admission: RE | Admit: 2019-04-30 | Discharge: 2019-04-30 | Disposition: A | Discharge status: Home / Self Care | Payer: Medicaid Other | Source: Ambulatory Visit | Attending: Otolaryngology | Admitting: Otolaryngology

## 2019-04-30 ENCOUNTER — Encounter (HOSPITAL_BASED_OUTPATIENT_CLINIC_OR_DEPARTMENT_OTHER)
Admission: RE | Disposition: A | Discharge status: Home / Self Care | Payer: Self-pay | Source: Ambulatory Visit | Attending: Otolaryngology

## 2019-04-30 DIAGNOSIS — J342 Deviated nasal septum: Secondary | ICD-10-CM | POA: Insufficient documentation

## 2019-04-30 DIAGNOSIS — F17210 Nicotine dependence, cigarettes, uncomplicated: Secondary | ICD-10-CM | POA: Insufficient documentation

## 2019-04-30 DIAGNOSIS — J343 Hypertrophy of nasal turbinates: Secondary | ICD-10-CM | POA: Insufficient documentation

## 2019-04-30 DIAGNOSIS — I10 Essential (primary) hypertension: Secondary | ICD-10-CM | POA: Insufficient documentation

## 2019-04-30 DIAGNOSIS — M797 Fibromyalgia: Secondary | ICD-10-CM | POA: Diagnosis not present

## 2019-04-30 DIAGNOSIS — Z853 Personal history of malignant neoplasm of breast: Secondary | ICD-10-CM | POA: Insufficient documentation

## 2019-04-30 DIAGNOSIS — G4700 Insomnia, unspecified: Secondary | ICD-10-CM | POA: Diagnosis not present

## 2019-04-30 DIAGNOSIS — K589 Irritable bowel syndrome without diarrhea: Secondary | ICD-10-CM | POA: Diagnosis not present

## 2019-04-30 DIAGNOSIS — J3489 Other specified disorders of nose and nasal sinuses: Secondary | ICD-10-CM | POA: Insufficient documentation

## 2019-04-30 HISTORY — PX: NASAL SEPTOPLASTY W/ TURBINOPLASTY: SHX2070

## 2019-04-30 SURGERY — SEPTOPLASTY, NOSE, WITH NASAL TURBINATE REDUCTION
Anesthesia: General | Site: Nose | Laterality: Bilateral

## 2019-04-30 MED ORDER — ONDANSETRON HCL 4 MG/2ML IJ SOLN
INTRAMUSCULAR | Status: DC | PRN
  Administered 2019-04-30: 4 mg via INTRAVENOUS

## 2019-04-30 MED ORDER — OXYMETAZOLINE HCL 0.05 % NA SOLN
NASAL | Status: DC | PRN
  Administered 2019-04-30: 1

## 2019-04-30 MED ORDER — SCOPOLAMINE 1 MG/3DAYS TD PT72
MEDICATED_PATCH | TRANSDERMAL | Status: AC
  Filled 2019-04-30: qty 1

## 2019-04-30 MED ORDER — MIDAZOLAM HCL 2 MG/2ML IJ SOLN
INTRAMUSCULAR | Status: DC | PRN
  Administered 2019-04-30: 2 mg via INTRAVENOUS

## 2019-04-30 MED ORDER — FENTANYL CITRATE (PF) 100 MCG/2ML IJ SOLN
25.0000 ug | INTRAMUSCULAR | Status: DC | PRN
  Administered 2019-04-30 (×2): 25 ug via INTRAVENOUS
  Administered 2019-04-30: 50 ug via INTRAVENOUS
  Administered 2019-04-30: 25 ug via INTRAVENOUS

## 2019-04-30 MED ORDER — ACETAMINOPHEN 500 MG PO TABS
1000.0000 mg | ORAL_TABLET | Freq: Once | ORAL | Status: AC
  Administered 2019-04-30: 1000 mg via ORAL

## 2019-04-30 MED ORDER — PROMETHAZINE HCL 25 MG/ML IJ SOLN: 6.2500 mg | INTRAMUSCULAR | Status: DC | PRN

## 2019-04-30 MED ORDER — OXYCODONE-ACETAMINOPHEN 5-325 MG PO TABS: 1.0000 | ORAL_TABLET | ORAL | 0 refills | Status: AC | PRN

## 2019-04-30 MED ORDER — ROCURONIUM BROMIDE 50 MG/5ML IV SOSY
PREFILLED_SYRINGE | INTRAVENOUS | Status: DC | PRN
  Administered 2019-04-30: 70 mg via INTRAVENOUS

## 2019-04-30 MED ORDER — FENTANYL CITRATE (PF) 100 MCG/2ML IJ SOLN
INTRAMUSCULAR | Status: AC
  Filled 2019-04-30: qty 2

## 2019-04-30 MED ORDER — FENTANYL CITRATE (PF) 100 MCG/2ML IJ SOLN
INTRAMUSCULAR | Status: DC | PRN
  Administered 2019-04-30 (×4): 50 ug via INTRAVENOUS

## 2019-04-30 MED ORDER — PHENYLEPHRINE 40 MCG/ML (10ML) SYRINGE FOR IV PUSH (FOR BLOOD PRESSURE SUPPORT)
PREFILLED_SYRINGE | INTRAVENOUS | Status: AC
  Filled 2019-04-30: qty 10

## 2019-04-30 MED ORDER — CEFAZOLIN SODIUM 1 G IJ SOLR
INTRAMUSCULAR | Status: AC
  Filled 2019-04-30: qty 20

## 2019-04-30 MED ORDER — LIDOCAINE-EPINEPHRINE 1 %-1:100000 IJ SOLN
INTRAMUSCULAR | Status: DC | PRN
  Administered 2019-04-30: 4 mL

## 2019-04-30 MED ORDER — ROCURONIUM BROMIDE 10 MG/ML (PF) SYRINGE
PREFILLED_SYRINGE | INTRAVENOUS | Status: AC
  Filled 2019-04-30: qty 10

## 2019-04-30 MED ORDER — DEXAMETHASONE SODIUM PHOSPHATE 10 MG/ML IJ SOLN
INTRAMUSCULAR | Status: DC | PRN
  Administered 2019-04-30: 8 mg via INTRAVENOUS

## 2019-04-30 MED ORDER — SCOPOLAMINE 1 MG/3DAYS TD PT72
1.0000 | MEDICATED_PATCH | TRANSDERMAL | Status: DC
  Administered 2019-04-30: 1.5 mg via TRANSDERMAL

## 2019-04-30 MED ORDER — PROPOFOL 10 MG/ML IV BOLUS
INTRAVENOUS | Status: DC | PRN
  Administered 2019-04-30: 150 mg via INTRAVENOUS

## 2019-04-30 MED ORDER — HYDROMORPHONE HCL 1 MG/ML IJ SOLN
0.5000 mg | Freq: Once | INTRAMUSCULAR | Status: AC | PRN
  Administered 2019-04-30: 0.5 mg via INTRAVENOUS

## 2019-04-30 MED ORDER — LIDOCAINE 2% (20 MG/ML) 5 ML SYRINGE
INTRAMUSCULAR | Status: DC | PRN
  Administered 2019-04-30: 70 mg via INTRAVENOUS

## 2019-04-30 MED ORDER — LACTATED RINGERS IV SOLN
INTRAVENOUS | Status: DC
  Administered 2019-04-30 (×3): via INTRAVENOUS

## 2019-04-30 MED ORDER — OXYCODONE HCL 5 MG PO TABS
ORAL_TABLET | ORAL | Status: AC
  Filled 2019-04-30: qty 1

## 2019-04-30 MED ORDER — LIDOCAINE-EPINEPHRINE 1 %-1:100000 IJ SOLN
INTRAMUSCULAR | Status: AC
  Filled 2019-04-30: qty 1

## 2019-04-30 MED ORDER — OXYCODONE HCL 5 MG PO TABS
5.0000 mg | ORAL_TABLET | Freq: Once | ORAL | Status: AC | PRN
  Administered 2019-04-30: 5 mg via ORAL

## 2019-04-30 MED ORDER — AMOXICILLIN 875 MG PO TABS: 875.0000 mg | ORAL_TABLET | Freq: Two times a day (BID) | ORAL | 0 refills | Status: AC

## 2019-04-30 MED ORDER — MIDAZOLAM HCL 2 MG/2ML IJ SOLN
INTRAMUSCULAR | Status: AC
  Filled 2019-04-30: qty 2

## 2019-04-30 MED ORDER — SUGAMMADEX SODIUM 200 MG/2ML IV SOLN
INTRAVENOUS | Status: DC | PRN
  Administered 2019-04-30: 180 mg via INTRAVENOUS

## 2019-04-30 MED ORDER — LIDOCAINE 2% (20 MG/ML) 5 ML SYRINGE
INTRAMUSCULAR | Status: AC
  Filled 2019-04-30: qty 5

## 2019-04-30 MED ORDER — PHENYLEPHRINE 40 MCG/ML (10ML) SYRINGE FOR IV PUSH (FOR BLOOD PRESSURE SUPPORT)
PREFILLED_SYRINGE | INTRAVENOUS | Status: DC | PRN
  Administered 2019-04-30: 80 ug via INTRAVENOUS

## 2019-04-30 MED ORDER — CEFAZOLIN SODIUM-DEXTROSE 2-3 GM-%(50ML) IV SOLR
INTRAVENOUS | Status: DC | PRN
  Administered 2019-04-30: 2 g via INTRAVENOUS

## 2019-04-30 MED ORDER — ONDANSETRON HCL 4 MG/2ML IJ SOLN
INTRAMUSCULAR | Status: AC
  Filled 2019-04-30: qty 2

## 2019-04-30 MED ORDER — HYDROMORPHONE HCL 1 MG/ML IJ SOLN
INTRAMUSCULAR | Status: AC
  Filled 2019-04-30: qty 0.5

## 2019-04-30 MED ORDER — ACETAMINOPHEN 500 MG PO TABS
ORAL_TABLET | ORAL | Status: AC
  Filled 2019-04-30: qty 2

## 2019-04-30 MED ORDER — DEXAMETHASONE SODIUM PHOSPHATE 10 MG/ML IJ SOLN
INTRAMUSCULAR | Status: AC
  Filled 2019-04-30: qty 1

## 2019-04-30 SURGICAL SUPPLY — 33 items
ATTRACTOMAT 16X20 MAGNETIC DRP (DRAPES) IMPLANT
CANISTER SUCT 1200ML W/VALVE (MISCELLANEOUS) ×3 IMPLANT
COAGULATOR SUCT 8FR VV (MISCELLANEOUS) ×3 IMPLANT
COVER WAND RF STERILE (DRAPES) IMPLANT
DECANTER SPIKE VIAL GLASS SM (MISCELLANEOUS) IMPLANT
DRSG NASOPORE 8CM (GAUZE/BANDAGES/DRESSINGS) IMPLANT
DRSG TELFA 3X8 NADH (GAUZE/BANDAGES/DRESSINGS) IMPLANT
ELECT REM PT RETURN 9FT ADLT (ELECTROSURGICAL) ×3
ELECTRODE REM PT RTRN 9FT ADLT (ELECTROSURGICAL) ×1 IMPLANT
GLOVE BIO SURGEON STRL SZ7.5 (GLOVE) ×3 IMPLANT
GOWN STRL REUS W/ TWL LRG LVL3 (GOWN DISPOSABLE) ×2 IMPLANT
GOWN STRL REUS W/TWL LRG LVL3 (GOWN DISPOSABLE) ×4
NDL HYPO 25X1 1.5 SAFETY (NEEDLE) ×1 IMPLANT
NEEDLE HYPO 25X1 1.5 SAFETY (NEEDLE) ×3 IMPLANT
NS IRRIG 1000ML POUR BTL (IV SOLUTION) ×3 IMPLANT
PACK BASIN DAY SURGERY FS (CUSTOM PROCEDURE TRAY) ×3 IMPLANT
PACK ENT DAY SURGERY (CUSTOM PROCEDURE TRAY) ×3 IMPLANT
PAD DRESSING TELFA 3X8 NADH (GAUZE/BANDAGES/DRESSINGS) IMPLANT
SLEEVE SCD COMPRESS KNEE MED (MISCELLANEOUS) IMPLANT
SOLUTION BUTLER CLEAR DIP (MISCELLANEOUS) ×3 IMPLANT
SPLINT NASAL AIRWAY SILICONE (MISCELLANEOUS) IMPLANT
SPONGE GAUZE 2X2 8PLY STER LF (GAUZE/BANDAGES/DRESSINGS) ×1
SPONGE GAUZE 2X2 8PLY STRL LF (GAUZE/BANDAGES/DRESSINGS) ×2 IMPLANT
SPONGE NEURO XRAY DETECT 1X3 (DISPOSABLE) ×3 IMPLANT
SUT CHROMIC 4 0 P 3 18 (SUTURE) ×3 IMPLANT
SUT PLAIN 4 0 ~~LOC~~ 1 (SUTURE) ×3 IMPLANT
SUT PROLENE 3 0 PS 2 (SUTURE) IMPLANT
SUT VIC AB 4-0 P-3 18XBRD (SUTURE) IMPLANT
SUT VIC AB 4-0 P3 18 (SUTURE)
TOWEL GREEN STERILE FF (TOWEL DISPOSABLE) ×3 IMPLANT
TUBE SALEM SUMP 12R W/ARV (TUBING) IMPLANT
TUBE SALEM SUMP 16 FR W/ARV (TUBING) ×3 IMPLANT
YANKAUER SUCT BULB TIP NO VENT (SUCTIONS) ×3 IMPLANT

## 2019-04-30 NOTE — H&P (Signed)
Cc: Chronic nasal obstruction  HPI: The patient is a 53 year old female who presents today complaining of chronic nasal obstruction since 08/2018.  According to the patient, her symptoms started after she fell and hit her nose on the ground.  It resulted in significant nasal trauma.  Since the injury, she has noted increasing facial pressure and pain, mostly over the right side.  She also has difficulty breathing, worse on the right side.  The patient was treated with Flonase nasal spray and nasal saline irrigation without improvement in her symptoms.  She has a history of environmental allergies.  She self-treats with over-the-counter medications.  She has no previous history of ENT surgery.  Currently she denies any facial pain, fever, or visual change.    The patient's review of systems (constitutional, eyes, ENT, cardiovascular, respiratory, GI, musculoskeletal, skin, neurologic, psychiatric, endocrine, hematologic, allergic) is noted in the ROS questionnaire.  It is reviewed with the patient.  Family health history: Diabetes, heart disease.  Major events: Bladder and breast surgery.  Ongoing medical problems: Hypertension, ulcers, fibromyalgia, IBS, osteoarthritis, insomnia.  Social history: The patient is single. She smokes about 1 pack of cigarettes a day. She denies the use of alcohol or illegal drugs.   Exam: General: Communicates without difficulty, well nourished, no acute distress. Head: Normocephalic, no evidence injury, no tenderness, facial buttresses intact without stepoff. Face/sinus: No tenderness to palpation and percussion. Facial movement is normal and symmetric. Eyes: PERRL, EOMI. No scleral icterus, conjunctivae clear. Neuro: CN II exam reveals vision grossly intact.  No nystagmus at any point of gaze. Ears: Auricles well formed without lesions.  Ear canals are intact without mass or lesion.  No erythema or edema is appreciated.  The TMs are intact without fluid. Nose: External  evaluation reveals normal support and skin without lesions.  Dorsum is intact.  Anterior rhinoscopy reveals congested mucosa over anterior aspect of inferior turbinates and deviated septum.  No purulence noted. Oral:  Oral cavity and oropharynx are intact, symmetric, without erythema or edema.  Mucosa is moist without lesions. Neck: Full range of motion without pain.  There is no significant lymphadenopathy.  No masses palpable.  Thyroid bed within normal limits to palpation.  Parotid glands and submandibular glands equal bilaterally without mass.  Trachea is midline. Neuro:  CN 2-12 grossly intact. Gait normal.   Procedure:  Flexible Nasal Endoscopy: Risks, benefits, and alternatives of flexible endoscopy were explained to the patient.  Specific mention was made of the risk of throat numbness with difficulty swallowing, possible bleeding from the nose and mouth, and pain from the procedure.  The patient gave oral consent to proceed.  The nasal cavities were decongested and anesthetised with a combination of oxymetazoline and 4% lidocaine solution.  The flexible scope was inserted into the right nasal cavity.  Endoscopy of the inferior and middle meatus was performed.  The edematous mucosa was as described above.  No polyp, mass, or lesion was appreciated.  Severe NSD. Olfactory cleft was clear.  Nasopharynx was clear.  Turbinates were hypertrophied but without mass.  Incomplete response to decongestion.  The procedure was repeated on the contralateral side with similar findings.  The patient tolerated the procedure well.  Instructions were given to avoid eating or drinking for 2 hours.   Assessment 1.  Severe nasal septal deviation to the right, and bilateral inferior turbinate hypertrophy, causing bilateral nasal obstruction.  The obstruction is worse on the right side.  2.  No polyps, mass, lesion or purulent  drainage is noted today.   Plan 1.  The physical exam and nasal endoscopy findings are reviewed  with the patient.  2.  She should continue with her Flonase nasal spray daily.   3.  In light of the severity of her septal deviation and nasal obstruction, she may benefit from surgical intervention with septoplasty and bilateral turbinate reduction.  The risks, benefits, alternatives and details of the procedures are reviewed with the patient.  Questions are invited and answered.  4.  The patient would like to proceed with the procedures.

## 2019-04-30 NOTE — Anesthesia Postprocedure Evaluation (Signed)
Anesthesia Post Note  Patient: [name]  Procedure(s) Performed: BILATERAL NASAL SEPTOPLASTY WITH TURBINATE REDUCTION (Bilateral Nose)     Patient location during evaluation: PACU Anesthesia Type: General Level of consciousness: sedated Pain management: pain level controlled Vital Signs Assessment: post-procedure vital signs reviewed and stable Respiratory status: spontaneous breathing and respiratory function stable Cardiovascular status: stable Postop Assessment: no apparent nausea or vomiting Anesthetic complications: no    Last Vitals:  Vitals:   04/30/19 1130 04/30/19 1147  BP: 114/71 129/72  Pulse: (!) 102 94  Resp: 15 16  Temp:  36.7 C  SpO2: 94% 94%    Last Pain:  Vitals:   04/30/19 1147  TempSrc: Oral  PainSc: 3                  Dewana Ammirati DANIEL

## 2019-04-30 NOTE — Op Note (Signed)
DATE OF PROCEDURE: 04/30/2019  OPERATIVE REPORT   SURGEON: Leta Baptist, MD   PREOPERATIVE DIAGNOSES:  1. Severe nasal septal deviation.  2. Bilateral inferior turbinate hypertrophy.  3. Chronic nasal obstruction.  POSTOPERATIVE DIAGNOSES:  1. Severe nasal septal deviation.  2. Bilateral inferior turbinate hypertrophy.  3. Chronic nasal obstruction.  PROCEDURE PERFORMED:  1. Septoplasty.  2. Bilateral partial inferior turbinate resection.   ANESTHESIA: General endotracheal tube anesthesia.   COMPLICATIONS: None.   ESTIMATED BLOOD LOSS: 320 mL.   INDICATION FOR PROCEDURE: [name] is a 53 y.o. female with a history of chronic nasal obstruction. The patient was  treated with antihistamine, decongestant, and steroid nasal spray. However, the patient continued to be symptomatic. On examination, the patient was noted to have bilateral severe inferior turbinate hypertrophy and significant nasal septal deviation, causing significant nasal obstruction. Based on the above findings, the decision was made for the patient to undergo the above-stated procedures. The risks, benefits, alternatives, and details of the procedures were discussed with the patient. Questions were invited and answered. Informed consent was obtained.   DESCRIPTION OF PROCEDURE: The patient was taken to the operating room and placed supine on the operating table. General endotracheal tube anesthesia was administered by the anesthesiologist. The patient was positioned, and prepped and draped in the standard fashion for nasal surgery. Pledgets soaked with Afrin were placed in both nasal cavities for decongestion. The pledgets were subsequently removed.   Examination of the nasal cavity revealed a severe nasal septal deviationd. 1% lidocaine with 1:100,000 epinephrine was injected onto the nasal septum bilaterally. A hemitransfixion incision was made on the left side. The mucosal flap was carefully elevated on the left side. A  cartilaginous incision was made 1 cm superior to the caudal margin of the nasal septum. Mucosal flap was also elevated on the right side in the similar fashion. It should be noted that due to the severe septal deviation, the deviated portion of the cartilaginous and bony septum had to be removed in piecemeal fashion. Once the deviated portions were removed, a straight midline septum was achieved. The septum was then quilted with 4-0 plain gut sutures. The hemitransfixion incision was closed with interrupted 4-0 chromic sutures.   The inferior one half of both hypertrophied inferior turbinate was crossclamped with a Kelly clamp. The inferior one half of each inferior turbinate was then resected with a pair of cross cutting scissors. Hemostasis was achieved with a suction cautery device.  Doyle splints were applied to the nasal septum.  The care of the patient was turned over to the anesthesiologist. The patient was awakened from anesthesia without difficulty. The patient was extubated and transferred to the recovery room in good condition.   OPERATIVE FINDINGS: Severe nasal septal deviation and bilateral inferior turbinate hypertrophy.   SPECIMEN: None.   FOLLOWUP CARE: The patient be discharged home once she is awake and alert. The patient will be placed on Percocet 1 tablets p.o. q.4 hours p.r.n. pain, and amoxicillin 875 mg p.o. b.i.d. for 5 days. The patient will follow up in my office in 3 days for splint removal.   Donis Pinder Raynelle Bring, MD

## 2019-04-30 NOTE — Discharge Instructions (Addendum)
1,00mg  Tylenol taken at 8:00am - NO tylenol until 2:00pm if needed POSTOPERATIVE INSTRUCTIONS FOR PATIENTS HAVING NASAL OR SINUS OPERATIONS ACTIVITY: Restrict activity at home for the first two days, resting as much as possible. Light activity is best. You may usually return to work within a week. You should refrain from nose blowing, strenuous activity, or heavy lifting greater than 20lbs for a total of three weeks after your operation.  If sneezing cannot be avoided, sneeze with your mouth open. DISCOMFORT: You may experience a dull headache and pressure along with nasal congestion and discharge. These symptoms may be worse during the first week after the operation but may last as long as two to four weeks.  Please take Tylenol or the pain medication that has been prescribed for you. Do not take aspirin or aspirin containing medications since they may cause bleeding.  You may experience symptoms of post nasal drainage, nasal congestion, headaches and fatigue for two or three months after your operation.  BLEEDING: You may have some blood tinged nasal drainage for approximately two weeks after the operation.  The discharge will be worse for the first week.  Please call our office at [phone] or go to the nearest hospital emergency room if you experience any of the following: heavy, bright red blood from your nose or mouth that lasts longer than ten minutes or coughing up or vomiting bright red blood or blood clots. GENERAL CONSIDERATIONS: 1. A gauze dressing will be placed on your upper lip to absorb any drainage after the operation. You may need to change this several times a day.  If you do not have very much drainage, you may remove the dressing.  Remember that you may gently wipe your nose with a tissue and sniff in, but DO NOT blow your nose. 2. Please keep all of your postoperative appointments.  Your final results after the operation will depend on proper follow-up.  The initial visit is usually  four to seven days after the operation.  During this visit, the remaining nasal packing and internal septal splints will be removed.  Your nasal and sinus cavities will be cleaned.  During the second visit, your nasal and sinus cavities will be cleaned again. Have someone drive you to your first two postoperative appointments. We suggest that you take your prescribed pain medication about  hour prior to each of these two appointments.  3. How you care for your nose after the operation will influence the results that you obtain.  You should follow all directions, take your medication as prescribed, and call our office [phone] with any problems or questions. 4. You may be more comfortable sleeping with your head elevated on two pillows. 5. Do not take any medications that we have not prescribed or recommended. WARNING SIGNS: if any of the following should occur, please call our office: 1. Bright red bleeding which lasts more than 10 minutes. 2. Persistent fever greater than 102F. 3. Persistent vomiting. 4. Severe and constant pain that is not relieved by prescribed pain medication. 5. Trauma to the nose. 6. Rash or unusual side effects from any medicines.    Post Anesthesia Home Care Instructions  Activity: Get plenty of rest for the remainder of the day. A responsible individual must stay with you for 24 hours following the procedure.  For the next 24 hours, DO NOT: -Drive a car -Paediatric nurse -Drink alcoholic beverages -Take any medication unless instructed by your physician -Make any legal decisions or sign important papers.  Meals: Start with liquid foods such as gelatin or soup. Progress to regular foods as tolerated. Avoid greasy, spicy, heavy foods. If nausea and/or vomiting occur, drink only clear liquids until the nausea and/or vomiting subsides. Call your physician if vomiting continues.  Special Instructions/Symptoms: Your throat may feel dry or sore from the  anesthesia or the breathing tube placed in your throat during surgery. If this causes discomfort, gargle with warm salt water. The discomfort should disappear within 24 hours.  If you had a scopolamine patch placed behind your ear for the management of post- operative nausea and/or vomiting:  1. The medication in the patch is effective for 72 hours, after which it should be removed.  Wrap patch in a tissue and discard in the trash. Wash hands thoroughly with soap and water. 2. You may remove the patch earlier than 72 hours if you experience unpleasant side effects which may include dry mouth, dizziness or visual disturbances. 3. Avoid touching the patch. Wash your hands with soap and water after contact with the patch.

## 2019-04-30 NOTE — Transfer of Care (Signed)
Immediate Anesthesia Transfer of Care Note  Patient: [name]  Procedure(s) Performed: BILATERAL NASAL SEPTOPLASTY WITH TURBINATE REDUCTION (Bilateral Nose)  Patient Location: PACU  Anesthesia Type:General  Level of Consciousness: awake, alert , oriented and patient cooperative  Airway & Oxygen Therapy: Patient Spontanous Breathing and Patient connected to face mask oxygen  Post-op Assessment: Report given to RN and Post -op Vital signs reviewed and stable  Post vital signs: Reviewed and stable  Last Vitals:  Vitals Value Taken Time  BP 125/59 04/30/19 1024  Temp    Pulse 105 04/30/19 1025  Resp 23 04/30/19 1025  SpO2 100 % 04/30/19 1025  Vitals shown include unvalidated device data.  Last Pain:  Vitals:   04/30/19 0750  TempSrc: Oral  PainSc: 0-No pain         Complications: No apparent anesthesia complications

## 2019-04-30 NOTE — Anesthesia Procedure Notes (Signed)
Procedure Name: Intubation Date/Time: 04/30/2019 9:11 AM Performed by: Raenette Rover, CRNA Pre-anesthesia Checklist: Patient identified, Emergency Drugs available, Suction available and Patient being monitored Patient Re-evaluated:Patient Re-evaluated prior to induction Oxygen Delivery Method: Circle system utilized Preoxygenation: Pre-oxygenation with 100% oxygen Induction Type: IV induction Ventilation: Mask ventilation without difficulty Laryngoscope Size: Miller and 2 Grade View: Grade I Tube type: Oral Tube size: 7.0 mm Number of attempts: 1 Airway Equipment and Method: Stylet Placement Confirmation: ETT inserted through vocal cords under direct vision,  positive ETCO2,  CO2 detector and breath sounds checked- equal and bilateral Secured at: 21 cm Tube secured with: Tape Dental Injury: Teeth and Oropharynx as per pre-operative assessment

## 2019-05-03 ENCOUNTER — Ambulatory Visit (INDEPENDENT_AMBULATORY_CARE_PROVIDER_SITE_OTHER): Payer: Medicaid Other | Admitting: Otolaryngology

## 2019-05-03 ENCOUNTER — Encounter (HOSPITAL_BASED_OUTPATIENT_CLINIC_OR_DEPARTMENT_OTHER): Payer: Self-pay | Admitting: Otolaryngology

## 2019-05-03 ENCOUNTER — Other Ambulatory Visit: Payer: Self-pay

## 2019-05-13 ENCOUNTER — Ambulatory Visit (INDEPENDENT_AMBULATORY_CARE_PROVIDER_SITE_OTHER): Payer: Medicaid Other | Admitting: Otolaryngology

## 2019-06-24 ENCOUNTER — Ambulatory Visit (INDEPENDENT_AMBULATORY_CARE_PROVIDER_SITE_OTHER): Payer: Medicaid Other | Admitting: Otolaryngology

## 2019-06-28 ENCOUNTER — Ambulatory Visit (INDEPENDENT_AMBULATORY_CARE_PROVIDER_SITE_OTHER): Payer: Medicaid Other | Admitting: Otolaryngology

## 2019-06-28 ENCOUNTER — Other Ambulatory Visit: Payer: Self-pay

## 2019-06-28 DIAGNOSIS — J0101 Acute recurrent maxillary sinusitis: Secondary | ICD-10-CM

## 2019-06-28 DIAGNOSIS — R519 Headache, unspecified: Secondary | ICD-10-CM | POA: Diagnosis not present

## 2019-07-07 ENCOUNTER — Other Ambulatory Visit: Payer: Self-pay | Admitting: Otolaryngology

## 2019-07-07 ENCOUNTER — Other Ambulatory Visit (HOSPITAL_COMMUNITY): Payer: Self-pay | Admitting: Otolaryngology

## 2019-07-07 DIAGNOSIS — J329 Chronic sinusitis, unspecified: Secondary | ICD-10-CM

## 2019-07-08 ENCOUNTER — Other Ambulatory Visit (INDEPENDENT_AMBULATORY_CARE_PROVIDER_SITE_OTHER): Payer: Self-pay | Admitting: *Deleted

## 2019-07-08 MED ORDER — ESOMEPRAZOLE MAGNESIUM 40 MG PO CPDR: 40.0000 mg | DELAYED_RELEASE_CAPSULE | Freq: Every day | ORAL | 2 refills | Status: DC

## 2019-07-23 ENCOUNTER — Ambulatory Visit (HOSPITAL_COMMUNITY)
Admission: RE | Admit: 2019-07-23 | Discharge: 2019-07-23 | Disposition: A | Discharge status: Home / Self Care | Payer: Medicaid Other | Source: Ambulatory Visit | Attending: Otolaryngology | Admitting: Otolaryngology

## 2019-07-23 ENCOUNTER — Other Ambulatory Visit: Payer: Self-pay

## 2019-07-23 DIAGNOSIS — J329 Chronic sinusitis, unspecified: Secondary | ICD-10-CM | POA: Diagnosis not present

## 2019-10-06 ENCOUNTER — Other Ambulatory Visit (INDEPENDENT_AMBULATORY_CARE_PROVIDER_SITE_OTHER): Payer: Self-pay | Admitting: Internal Medicine

## 2019-10-07 NOTE — Telephone Encounter (Signed)
Last seen 2 years ago-will refill for 1 month, recommend OV for further refills or to get medication from PCP.   Please schedule appt when able - thanks.

## 2019-10-18 ENCOUNTER — Encounter (INDEPENDENT_AMBULATORY_CARE_PROVIDER_SITE_OTHER): Payer: Self-pay

## 2019-10-28 ENCOUNTER — Ambulatory Visit (INDEPENDENT_AMBULATORY_CARE_PROVIDER_SITE_OTHER): Payer: Medicaid Other | Admitting: Gastroenterology

## 2019-11-08 ENCOUNTER — Telehealth (INDEPENDENT_AMBULATORY_CARE_PROVIDER_SITE_OTHER): Payer: Self-pay | Admitting: Gastroenterology

## 2019-11-08 MED ORDER — ESOMEPRAZOLE MAGNESIUM 40 MG PO CPDR: 40.0000 mg | DELAYED_RELEASE_CAPSULE | Freq: Every day | ORAL | 1 refills | Status: DC

## 2019-11-08 NOTE — Telephone Encounter (Signed)
Please notify patient she was last seen in February 2019. I sent refills to her pharmacy but she is overdue for yearly follow-up.

## 2019-11-29 ENCOUNTER — Ambulatory Visit (INDEPENDENT_AMBULATORY_CARE_PROVIDER_SITE_OTHER): Payer: Medicaid Other | Admitting: Internal Medicine

## 2019-11-30 ENCOUNTER — Ambulatory Visit (INDEPENDENT_AMBULATORY_CARE_PROVIDER_SITE_OTHER): Payer: Medicaid Other | Admitting: Internal Medicine

## 2019-12-22 ENCOUNTER — Encounter: Payer: Medicaid Other | Admitting: Adult Health

## 2019-12-27 ENCOUNTER — Encounter: Payer: Self-pay | Admitting: Adult Health

## 2019-12-27 ENCOUNTER — Ambulatory Visit (INDEPENDENT_AMBULATORY_CARE_PROVIDER_SITE_OTHER): Payer: Medicaid Other | Admitting: Adult Health

## 2019-12-27 ENCOUNTER — Other Ambulatory Visit: Payer: Self-pay

## 2019-12-27 VITALS — BP 140/89 | HR 83 | Ht 64.0 in | Wt 116.5 lb

## 2019-12-27 DIAGNOSIS — R61 Generalized hyperhidrosis: Secondary | ICD-10-CM | POA: Insufficient documentation

## 2019-12-27 DIAGNOSIS — B360 Pityriasis versicolor: Secondary | ICD-10-CM | POA: Insufficient documentation

## 2019-12-27 DIAGNOSIS — F32A Depression, unspecified: Secondary | ICD-10-CM

## 2019-12-27 DIAGNOSIS — R232 Flushing: Secondary | ICD-10-CM | POA: Diagnosis not present

## 2019-12-27 DIAGNOSIS — N6324 Unspecified lump in the left breast, lower inner quadrant: Secondary | ICD-10-CM | POA: Insufficient documentation

## 2019-12-27 DIAGNOSIS — F329 Major depressive disorder, single episode, unspecified: Secondary | ICD-10-CM

## 2019-12-27 DIAGNOSIS — Z780 Asymptomatic menopausal state: Secondary | ICD-10-CM | POA: Insufficient documentation

## 2019-12-27 MED ORDER — KETOCONAZOLE 2 % EX CREA: 1.0000 "application " | TOPICAL_CREAM | Freq: Every day | CUTANEOUS | 1 refills | Status: DC
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
## 2019-12-27 NOTE — Patient Instructions (Signed)
Tinea Versicolor  Tinea versicolor is a common fungal infection of the skin. It causes a rash that appears as light or dark patches on the skin. The rash most often occurs on the chest, back, neck, or upper arms. This condition is more common during warm weather. Other than affecting how your skin looks, tinea versicolor usually does not cause other problems. In most cases, the infection goes away in a few weeks with treatment. It may take a few months for the patches on your skin to return to your usual skin color. What are the causes? This condition occurs when a type of fungus that is normally present on the skin starts to overgrow. This fungus is a kind of yeast. The exact cause of the overgrowth is not known. This condition cannot be passed from one person to another (it is not contagious). What increases the risk? This condition is more likely to develop when certain factors are present, such as:  Heat and humidity.  Sweating too much.  Hormone changes.  Oily skin.  A weak disease-fighting system (immunesystem). What are the signs or symptoms? Symptoms of this condition include:  A rash of light or dark patches on your skin. The rash may have: ? Patches of tan or pink spots (on light skin). ? Patches of white or brown spots (on dark skin). ? Patches of skin that do not tan. ? Well-marked edges. ? Scales on the discolored areas.  Mild itching. How is this diagnosed? A health care provider can usually diagnose this condition by looking at your skin. During the exam, he or she may use ultraviolet (UV) light to see how much of your skin has been affected. In some cases, a skin sample may be taken by scraping the rash. This sample will be viewed under a microscope to check for yeast overgrowth. How is this treated? Treatment for this condition may include:  Dandruff shampoo that is applied to the affected skin during showers or bathing.  Over-the-counter medicated skin cream,  lotion, or soaps.  Prescription antifungal medicine in the form of skin cream or pills.  Medicine to help reduce itching. Follow these instructions at home:  Take over-the-counter and prescription medicines only as told by your health care provider.  Apply dandruff shampoo to the affected area if your health care provider told you to do that. You may be instructed to scrub the affected skin for several minutes each day.  Do not scratch the affected area of skin.  Avoid hot and humid conditions.  Do not use tanning booths.  Try to avoid sweating a lot. Contact a health care provider if:  Your symptoms get worse.  You have a fever.  You have redness, swelling, or pain at the site of your rash.  You have fluid or blood coming from your rash.  Your rash feels warm to the touch.  You have pus or a bad smell coming from your rash.  Your rash returns (recurs) after treatment. Summary  Tinea versicolor is a common fungal infection of the skin. It causes a rash that appears as light or dark patches on the skin.  The rash most often occurs on the chest, back, neck, or upper arms.  A health care provider can usually diagnose this condition by looking at your skin.  Treatment may include applying shampoo to the skin and taking or applying medicines. This information is not intended to replace advice given to you by your health care provider. Make sure you   discuss any questions you have with your health care provider. Document Revised: 08/22/2017 Document Reviewed: 05/13/2017 Elsevier Patient Education  2020 [employer]. Menopause Menopause is the normal time of life when menstrual periods stop completely. It is usually confirmed by 12 months without a menstrual period. The transition to menopause (perimenopause) most often happens between the ages of 44 and 65. During perimenopause, hormone levels change in your body, which can cause symptoms and affect your health. Menopause may  increase your risk for:  Loss of bone (osteoporosis), which causes bone breaks (fractures).  Depression.  Hardening and narrowing of the arteries (atherosclerosis), which can cause heart attacks and strokes. What are the causes? This condition is usually caused by a natural change in hormone levels that happens as you get older. The condition may also be caused by surgery to remove both ovaries (bilateral oophorectomy). What increases the risk? This condition is more likely to start at an earlier age if you have certain medical conditions or treatments, including:  A tumor of the pituitary gland in the brain.  A disease that affects the ovaries and hormone production.  Radiation treatment for cancer.  Certain cancer treatments, such as chemotherapy or hormone (anti-estrogen) therapy.  Heavy smoking and excessive alcohol use.  Family history of early menopause. This condition is also more likely to develop earlier in women who are very thin. What are the signs or symptoms? Symptoms of this condition include:  Hot flashes.  Irregular menstrual periods.  Night sweats.  Changes in feelings about sex. This could be a decrease in sex drive or an increased comfort around your sexuality.  Vaginal dryness and thinning of the vaginal walls. This may cause painful intercourse.  Dryness of the skin and development of wrinkles.  Headaches.  Problems sleeping (insomnia).  Mood swings or irritability.  Memory problems.  Weight gain.  Hair growth on the face and chest.  Bladder infections or problems with urinating. How is this diagnosed? This condition is diagnosed based on your medical history, a physical exam, your age, your menstrual history, and your symptoms. Hormone tests may also be done. How is this treated? In some cases, no treatment is needed. You and your health care provider should make a decision together about whether treatment is necessary. Treatment will be  based on your individual condition and preferences. Treatment for this condition focuses on managing symptoms. Treatment may include:  Menopausal hormone therapy (MHT).  Medicines to treat specific symptoms or complications.  Acupuncture.  Vitamin or herbal supplements. Before starting treatment, make sure to let your health care provider know if you have a personal or family history of:  Heart disease.  Breast cancer.  Blood clots.  Diabetes.  Osteoporosis. Follow these instructions at home: Lifestyle  Do not use any products that contain nicotine or tobacco, such as cigarettes and e-cigarettes. If you need help quitting, ask your health care provider.  Get at least 30 minutes of physical activity on 5 or more days each week.  Avoid alcoholic and caffeinated beverages, as well as spicy foods. This may help prevent hot flashes.  Get 7-8 hours of sleep each night.  If you have hot flashes, try: ? Dressing in layers. ? Avoiding things that may trigger hot flashes, such as spicy food, warm places, or stress. ? Taking slow, deep breaths when a hot flash starts. ? Keeping a fan in your home and office.  Find ways to manage stress, such as deep breathing, meditation, or journaling.  Consider  going to group therapy with other women who are having menopause symptoms. Ask your health care provider about recommended group therapy meetings. Eating and drinking  Eat a healthy, balanced diet that contains whole grains, lean protein, low-fat dairy, and plenty of fruits and vegetables.  Your health care provider may recommend adding more soy to your diet. Foods that contain soy include tofu, tempeh, and soy milk.  Eat plenty of foods that contain calcium and vitamin D for bone health. Items that are rich in calcium include low-fat milk, yogurt, beans, almonds, sardines, broccoli, and kale. Medicines  Take over-the-counter and prescription medicines only as told by your health care  provider.  Talk with your health care provider before starting any herbal supplements. If prescribed, take vitamins and supplements as told by your health care provider. These may include: ? Calcium. Women age 64 and older should get 1,200 mg (milligrams) of calcium every day. ? Vitamin D. Women need 600-800 International Units of vitamin D each day. ? Vitamins B12 and B6. Aim for 50 micrograms of B12 and 1.5 mg of B6 each day. General instructions  Keep track of your menstrual periods, including: ? When they occur. ? How heavy they are and how long they last. ? How much time passes between periods.  Keep track of your symptoms, noting when they start, how often you have them, and how long they last.  Use vaginal lubricants or moisturizers to help with vaginal dryness and improve comfort during sex.  Keep all follow-up visits as told by your health care provider. This is important. This includes any group therapy or counseling. Contact a health care provider if:  You are still having menstrual periods after age 19.  You have pain during sex.  You have not had a period for 12 months and you develop vaginal bleeding. Get help right away if:  You have: ? Severe depression. ? Excessive vaginal bleeding. ? Pain when you urinate. ? A fast or irregular heart beat (palpitations). ? Severe headaches. ? Abdomen (abdominal) pain or severe indigestion.  You fell and you think you have a broken bone.  You develop leg or chest pain.  You develop vision problems.  You feel a lump in your breast. Summary  Menopause is the normal time of life when menstrual periods stop completely. It is usually confirmed by 12 months without a menstrual period.  The transition to menopause (perimenopause) most often happens between the ages of 42 and 71.  Symptoms can be managed through medicines, lifestyle changes, and complementary therapies such as acupuncture.  Eat a balanced diet that is rich  in nutrients to promote bone health and heart health and to manage symptoms during menopause. This information is not intended to replace advice given to you by your health care provider. Make sure you discuss any questions you have with your health care provider. Document Revised: 08/22/2017 Document Reviewed: 10/12/2016 Elsevier Patient Education  2020 [employer].

## 2020-01-13 ENCOUNTER — Ambulatory Visit: Payer: Medicaid Other | Admitting: Adult Health

## 2020-01-14 ENCOUNTER — Ambulatory Visit: Payer: Medicaid Other | Admitting: Adult Health

## 2020-01-21 ENCOUNTER — Encounter: Payer: Self-pay | Admitting: Adult Health

## 2020-01-21 ENCOUNTER — Ambulatory Visit: Payer: Medicaid Other | Admitting: Adult Health

## 2020-01-21 ENCOUNTER — Other Ambulatory Visit: Payer: Self-pay

## 2020-01-21 VITALS — BP 138/89 | HR 80 | Ht 64.0 in | Wt 118.0 lb

## 2020-01-21 DIAGNOSIS — R61 Generalized hyperhidrosis: Secondary | ICD-10-CM | POA: Diagnosis not present

## 2020-01-21 DIAGNOSIS — F329 Major depressive disorder, single episode, unspecified: Secondary | ICD-10-CM | POA: Diagnosis not present

## 2020-01-21 DIAGNOSIS — F32A Depression, unspecified: Secondary | ICD-10-CM

## 2020-01-21 DIAGNOSIS — B360 Pityriasis versicolor: Secondary | ICD-10-CM | POA: Diagnosis not present

## 2020-01-21 DIAGNOSIS — R232 Flushing: Secondary | ICD-10-CM

## 2020-01-21 MED ORDER — ESTRADIOL 0.1 MG/24HR TD PTTW: 1.0000 | MEDICATED_PATCH | TRANSDERMAL | 12 refills | Status: DC

## 2020-01-21 MED ORDER — SERTRALINE HCL 50 MG PO TABS: 50.0000 mg | ORAL_TABLET | Freq: Every day | ORAL | 3 refills | Status: DC

## 2020-01-21 NOTE — Progress Notes (Signed)
  Subjective:     Patient ID: [name], female   DOB: 1966/02/10, 54 y.o.   MRN: [id number]  HPI [name] is a  54 year old white female,single, sp hysterectomy, back in follow up.She had mammogram at the [facility]. Complains os hot flashes and night sweats and is depressed. PCP is Dr Woody Seller  Review of Systems +hot flashes and night sweats +depressed Decreased libido Reviewed past medical,surgical, social and family history. Reviewed medications and allergies.     Objective:   Physical Exam BP 138/89 (BP Location: Left Arm, Patient Position: Sitting, Cuff Size: Normal)   Pulse 80   Ht 5\' 4"  (1.626 m)   Wt 118 lb (53.5 kg)   BMI 20.25 kg/m  Skin warm and dry.Tinea looks better. Lungs: clear to ausculation bilaterally. Cardiovascular: regular rate and rhythm.   PHQ 9 score is 21, no SI and she is willing to try meds.  Assessment:     1. Depression, unspecified depression type Will rx zoloft Meds ordered this encounter  Medications  . estradiol (VIVELLE-DOT) 0.1 MG/24HR patch    Sig: Place 1 patch (0.1 mg total) onto the skin 2 (two) times a week.    Dispense:  8 patch    Refill:  12    Order Specific Question:   Supervising Provider    Answer:   Elonda Husky, LUTHER H [[id number]]  . sertraline (ZOLOFT) 50 MG tablet    Sig: Take 1 tablet (50 mg total) by mouth daily.    Dispense:  30 tablet    Refill:  3    Order Specific Question:   Supervising Provider    Answer:   Tania Ade H [[id number]]  Follow up in 8 weeks   2. Hot flashes Will rx vivelle dot .  3. Night sweats Will rx vivelle dot  4. Tinea versicolor Continue nizoral cream     Plan:     Will get Daisy to call the [facility] center to send me copy of mammogram from 01/05/20

## 2020-03-22 ENCOUNTER — Ambulatory Visit: Payer: Medicaid Other | Admitting: Adult Health

## 2020-03-27 ENCOUNTER — Other Ambulatory Visit (INDEPENDENT_AMBULATORY_CARE_PROVIDER_SITE_OTHER): Payer: Self-pay | Admitting: Internal Medicine

## 2020-03-30 ENCOUNTER — Encounter (INDEPENDENT_AMBULATORY_CARE_PROVIDER_SITE_OTHER): Payer: Self-pay | Admitting: Gastroenterology

## 2020-03-30 ENCOUNTER — Ambulatory Visit (INDEPENDENT_AMBULATORY_CARE_PROVIDER_SITE_OTHER): Payer: Medicaid Other | Admitting: Gastroenterology

## 2020-03-30 ENCOUNTER — Ambulatory Visit: Payer: Medicaid Other | Admitting: Adult Health

## 2020-03-30 ENCOUNTER — Other Ambulatory Visit: Payer: Self-pay

## 2020-03-30 VITALS — BP 125/67 | HR 98 | Temp 98.6°F | Ht 63.0 in | Wt 117.4 lb

## 2020-03-30 DIAGNOSIS — R197 Diarrhea, unspecified: Secondary | ICD-10-CM

## 2020-03-30 MED ORDER — PANTOPRAZOLE SODIUM 40 MG PO TBEC: 40.0000 mg | DELAYED_RELEASE_TABLET | Freq: Two times a day (BID) | ORAL | 5 refills | Status: DC

## 2020-03-30 MED ORDER — DICYCLOMINE HCL 10 MG PO CAPS: 10.0000 mg | ORAL_CAPSULE | Freq: Three times a day (TID) | ORAL | 1 refills | Status: DC

## 2020-03-30 NOTE — Progress Notes (Signed)
Patient profile: [name] is a 54 y.o. female seen for f.up of dysphagia and epigastric pain.   History of Present Illness: [name] is seen today for follow up. Last seen in 2019 for endoscopy as below. She ultimately had a laparoscopy, pyloroplasty w/ graham patch for partial gastric outlet obstruction in March 2019.  She reports doing fairly well postop until around October 2020.  Last October developed dysphagia to foods and pills, this was a new symptom that developed after her nasal surgery. She has had severe dysphagia to rice and meats with them stopping in her upper esophageal area. Does have some occasinonal dysphagia to liquids as well.  Usually able to pass food with drinking soda.   Also since last October has developed epigastric pain radiating to back, she reports this is the same pain she had before her surgery in 2018 when had an ulcer, it resolved and returned when her mother was sick in October 2020.  Currently taking nexium 40mg  daily, does not feel this has helped symptoms.  Someties foods can help pain, milk can ease pain as well as Mylanta. Feeling GERD symptoms despite nexium 40mg  daily.  She denies significant nausea vomiting.    She reports 2 months of diarrhea, has tried imodium which stops temporairly then returns. Without imodium can have - up ot 5 BM/day, loose liquid stools. Denies blood in stool. pervioulsy had alternating constipation and diarrhea but over past 2 months diarrhea a lot more persistent. Foods such as popcorn can cause abd pain. Denies any med changes when diarrhea began. No infectious contacts.   Smokes 1.5-2PPD. No alcohol. Very rare nsaids.   Wt Readings from Last 3 Encounters:  03/30/20 117 lb 6.4 oz (53.3 kg)  01/21/20 118 lb (53.5 kg)  12/27/19 116 lb 8 oz (52.8 kg)     Last Colonoscopy: Per patient many years ago    Past Medical History:  Past Medical History:  Diagnosis Date   Anxiety    Depression    Essential  hypertension, benign 12/26/2016   Fibroadenoma of right breast    S/P LUMPECTOMY W/ RADIOACTIVE SEED IMPLANT 05-19-2015   Fibromyalgia    GERD (gastroesophageal reflux disease)    History of cervical dysplasia    s/p conzation and recurrent s/p  vaginal hysterectomy 2008   History of duodenal ulcer    1992--  S/P  REPAIR PERFORATED ULCER   History of gastric ulcer    age 31   Hypertension    IBS (irritable bowel syndrome)    Vaginal Pap smear, abnormal    VIN II (vulvar intraepithelial neoplasia II)    Wears glasses    Wears partial dentures    lower    Problem List: Patient Active Problem List   Diagnosis Date Noted   Night sweats 12/27/2019   Hot flashes 12/27/2019   Mass of lower inner quadrant of left breast 12/27/2019   Tinea versicolor 12/27/2019   Depression 12/27/2019   Menopause 12/27/2019   Ileus, unspecified ([place]) 04/03/2018   Hematemesis 04/03/2018   Hypokalemia 04/03/2018   Irritable bowel syndrome 04/03/2018   Ileus ([place]) 04/03/2018   Malnutrition of moderate degree 12/23/2017   Hx of pyloroplasty March 2019 12/19/2017   Loss of weight 10/30/2017   DU (duodenal ulcer) 07/18/2017   Peptic ulcer of stomach 07/18/2017   Abdominal pain 01/21/2017   History of gastric ulcer 01/21/2017   Hypertension 01/21/2017   Fibromyalgia 01/21/2017   Essential hypertension, benign 12/26/2016  Abdominal pain, epigastric 12/26/2016   VAIN II (vaginal intraepithelial neoplasia grade II) 06/13/2016   Fibroadenoma of right breast 05/19/2015    Past Surgical History: Past Surgical History:  Procedure Laterality Date   BLADDER SLING PROCEDURE  2005  approx.   CERVICAL CONIZATION W/BX  2006  approx   CO2 LASER APPLICATION N/A 3/79/0240   Procedure: CO2 LASER OF THE VAGINA;  Surgeon: Everitt Amber, MD;  Location: [facility];  Service: Gynecology;  Laterality: N/A;   ESOPHAGOGASTRODUODENOSCOPY N/A 01/21/2017   Procedure:  ESOPHAGOGASTRODUODENOSCOPY (EGD);  Surgeon: Rogene Houston, MD;  Location: [facility];  Service: Endoscopy;  Laterality: N/A;   ESOPHAGOGASTRODUODENOSCOPY N/A 07/17/2017   Procedure: ESOPHAGOGASTRODUODENOSCOPY (EGD);  Surgeon: Rogene Houston, MD;  Location: [facility];  Service: Endoscopy;  Laterality: N/A;  1:00   ESOPHAGOGASTRODUODENOSCOPY N/A 10/16/2017   Procedure: ESOPHAGOGASTRODUODENOSCOPY (EGD)WITH DILATION;  Surgeon: Rogene Houston, MD;  Location: [facility];  Service: Endoscopy;  Laterality: N/A;  1030   ESOPHAGOGASTRODUODENOSCOPY (EGD) WITH PROPOFOL N/A 01/23/2017   Procedure: ESOPHAGOGASTRODUODENOSCOPY (EGD) WITH PROPOFOL;  Surgeon: Rogene Houston, MD;  Location: [facility];  Service: Endoscopy;  Laterality: N/A;  duodenal striture dilation   LAPAROSCOPY N/A 12/19/2017   Procedure: LAPAROSCOPY, PYLOROPLASTY WITH Silvestre Gunner;  Surgeon: Johnathan Hausen, MD;  Location: [facility];  Service: General;  Laterality: N/A;   NASAL SEPTOPLASTY W/ TURBINOPLASTY Bilateral 04/30/2019   Procedure: BILATERAL NASAL SEPTOPLASTY WITH TURBINATE REDUCTION;  Surgeon: Leta Baptist, MD;  Location: [facility];  Service: ENT;  Laterality: Bilateral;   RADIOACTIVE SEED GUIDED EXCISIONAL BREAST BIOPSY Right 05/19/2015   Procedure: EXCISIONAL RIGHT BREAST MASS WITH RADIOACTIVE SEED LOCALIZATION;  Surgeon: Fanny Skates, MD;  Location: [facility];  Service: General;  Laterality: Right;   REPAIR OF PERFORATED ULCER  1992   duodenal   VAGINAL HYSTERECTOMY  11/13/2006    Allergies: Allergies  Allergen Reactions   Codeine    Hydrocodone Other (See Comments)    Nausea, feels hot and insomia   Sulfa Antibiotics Hives   Penicillin V Nausea Only      Home Medications:  Current Outpatient Medications:    estradiol (VIVELLE-DOT) 0.1 MG/24HR patch, Place 1 patch (0.1 mg total) onto the skin 2 (two) times a week., Disp: 8 patch, Rfl: 12   ketoconazole (NIZORAL) 2 %  cream, Apply 1 application topically daily., Disp: 30 g, Rfl: 1   Multiple Vitamins-Minerals (CENTRUM ADULTS) TABS, Take 1 tablet by mouth daily. , Disp: , Rfl:    potassium chloride (K-DUR,KLOR-CON) 10 MEQ tablet, Take 10 mEq by mouth every morning., Disp: , Rfl:    traMADol (ULTRAM) 50 MG tablet, tramadol 50 mg tablet  TAKE ONE TABLET BY MOUTH FOUR TIMES DAILY, Disp: , Rfl:    valACYclovir (VALTREX) 1000 MG tablet, Take 1,000 mg by mouth daily., Disp: , Rfl: 5   dicyclomine (BENTYL) 10 MG capsule, Take 1 capsule (10 mg total) by mouth 3 (three) times daily before meals., Disp: 90 capsule, Rfl: 1   pantoprazole (PROTONIX) 40 MG tablet, Take 1 tablet (40 mg total) by mouth 2 (two) times daily before a meal., Disp: 60 tablet, Rfl: 5   sertraline (ZOLOFT) 50 MG tablet, Take 1 tablet (50 mg total) by mouth daily. (Patient not taking: Reported on 03/30/2020), Disp: 30 tablet, Rfl: 3   Family History: family history includes Clotting disorder in her father; Diabetes in her father; Hypertension in her brother, father, maternal aunt, maternal uncle,  mother, paternal aunt, paternal uncle, and sister.    Social History:   reports that she has been smoking cigarettes. She has a 18.00 pack-year smoking history. She has never used smokeless tobacco. She reports that she does not drink alcohol and does not use drugs.   Review of Systems: Constitutional: Denies weight loss/weight gain  Eyes: No changes in vision. ENT: No oral lesions, sore throat.  GI: see HPI.  Heme/Lymph: No easy bruising.  CV: No chest pain.  GU: No hematuria.  Integumentary: No rashes.  Neuro: No headaches.  Psych: No depression/anxiety.  Endocrine: No heat/cold intolerance.  Allergic/Immunologic: No urticaria.  Resp: No cough, SOB.  Musculoskeletal: No joint swelling.    Physical Examination: BP 125/67 (BP Location: Right Arm, Patient Position: Sitting, Cuff Size: Normal)    Pulse 98    Temp 98.6 F (37 C) (Oral)    Ht  5\' 3"  (1.6 m)    Wt 117 lb 6.4 oz (53.3 kg)    BMI 20.80 kg/m  Gen: NAD, alert and oriented x 4 HEENT: PEERLA, EOMI, Neck: supple, no JVD Chest: CTA bilaterally, no wheezes, crackles, or other adventitious sounds CV: RRR, no m/g/c/r Abd: soft, NT, ND, +BS in all four quadrants; no HSM, guarding, ridigity, or rebound tenderness Ext: no edema, well perfused with 2+ pulses, Skin: no rash or lesions noted on observed skin Lymph: no noted LAD  Data Reviewed:   We will request her most recent labs from Dr. Woody Seller  Assessment/Plan: Ms. Cornell is a 54 y.o. female seen for follow-up  1.  Dysphagia-needs upper endoscopy with possible dilation for evaluation.  Most problematic foods are rice and meats.  In interim she will take small bites, liquids with food, etc.  2.  Epigastric pain-history of gastric ulcers.  She is on Nexium 40 mg and does not feel this is helping.  She reports being on Protonix in the past with more improvement in symptoms.  Will transition to Protonix.  We will also arrange upper endoscopy given above. Request PCP labs.   3.  Diarrhea-ongoing x2 months.  Prior with history of alternating constipation diarrhea.  Will check stool studies to exclude infection.  Will also give dicyclomine to use as needed.  Diet modifications discussed.  Patient denies CP, SOB, and use of blood thinners. I discussed the risks and benefits of procedure including bleeding, perforation, infection, missed lesions, medication reactions and possible hospitalization or surgery if complications. All questions answered.  She denies prior issues with sedation.  She requests propofol sedation.   Meilani was seen today for follow-up.  Diagnoses and all orders for this visit:  Diarrhea, unspecified type -     Gastrointestinal Pathogen Panel PCR -     C. difficile GDH and Toxin A/B  Other orders -     pantoprazole (PROTONIX) 40 MG tablet; Take 1 tablet (40 mg total) by mouth 2 (two) times daily before a  meal. -     dicyclomine (BENTYL) 10 MG capsule; Take 1 capsule (10 mg total) by mouth 3 (three) times daily before meals.      I personally performed the service, non-incident to. (WP)  Laurine Blazer, [facility]

## 2020-03-30 NOTE — Patient Instructions (Signed)
Stop nexium. Start protonix - twice a day 30 min before meals Dicyclomine as needed for abdominal pain and diarrhea  We are scheduling endoscopy and colonoscopy

## 2020-03-31 ENCOUNTER — Telehealth (INDEPENDENT_AMBULATORY_CARE_PROVIDER_SITE_OTHER): Payer: Self-pay | Admitting: *Deleted

## 2020-03-31 ENCOUNTER — Encounter (INDEPENDENT_AMBULATORY_CARE_PROVIDER_SITE_OTHER): Payer: Self-pay | Admitting: *Deleted

## 2020-03-31 MED ORDER — SUTAB 1479-225-188 MG PO TABS: 1.0000 | ORAL_TABLET | Freq: Once | ORAL | 0 refills | Status: AC

## 2020-03-31 NOTE — Telephone Encounter (Signed)
Patient needs Sutab 

## 2020-04-03 ENCOUNTER — Other Ambulatory Visit (INDEPENDENT_AMBULATORY_CARE_PROVIDER_SITE_OTHER): Payer: Self-pay | Admitting: *Deleted

## 2020-04-04 ENCOUNTER — Ambulatory Visit: Payer: Medicaid Other | Admitting: Adult Health

## 2020-04-11 ENCOUNTER — Ambulatory Visit: Payer: Medicaid Other | Admitting: Adult Health

## 2020-04-18 ENCOUNTER — Telehealth (INDEPENDENT_AMBULATORY_CARE_PROVIDER_SITE_OTHER): Payer: Self-pay | Admitting: Gastroenterology

## 2020-04-18 NOTE — Telephone Encounter (Signed)
Patient called wanted to let you know she is still having diarrhea - please advise - [phone]

## 2020-04-21 NOTE — Telephone Encounter (Signed)
She did not turn in her stool studies. Needs to turn in for evaluation to rule out infection. She also has colonoscopy scheduled in August for further evaluation. Thanks.

## 2020-04-24 NOTE — Telephone Encounter (Signed)
Patient called and a message was left on her voicemail about the stool studies.

## 2020-04-25 NOTE — Telephone Encounter (Signed)
Patient left voice mail message stating she has lost her paperwork for the stool studies - please advise - [phone]

## 2020-04-25 NOTE — Telephone Encounter (Signed)
I have reprinted the order for the patient if she wants to come and get it. She will need this to present to the Lab to get the correct bottles that will be needed to collect the stool in and take back for testing. Once this has been done. Thayer Headings will let her know the results as she rec's them.  Thank you

## 2020-04-28 ENCOUNTER — Encounter (HOSPITAL_COMMUNITY): Payer: Self-pay

## 2020-05-01 ENCOUNTER — Other Ambulatory Visit: Payer: Self-pay

## 2020-05-01 ENCOUNTER — Encounter (HOSPITAL_COMMUNITY): Payer: Self-pay

## 2020-05-01 ENCOUNTER — Other Ambulatory Visit (HOSPITAL_COMMUNITY)
Admission: RE | Admit: 2020-05-01 | Discharge: 2020-05-01 | Disposition: A | Discharge status: Home / Self Care | Payer: Medicaid Other | Source: Ambulatory Visit | Attending: Internal Medicine | Admitting: Internal Medicine

## 2020-05-01 ENCOUNTER — Encounter (HOSPITAL_COMMUNITY)
Admission: RE | Admit: 2020-05-01 | Discharge: 2020-05-01 | Disposition: A | Discharge status: Home / Self Care | Payer: Medicaid Other | Source: Ambulatory Visit | Attending: Internal Medicine | Admitting: Internal Medicine

## 2020-05-01 DIAGNOSIS — Z01812 Encounter for preprocedural laboratory examination: Secondary | ICD-10-CM | POA: Insufficient documentation

## 2020-05-01 DIAGNOSIS — Z20822 Contact with and (suspected) exposure to covid-19: Secondary | ICD-10-CM | POA: Diagnosis not present

## 2020-05-02 LAB — SARS CORONAVIRUS 2 (TAT 6-24 HRS): SARS Coronavirus 2: NEGATIVE

## 2020-05-25 ENCOUNTER — Other Ambulatory Visit (HOSPITAL_COMMUNITY)
Admission: RE | Admit: 2020-05-25 | Discharge: 2020-05-25 | Disposition: A | Discharge status: Home / Self Care | Payer: Medicaid Other | Source: Ambulatory Visit | Attending: Adult Health | Admitting: Adult Health

## 2020-05-25 ENCOUNTER — Encounter: Payer: Self-pay | Admitting: Adult Health

## 2020-05-25 ENCOUNTER — Ambulatory Visit (INDEPENDENT_AMBULATORY_CARE_PROVIDER_SITE_OTHER): Payer: Medicaid Other | Admitting: Adult Health

## 2020-05-25 VITALS — BP 131/88 | HR 89 | Ht 64.0 in | Wt 119.0 lb

## 2020-05-25 DIAGNOSIS — F329 Major depressive disorder, single episode, unspecified: Secondary | ICD-10-CM

## 2020-05-25 DIAGNOSIS — Z780 Asymptomatic menopausal state: Secondary | ICD-10-CM | POA: Diagnosis not present

## 2020-05-25 DIAGNOSIS — R232 Flushing: Secondary | ICD-10-CM | POA: Diagnosis not present

## 2020-05-25 DIAGNOSIS — N891 Moderate vaginal dysplasia: Secondary | ICD-10-CM

## 2020-05-25 DIAGNOSIS — F419 Anxiety disorder, unspecified: Secondary | ICD-10-CM | POA: Insufficient documentation

## 2020-05-25 DIAGNOSIS — F32A Depression, unspecified: Secondary | ICD-10-CM

## 2020-05-25 DIAGNOSIS — Z79899 Other long term (current) drug therapy: Secondary | ICD-10-CM

## 2020-05-25 MED ORDER — HYDROXYZINE HCL 10 MG PO TABS: 10.0000 mg | ORAL_TABLET | Freq: Three times a day (TID) | ORAL | 1 refills | Status: DC | PRN

## 2020-05-25 NOTE — Progress Notes (Signed)
  Subjective:     Patient ID: [name], female   DOB: October 08, 1965, 54 y.o.   MRN: [id number]  HPI [name] is a 54 year old white female,single sp hysterectomy,( with history of VAIN II sp laser in 2017.She said she had pap in 2018 with Dr Ulanda Edison, as Dr Deatra Ina retired and she said the pap was abnormal but had normal colpo and has not had exam or pap since, when I questioned her about this.) She is here today to talk about the estrogen patch, feels like she has gained weight and having leg cramps.   Review of Systems Weight gain Leg cramps at times  +stressed, family put her mom in nursing home an are testing her  +anxiety Decreased libido. Hot flashes better Reviewed past medical,surgical, social and family history. Reviewed medications and allergies.     Objective:   Physical Exam BP 131/88 (BP Location: Right Arm, Patient Position: Sitting, Cuff Size: Normal)   Pulse 89   Ht 5\' 4"  (1.626 m)   Wt 119 lb (54 kg)   BMI 20.43 kg/m   Skin warm and dry.Pelvic: external genitalia is normal in appearance no lesions, vagina is pink, no lesions seen pap with high risk HPV genotyping performed ,urethra has no lesions or masses noted, cervix and uterus are absent, adnexa: no masses or tenderness noted. Bladder is non tender and no masses felt. Only gained 1 pound since April.  PHQ 9 score is 15, no SI GAD 15  Fall risk is low Examination chaperoned by Levy Pupa LPN   [facility] - 08/65/78 1505      Pregnancy Intention Screening   Does the patient want to become pregnant in the next year? N/A    Does the patient's partner want to become pregnant in the next year? N/A    Would the patient like to discuss contraceptive options today? N/A      Contraception Wrap Up   Current Method No Method - Other Reason   hyst   End Method No Method - Other Reason   hyst   Contraception Counseling Provided No             Assessment:    1. VAIN II (vaginal intraepithelial neoplasia grade II) Pap  sent  2. Hot flashes,better with the patch Continue  Vivelle Dot   3. Depression, unspecified depression type She stopped Zoloft felt drugged  4. Menopause Continue patch  5. Anxiety Will try vistaril Meds ordered this encounter  Medications  . hydrOXYzine (ATARAX/VISTARIL) 10 MG tablet    Sig: Take 1 tablet (10 mg total) by mouth 3 (three) times daily as needed.    Dispense:  45 tablet    Refill:  1    Order Specific Question:   Supervising Provider    Answer:   Elonda Husky, LUTHER H [[id number]]    6. Current use of estrogen therapy Has refills on patch        Plan:     Follow up in 6 months of sooner if needed

## 2020-05-31 MED ORDER — LORAZEPAM 2 MG/ML IJ SOLN
INTRAMUSCULAR | Status: AC
  Filled 2020-05-31: qty 1

## 2020-06-02 LAB — CYTOLOGY - PAP
Comment: NEGATIVE
Diagnosis: NEGATIVE
High risk HPV: NEGATIVE

## 2020-06-07 ENCOUNTER — Telehealth: Payer: Self-pay | Admitting: Adult Health

## 2020-06-07 NOTE — Telephone Encounter (Signed)
Telephoned patient at home number. Patient states has yeast infection. States tried OTC Monistat and not helping. No itching but white/clumpy discharge. Advised patient to check with pharmacy tomorrow. Patient voiced understanding.

## 2020-06-07 NOTE — Telephone Encounter (Signed)
Patient would like f/u call regarding yeast infection symptoms

## 2020-06-08 MED ORDER — FLUCONAZOLE 150 MG PO TABS: ORAL_TABLET | ORAL | 1 refills | Status: DC

## 2020-06-08 NOTE — Addendum Note (Signed)
Addended by: Derrek Monaco A on: 06/08/2020 11:59 AM   Modules accepted: Orders

## 2020-06-08 NOTE — Telephone Encounter (Signed)
Rx sent for diflucan

## 2020-06-09 ENCOUNTER — Encounter (HOSPITAL_COMMUNITY)
Admission: RE | Admit: 2020-06-09 | Discharge: 2020-06-09 | Disposition: A | Discharge status: Home / Self Care | Payer: Medicaid Other | Source: Ambulatory Visit | Attending: Internal Medicine | Admitting: Internal Medicine

## 2020-06-09 ENCOUNTER — Other Ambulatory Visit: Payer: Self-pay

## 2020-06-09 ENCOUNTER — Encounter (HOSPITAL_COMMUNITY): Payer: Self-pay

## 2020-06-09 NOTE — Patient Instructions (Signed)
[name]  06/09/2020     @PREFPERIOPPHARMACY @   Your procedure is scheduled on 06/14/2020.  Report to [facility] at 9:45 A.M.  Call this number if you have problems the morning of surgery:  [phone]   Remember:  Do not eat or drink after midnight.    Please follow the prep instructions given to you by Dr. Olevia Perches office     Take these medicines the morning of surgery with A SIP OF WATER : Pantaprazole and Tramadol if needed    Do not wear jewelry, make-up or nail polish.  Do not wear lotions, powders, or perfumes, or deodorant.  Do not shave 48 hours prior to surgery.  Men may shave face and neck.  Do not bring valuables to the hospital.  [facility] is not responsible for any belongings or valuables.  Contacts, dentures or bridgework may not be worn into surgery.  Leave your suitcase in the car.  After surgery it may be brought to your room.  For patients admitted to the hospital, discharge time will be determined by your treatment team.  Patients discharged the day of surgery will not be allowed to drive home.   Name and phone number of your driver:   family Special instructions:  n/a  Please read over the following fact sheets that you were given. Care and Recovery After Surgery    Upper Endoscopy, Adult Upper endoscopy is a procedure to look inside the upper GI (gastrointestinal) tract. The upper GI tract is made up of:  The part of the body that moves food from your mouth to your stomach (esophagus).  The stomach.  The first part of your small intestine (duodenum). This procedure is also called esophagogastroduodenoscopy (EGD) or gastroscopy. In this procedure, your health care provider passes a thin, flexible tube (endoscope) through your mouth and down your esophagus into your stomach. A small camera is attached to the end of the tube. Images from the camera appear on a monitor in the exam room. During this procedure, your health care provider may also  remove a small piece of tissue to be sent to a lab and examined under a microscope (biopsy). Your health care provider may do an upper endoscopy to diagnose cancers of the upper GI tract. You may also have this procedure to find the cause of other conditions, such as:  Stomach pain.  Heartburn.  Pain or problems when swallowing.  Nausea and vomiting.  Stomach bleeding.  Stomach ulcers. Tell a health care provider about:  Any allergies you have.  All medicines you are taking, including vitamins, herbs, eye drops, creams, and over-the-counter medicines.  Any problems you or family members have had with anesthetic medicines.  Any blood disorders you have.  Any surgeries you have had.  Any medical conditions you have.  Whether you are pregnant or may be pregnant. What are the risks? Generally, this is a safe procedure. However, problems may occur, including:  Infection.  Bleeding.  Allergic reactions to medicines.  A tear or hole (perforation) in the esophagus, stomach, or duodenum. What happens before the procedure? Staying hydrated Follow instructions from your health care provider about hydration, which may include:  Up to 2 hours before the procedure - you may continue to drink clear liquids, such as water, clear fruit juice, black coffee, and plain tea.  Eating and drinking restrictions Follow instructions from your health care provider about eating and drinking, which may include:  8 hours before the procedure -  stop eating heavy meals or foods, such as meat, fried foods, or fatty foods.  6 hours before the procedure - stop eating light meals or foods, such as toast or cereal.  6 hours before the procedure - stop drinking milk or drinks that contain milk.  2 hours before the procedure - stop drinking clear liquids. Medicines Ask your health care provider about:  Changing or stopping your regular medicines. This is especially important if you are taking  diabetes medicines or blood thinners.  Taking medicines such as aspirin and ibuprofen. These medicines can thin your blood. Do not take these medicines unless your health care provider tells you to take them.  Taking over-the-counter medicines, vitamins, herbs, and supplements. General instructions  Plan to have someone take you home from the hospital or clinic.  If you will be going home right after the procedure, plan to have someone with you for 24 hours.  Ask your health care provider what steps will be taken to help prevent infection. What happens during the procedure?   An IV will be inserted into one of your veins.  You may be given one or more of the following: ? A medicine to help you relax (sedative). ? A medicine to numb the throat (local anesthetic).  You will lie on your left side on an exam table.  Your health care provider will pass the endoscope through your mouth and down your esophagus.  Your health care provider will use the scope to check the inside of your esophagus, stomach, and duodenum. Biopsies may be taken.  The endoscope will be removed. The procedure may vary among health care providers and hospitals. What happens after the procedure?  Your blood pressure, heart rate, breathing rate, and blood oxygen level will be monitored until you leave the hospital or clinic.  Do not drive for 24 hours if you were given a sedative during your procedure.  When your throat is no longer numb, you may be given some fluids to drink.  It is up to you to get the results of your procedure. Ask your health care provider, or the department that is doing the procedure, when your results will be ready. Summary  Upper endoscopy is a procedure to look inside the upper GI tract.  During the procedure, an IV will be inserted into one of your veins. You may be given a medicine to help you relax.  A medicine will be used to numb your throat.  The endoscope will be passed  through your mouth and down your esophagus. This information is not intended to replace advice given to you by your health care provider. Make sure you discuss any questions you have with your health care provider. Document Revised: 03/04/2018 Document Reviewed: 02/09/2018 Elsevier Patient Education  [place].  Colonoscopy, Adult A colonoscopy is a procedure to look at the entire large intestine. This procedure is done using a long, thin, flexible tube that has a camera on the end. You may have a colonoscopy:  As a part of normal colorectal screening.  If you have certain symptoms, such as: ? A low number of red blood cells in your blood (anemia). ? Diarrhea that does not go away. ? Pain in your abdomen. ? Blood in your stool. A colonoscopy can help screen for and diagnose medical problems, including:  Tumors.  Extra tissue that grows where mucus forms (polyps).  Inflammation.  Areas of bleeding. Tell your health care provider about:  Any allergies you  have.  All medicines you are taking, including vitamins, herbs, eye drops, creams, and over-the-counter medicines.  Any problems you or family members have had with anesthetic medicines.  Any blood disorders you have.  Any surgeries you have had.  Any medical conditions you have.  Any problems you have had with having bowel movements.  Whether you are pregnant or may be pregnant. What are the risks? Generally, this is a safe procedure. However, problems may occur, including:  Bleeding.  Damage to your intestine.  Allergic reactions to medicines given during the procedure.  Infection. This is rare. What happens before the procedure? Eating and drinking restrictions Follow instructions from your health care provider about eating or drinking restrictions, which may include:  A few days before the procedure: ? Follow a low-fiber diet. ? Avoid nuts, seeds, dried fruit, raw fruits, and vegetables.  1-3  days before the procedure: ? Eat only gelatin dessert or ice pops. ? Drink only clear liquids, such as water, clear juice, clear broth or bouillon, black coffee or tea, or clear soft drinks or sports drinks. ? Avoid liquids that contain red or purple dye.  The day of the procedure: ? Do not eat solid foods. You may continue to drink clear liquids until up to 2 hours before the procedure. ? Do not eat or drink anything starting 2 hours before the procedure, or within the time period that your health care provider recommends. Bowel prep If you were prescribed a bowel prep to take by mouth (orally) to clean out your colon:  Take it as told by your health care provider. Starting the day before your procedure, you will need to drink a large amount of liquid medicine. The liquid will cause you to have many bowel movements of loose stool until your stool becomes almost clear or light green.  If your skin or the opening between the buttocks (anus) gets irritated from diarrhea, you may relieve the irritation using: ? Wipes with medicine in them, such as adult wet wipes with aloe and vitamin E. ? A product to soothe skin, such as petroleum jelly.  If you vomit while drinking the bowel prep: ? Take a break for up to 60 minutes. ? Begin the bowel prep again. ? Call your health care provider if you keep vomiting or you cannot take the bowel prep without vomiting.  To clean out your colon, you may also be given: ? Laxative medicines. These help you have a bowel movement. ? Instructions for enema use. An enema is liquid medicine injected into your rectum. Medicines Ask your health care provider about:  Changing or stopping your regular medicines or supplements. This is especially important if you are taking iron supplements, diabetes medicines, or blood thinners.  Taking medicines such as aspirin and ibuprofen. These medicines can thin your blood. Do not take these medicines unless your health care  provider tells you to take them.  Taking over-the-counter medicines, vitamins, herbs, and supplements. General instructions  Ask your health care provider what steps will be taken to help prevent infection. These may include washing skin with a germ-killing soap.  Plan to have someone take you home from the hospital or clinic. What happens during the procedure?   An IV will be inserted into one of your veins.  You may be given one or more of the following: ? A medicine to help you relax (sedative). ? A medicine to numb the area (local anesthetic). ? A medicine to make you fall  asleep (general anesthetic). This is rarely needed.  You will lie on your side with your knees bent.  The tube will: ? Have oil or gel put on it (be lubricated). ? Be inserted into your anus. ? Be gently eased through all parts of your large intestine.  Air will be sent into your colon to keep it open. This may cause some pressure or cramping.  Images will be taken with the camera and will appear on a screen.  A small tissue sample may be removed to be looked at under a microscope (biopsy). The tissue may be sent to a lab for testing if any signs of problems are found.  If small polyps are found, they may be removed and checked for cancer cells.  When the procedure is finished, the tube will be removed. The procedure may vary among health care providers and hospitals. What happens after the procedure?  Your blood pressure, heart rate, breathing rate, and blood oxygen level will be monitored until you leave the hospital or clinic.  You may have a small amount of blood in your stool.  You may pass gas and have mild cramping or bloating in your abdomen. This is caused by the air that was used to open your colon during the exam.  Do not drive for 24 hours after the procedure.  It is up to you to get the results of your procedure. Ask your health care provider, or the department that is doing the  procedure, when your results will be ready. Summary  A colonoscopy is a procedure to look at the entire large intestine.  Follow instructions from your health care provider about eating and drinking before the procedure.  If you were prescribed an oral bowel prep to clean out your colon, take it as told by your health care provider.  During the colonoscopy, a flexible tube with a camera on its end is inserted into the anus and then passed into the other parts of the large intestine. This information is not intended to replace advice given to you by your health care provider. Make sure you discuss any questions you have with your health care provider. Document Revised: 04/02/2019 Document Reviewed: 04/02/2019 Elsevier Patient Education  [place].

## 2020-06-12 ENCOUNTER — Other Ambulatory Visit: Payer: Self-pay

## 2020-06-12 ENCOUNTER — Other Ambulatory Visit (HOSPITAL_COMMUNITY)
Admission: RE | Admit: 2020-06-12 | Discharge: 2020-06-12 | Disposition: A | Discharge status: Home / Self Care | Payer: Medicaid Other | Source: Ambulatory Visit | Attending: Internal Medicine | Admitting: Internal Medicine

## 2020-06-12 DIAGNOSIS — Z01812 Encounter for preprocedural laboratory examination: Secondary | ICD-10-CM | POA: Insufficient documentation

## 2020-06-12 DIAGNOSIS — Z20822 Contact with and (suspected) exposure to covid-19: Secondary | ICD-10-CM | POA: Insufficient documentation

## 2020-06-12 LAB — SARS CORONAVIRUS 2 (TAT 6-24 HRS): SARS Coronavirus 2: NEGATIVE

## 2020-06-13 ENCOUNTER — Other Ambulatory Visit (HOSPITAL_COMMUNITY): Admission: RE | Admit: 2020-06-13 | Payer: Medicaid Other | Source: Ambulatory Visit

## 2020-06-14 ENCOUNTER — Other Ambulatory Visit: Payer: Self-pay

## 2020-06-14 ENCOUNTER — Ambulatory Visit (HOSPITAL_COMMUNITY)
Admission: RE | Admit: 2020-06-14 | Discharge: 2020-06-14 | Disposition: A | Discharge status: Home / Self Care | Payer: Medicaid Other | Source: Ambulatory Visit | Attending: Internal Medicine | Admitting: Internal Medicine

## 2020-06-14 ENCOUNTER — Encounter (HOSPITAL_COMMUNITY): Payer: Self-pay | Admitting: Internal Medicine

## 2020-06-14 ENCOUNTER — Encounter (HOSPITAL_COMMUNITY)
Admission: RE | Disposition: A | Discharge status: Home / Self Care | Payer: Self-pay | Source: Ambulatory Visit | Attending: Internal Medicine

## 2020-06-14 ENCOUNTER — Ambulatory Visit (HOSPITAL_COMMUNITY): Payer: Medicaid Other | Admitting: Anesthesiology

## 2020-06-14 DIAGNOSIS — K259 Gastric ulcer, unspecified as acute or chronic, without hemorrhage or perforation: Secondary | ICD-10-CM

## 2020-06-14 DIAGNOSIS — K580 Irritable bowel syndrome with diarrhea: Secondary | ICD-10-CM | POA: Insufficient documentation

## 2020-06-14 DIAGNOSIS — K449 Diaphragmatic hernia without obstruction or gangrene: Secondary | ICD-10-CM | POA: Insufficient documentation

## 2020-06-14 DIAGNOSIS — Z882 Allergy status to sulfonamides status: Secondary | ICD-10-CM | POA: Diagnosis not present

## 2020-06-14 DIAGNOSIS — Z885 Allergy status to narcotic agent status: Secondary | ICD-10-CM | POA: Diagnosis not present

## 2020-06-14 DIAGNOSIS — Z832 Family history of diseases of the blood and blood-forming organs and certain disorders involving the immune mechanism: Secondary | ICD-10-CM | POA: Diagnosis not present

## 2020-06-14 DIAGNOSIS — K269 Duodenal ulcer, unspecified as acute or chronic, without hemorrhage or perforation: Secondary | ICD-10-CM | POA: Insufficient documentation

## 2020-06-14 DIAGNOSIS — Z86018 Personal history of other benign neoplasm: Secondary | ICD-10-CM | POA: Diagnosis not present

## 2020-06-14 DIAGNOSIS — Z833 Family history of diabetes mellitus: Secondary | ICD-10-CM | POA: Diagnosis not present

## 2020-06-14 DIAGNOSIS — Z90710 Acquired absence of both cervix and uterus: Secondary | ICD-10-CM | POA: Diagnosis not present

## 2020-06-14 DIAGNOSIS — M797 Fibromyalgia: Secondary | ICD-10-CM | POA: Diagnosis not present

## 2020-06-14 DIAGNOSIS — Z8249 Family history of ischemic heart disease and other diseases of the circulatory system: Secondary | ICD-10-CM | POA: Insufficient documentation

## 2020-06-14 DIAGNOSIS — F419 Anxiety disorder, unspecified: Secondary | ICD-10-CM | POA: Insufficient documentation

## 2020-06-14 DIAGNOSIS — K311 Adult hypertrophic pyloric stenosis: Secondary | ICD-10-CM | POA: Diagnosis not present

## 2020-06-14 DIAGNOSIS — K219 Gastro-esophageal reflux disease without esophagitis: Secondary | ICD-10-CM | POA: Diagnosis not present

## 2020-06-14 DIAGNOSIS — I10 Essential (primary) hypertension: Secondary | ICD-10-CM | POA: Insufficient documentation

## 2020-06-14 DIAGNOSIS — K229 Disease of esophagus, unspecified: Secondary | ICD-10-CM | POA: Insufficient documentation

## 2020-06-14 DIAGNOSIS — Z79899 Other long term (current) drug therapy: Secondary | ICD-10-CM | POA: Diagnosis not present

## 2020-06-14 DIAGNOSIS — R1314 Dysphagia, pharyngoesophageal phase: Secondary | ICD-10-CM | POA: Insufficient documentation

## 2020-06-14 DIAGNOSIS — Z888 Allergy status to other drugs, medicaments and biological substances status: Secondary | ICD-10-CM | POA: Insufficient documentation

## 2020-06-14 DIAGNOSIS — F17210 Nicotine dependence, cigarettes, uncomplicated: Secondary | ICD-10-CM | POA: Diagnosis not present

## 2020-06-14 DIAGNOSIS — Z8711 Personal history of peptic ulcer disease: Secondary | ICD-10-CM | POA: Diagnosis not present

## 2020-06-14 DIAGNOSIS — R1013 Epigastric pain: Secondary | ICD-10-CM | POA: Insufficient documentation

## 2020-06-14 DIAGNOSIS — F329 Major depressive disorder, single episode, unspecified: Secondary | ICD-10-CM | POA: Insufficient documentation

## 2020-06-14 DIAGNOSIS — Z880 Allergy status to penicillin: Secondary | ICD-10-CM | POA: Diagnosis not present

## 2020-06-14 HISTORY — PX: ESOPHAGOGASTRODUODENOSCOPY (EGD) WITH PROPOFOL: SHX5813

## 2020-06-14 HISTORY — PX: ESOPHAGEAL DILATION: SHX303

## 2020-06-14 LAB — KOH PREP: KOH Prep: NONE SEEN

## 2020-06-14 SURGERY — ESOPHAGOGASTRODUODENOSCOPY (EGD) WITH PROPOFOL
Anesthesia: General

## 2020-06-14 MED ORDER — FENTANYL CITRATE (PF) 100 MCG/2ML IJ SOLN
INTRAMUSCULAR | Status: AC
  Filled 2020-06-14: qty 2

## 2020-06-14 MED ORDER — GLYCOPYRROLATE 0.2 MG/ML IJ SOLN
0.2000 mg | Freq: Once | INTRAMUSCULAR | Status: AC
  Administered 2020-06-14: 0.2 mg via INTRAVENOUS

## 2020-06-14 MED ORDER — SODIUM CHLORIDE FLUSH 0.9 % IV SOLN
INTRAVENOUS | Status: AC
  Filled 2020-06-14: qty 20

## 2020-06-14 MED ORDER — FENTANYL CITRATE (PF) 100 MCG/2ML IJ SOLN
INTRAMUSCULAR | Status: DC | PRN
Start: 2020-06-14 — End: 2020-06-14
  Administered 2020-06-14 (×2): 50 ug via INTRAVENOUS

## 2020-06-14 MED ORDER — PROPOFOL 10 MG/ML IV BOLUS
INTRAVENOUS | Status: DC | PRN
  Administered 2020-06-14: 100 mg via INTRAVENOUS
  Administered 2020-06-14: 50 mg via INTRAVENOUS

## 2020-06-14 MED ORDER — LACTATED RINGERS IV SOLN: Freq: Once | INTRAVENOUS | Status: AC

## 2020-06-14 MED ORDER — PANTOPRAZOLE SODIUM 40 MG PO TBEC: 40.0000 mg | DELAYED_RELEASE_TABLET | Freq: Two times a day (BID) | ORAL | 5 refills | Status: DC

## 2020-06-14 MED ORDER — PROPOFOL 500 MG/50ML IV EMUL
INTRAVENOUS | Status: DC | PRN
  Administered 2020-06-14: 150 ug/kg/min via INTRAVENOUS

## 2020-06-14 MED ORDER — LACTATED RINGERS IV SOLN: INTRAVENOUS | Status: DC | PRN

## 2020-06-14 MED ORDER — GLYCOPYRROLATE 0.2 MG/ML IJ SOLN
INTRAMUSCULAR | Status: AC
  Filled 2020-06-14: qty 1

## 2020-06-14 MED ORDER — LIDOCAINE VISCOUS HCL 2 % MT SOLN
15.0000 mL | Freq: Once | OROMUCOSAL | Status: AC
  Administered 2020-06-14: 15 mL via OROMUCOSAL

## 2020-06-14 MED ORDER — LIDOCAINE VISCOUS HCL 2 % MT SOLN
OROMUCOSAL | Status: AC
  Filled 2020-06-14: qty 15

## 2020-06-14 NOTE — Discharge Instructions (Signed)
No aspirin or NSAIDs in any form or shape. Take pantoprazole 40 mg by mouth 30 minutes before breakfast and evening meal daily. Resume other medications as before. Low residue diet and remember to chew food thoroughly. No driving for 24 hours. Physician will call with results of brushing and biopsy.   Upper Endoscopy, Adult, Care After This sheet gives you information about how to care for yourself after your procedure. Your health care provider may also give you more specific instructions. If you have problems or questions, contact your health care provider. What can I expect after the procedure? After the procedure, it is common to have:  A sore throat.  Mild stomach pain or discomfort.  Bloating.  Nausea. Follow these instructions at home:   Follow instructions from your health care provider about what to eat or drink after your procedure.  Return to your normal activities as told by your health care provider. Ask your health care provider what activities are safe for you.  Take over-the-counter and prescription medicines only as told by your health care provider.  Do not drive for 24 hours if you were given a sedative during your procedure.  Keep all follow-up visits as told by your health care provider. This is important. Contact a health care provider if you have:  A sore throat that lasts longer than one day.  Trouble swallowing. Get help right away if:  You vomit blood or your vomit looks like coffee grounds.  You have: ? A fever. ? Bloody, black, or tarry stools. ? A severe sore throat or you cannot swallow. ? Difficulty breathing. ? Severe pain in your chest or abdomen. Summary  After the procedure, it is common to have a sore throat, mild stomach discomfort, bloating, and nausea.  Do not drive for 24 hours if you were given a sedative during the procedure.  Follow instructions from your health care provider about what to eat or drink after your  procedure.  Return to your normal activities as told by your health care provider. This information is not intended to replace advice given to you by your health care provider. Make sure you discuss any questions you have with your health care provider. Document Revised: 03/03/2018 Document Reviewed: 02/09/2018 Elsevier Patient Education  2020 [place] After These instructions provide you with information about caring for yourself after your procedure. Your health care provider may also give you more specific instructions. Your treatment has been planned according to current medical practices, but problems sometimes occur. Call your health care provider if you have any problems or questions after your procedure. What can I expect after the procedure? After your procedure, you may:  Feel sleepy for several hours.  Feel clumsy and have poor balance for several hours.  Feel forgetful about what happened after the procedure.  Have poor judgment for several hours.  Feel nauseous or vomit.  Have a sore throat if you had a breathing tube during the procedure. Follow these instructions at home: For at least 24 hours after the procedure:      Have a responsible adult stay with you. It is important to have someone help care for you until you are awake and alert.  Rest as needed.  Do not: ? Participate in activities in which you could fall or become injured. ? Drive. ? Use heavy machinery. ? Drink alcohol. ? Take sleeping pills or medicines that cause drowsiness. ? Make important decisions or sign legal  documents. ? Take care of children on your own. Eating and drinking  Follow the diet that is recommended by your health care provider.  If you vomit, drink water, juice, or soup when you can drink without vomiting.  Make sure you have little or no nausea before eating solid foods. General instructions  Take over-the-counter and  prescription medicines only as told by your health care provider.  If you have sleep apnea, surgery and certain medicines can increase your risk for breathing problems. Follow instructions from your health care provider about wearing your sleep device: ? Anytime you are sleeping, including during daytime naps. ? While taking prescription pain medicines, sleeping medicines, or medicines that make you drowsy.  If you smoke, do not smoke without supervision.  Keep all follow-up visits as told by your health care provider. This is important. Contact a health care provider if:  You keep feeling nauseous or you keep vomiting.  You feel light-headed.  You develop a rash.  You have a fever. Get help right away if:  You have trouble breathing. Summary  For several hours after your procedure, you may feel sleepy and have poor judgment.  Have a responsible adult stay with you for at least 24 hours or until you are awake and alert. This information is not intended to replace advice given to you by your health care provider. Make sure you discuss any questions you have with your health care provider. Document Revised: 12/08/2017 Document Reviewed: 12/31/2015 Elsevier Patient Education  [place].

## 2020-06-14 NOTE — Anesthesia Postprocedure Evaluation (Signed)
Anesthesia Post Note  Patient: [name]  Procedure(s) Performed: ESOPHAGOGASTRODUODENOSCOPY (EGD) WITH PROPOFOL (N/A ) ESOPHAGEAL DILATION (N/A )  Patient location during evaluation: PACU Anesthesia Type: General Level of consciousness: awake, oriented, awake and alert and patient cooperative Pain management: satisfactory to patient Vital Signs Assessment: post-procedure vital signs reviewed and stable Respiratory status: spontaneous breathing, respiratory function stable and nonlabored ventilation Cardiovascular status: stable Postop Assessment: no apparent nausea or vomiting Anesthetic complications: no   No complications documented.   Last Vitals:  Vitals:   06/14/20 1001 06/14/20 1102  BP: 135/73 (!) 144/89  Pulse:    Resp:    Temp:    SpO2:      Last Pain:  Vitals:   06/14/20 0957  TempSrc: Oral  PainSc: 0-No pain                 Tali Coster

## 2020-06-14 NOTE — H&P (Signed)
[name] is an 54 y.o. female.   Chief Complaint: Patient is here for esophagogastroduodenoscopy and possible esophageal dilation. HPI: Patient is a 54 year old Caucasian female with complicated history of peptic ulcer disease with 2 prior surgeries and recurrent disease who now presents with 1 month history of midepigastric pain at times made worse with meals she also complains of dysphagia.  She has had 2 episodes of food impaction one with rice another one with meat.  She finally able to get relief spontaneously.  She denies nausea vomiting hematemesis melena or rectal bleeding.  She reassures me that she is not taking any NSAIDs.  She is still smoking cigarettes.  She smokes about a pack a day.  She says she has been under a lot of stress.  She has been taking her PPI as recommended.  Past Medical History:  Diagnosis Date  . Anxiety   . Depression   . Essential hypertension, benign 12/26/2016  . Fibroadenoma of right breast    S/P LUMPECTOMY W/ RADIOACTIVE SEED IMPLANT 05-19-2015  . Fibromyalgia   . GERD (gastroesophageal reflux disease)   . History of cervical dysplasia    s/p conzation and recurrent s/p  vaginal hysterectomy 2008  . History of duodenal ulcer    1992--  S/P  REPAIR PERFORATED ULCER  . History of gastric ulcer    age 24  . Hypertension   . IBS (irritable bowel syndrome)   . Vaginal Pap smear, abnormal   . VIN II (vulvar intraepithelial neoplasia II)   . Wears glasses   . Wears partial dentures    lower    Past Surgical History:  Procedure Laterality Date  . BLADDER SLING PROCEDURE  2005  approx.  . CERVICAL CONIZATION W/BX  2006  approx  . CO2 LASER APPLICATION N/A 8/88/9169   Procedure: CO2 LASER OF THE VAGINA;  Surgeon: Everitt Amber, MD;  Location[facility];  Service: Gynecology;  Laterality: N/A;  . ESOPHAGOGASTRODUODENOSCOPY N/A 01/21/2017   Procedure: ESOPHAGOGASTRODUODENOSCOPY (EGD);  Surgeon: Rogene Houston, MD;  [facility];  Service: Endoscopy;  Laterality: N/A;  . ESOPHAGOGASTRODUODENOSCOPY N/A 07/17/2017   Procedure: ESOPHAGOGASTRODUODENOSCOPY (EGD);  Surgeon: Rogene Houston, MD;  Location: [facility];  Service: Endoscopy;  Laterality: N/A;  1:00  . ESOPHAGOGASTRODUODENOSCOPY N/A 10/16/2017   Procedure: ESOPHAGOGASTRODUODENOSCOPY (EGD)WITH DILATION;  Surgeon: Rogene Houston, MD;  Location: [facility];  Service: Endoscopy;  Laterality: N/A;  1030  . ESOPHAGOGASTRODUODENOSCOPY (EGD) WITH PROPOFOL N/A 01/23/2017   Procedure: ESOPHAGOGASTRODUODENOSCOPY (EGD) WITH PROPOFOL;  Surgeon: Rogene Houston, MD;  Location: [facility];  Service: Endoscopy;  Laterality: N/A;  duodenal striture dilation  . LAPAROSCOPY N/A 12/19/2017   Procedure: LAPAROSCOPY, PYLOROPLASTY WITH Silvestre Gunner;  Surgeon: Johnathan Hausen, MD;  Location: [facility];  Service: General;  Laterality: N/A;  . NASAL SEPTOPLASTY W/ TURBINOPLASTY Bilateral 04/30/2019   Procedure: BILATERAL NASAL SEPTOPLASTY WITH TURBINATE REDUCTION;  Surgeon: Leta Baptist, MD;  Location: [facility];  Service: ENT;  Laterality: Bilateral;  . RADIOACTIVE SEED GUIDED EXCISIONAL BREAST BIOPSY Right 05/19/2015   Procedure: EXCISIONAL RIGHT BREAST MASS WITH RADIOACTIVE SEED LOCALIZATION;  Surgeon: Fanny Skates, MD;  Location: [place];  Service: General;  Laterality: Right;  . REPAIR OF PERFORATED ULCER  1992   duodenal  . VAGINAL HYSTERECTOMY  11/13/2006    Family History  Problem Relation Age of Onset  . Hypertension Mother   . Clotting disorder Father   .  Diabetes Father   . Hypertension Father   . Hypertension Sister   . Hypertension Brother   . Hypertension Maternal Aunt   . Hypertension Maternal Uncle   . Hypertension Paternal Aunt   . Hypertension Paternal Uncle    Social History:  reports that she has been smoking cigarettes. She has a 18.00 pack-year smoking history. She has never used smokeless tobacco. She reports that  she does not drink alcohol and does not use drugs.  Allergies:  Allergies  Allergen Reactions  . Codeine Nausea Only  . Hydrocodone Nausea Only    feels hot and insomia  . Sulfa Antibiotics Hives  . Penicillin V Nausea Only    Medications Prior to Admission  Medication Sig Dispense Refill  . estradiol (VIVELLE-DOT) 0.1 MG/24HR patch Place 1 patch (0.1 mg total) onto the skin 2 (two) times a week. 8 patch 12  . fluconazole (DIFLUCAN) 150 MG tablet Take 1 now and 1 in 3 days 2 tablet 1  . hydrOXYzine (ATARAX/VISTARIL) 10 MG tablet Take 1 tablet (10 mg total) by mouth 3 (three) times daily as needed. 45 tablet 1  . Multiple Vitamins-Minerals (CENTRUM ADULTS) TABS Take 1 tablet by mouth daily.     . pantoprazole (PROTONIX) 40 MG tablet Take 1 tablet (40 mg total) by mouth 2 (two) times daily before a meal. (Patient taking differently: Take 40 mg by mouth daily. ) 60 tablet 5  . traMADol (ULTRAM) 50 MG tablet Take 50 mg by mouth every 6 (six) hours as needed for moderate pain.     . valACYclovir (VALTREX) 1000 MG tablet Take 1,000 mg by mouth daily.  5  . dicyclomine (BENTYL) 10 MG capsule Take 1 capsule (10 mg total) by mouth 3 (three) times daily before meals. 90 capsule 1  . ketoconazole (NIZORAL) 2 % cream Apply 1 application topically daily. 30 g 1  . potassium chloride (K-DUR,KLOR-CON) 10 MEQ tablet Take 10 mEq by mouth every morning.       Results for orders placed or performed during the hospital encounter of 06/12/20 (from the past 48 hour(s))  SARS CORONAVIRUS 2 (TAT 6-24 HRS)     Status: None   Collection Time: 06/12/20  1:00 PM  Result Value Ref Range   SARS Coronavirus 2 NEGATIVE NEGATIVE    Comment: (NOTE) SARS-CoV-2 target nucleic acids are NOT DETECTED.  The SARS-CoV-2 RNA is generally detectable in upper and lower respiratory specimens during the acute phase of infection. Negative results do not preclude SARS-CoV-2 infection, do not rule out co-infections with other  pathogens, and should not be used as the sole basis for treatment or other patient management decisions. Negative results must be combined with clinical observations, patient history, and epidemiological information. The expected result is Negative.  Fact Sheet for Patients: SugarRoll.be  Fact Sheet for Healthcare Providers: https://www.woods-mathews.com/  This test is not yet approved or cleared by the [place] FDA and  has been authorized for detection and/or diagnosis of SARS-CoV-2 by FDA under an Emergency Use Authorization (EUA). This EUA will remain  in effect (meaning this test can be used) for the duration of the COVID-19 declaration under Se ction 564(b)(1) of the Act, 21 U.S.C. section 360bbb-3(b)(1), unless the authorization is terminated or revoked sooner.  Performed at [facility], [place] [address], [place], [place] [address]    No results found.  Review of Systems  Blood pressure (!) 144/89, pulse 88, temperature 98.2 F (36.8 C), temperature source Oral, resp.  rate 12, height 5\' 4"  (1.626 m), weight 53.1 kg, SpO2 95 %. Physical Exam HENT:     Mouth/Throat:     Mouth: Mucous membranes are moist.     Pharynx: Oropharynx is clear.  Eyes:     General: No scleral icterus.    Conjunctiva/sclera: Conjunctivae normal.  Cardiovascular:     Rate and Rhythm: Normal rate and regular rhythm.     Heart sounds: Normal heart sounds. No murmur heard.   Pulmonary:     Effort: Pulmonary effort is normal.     Breath sounds: Normal breath sounds.  Abdominal:     Comments: Abdomen is symmetrical.  She has midline and right subcostal scars.  Abdomen is soft.  No organomegaly or masses but she has mild midepigastric tenderness.  Musculoskeletal:        General: No swelling.     Cervical back: Neck supple.  Lymphadenopathy:     Cervical: No cervical adenopathy.  Skin:    General: Skin is warm and dry.  Neurological:      Mental Status: She is alert.      Assessment/Plan Esophageal dysphagia. Epigastric pain in a patient with  history of complicated peptic ulcer disease. Esophagogastroduodenoscopy with esophageal dilation. Please note patient was also scheduled for colonoscopy with she was not able to tolerate prep.    Hildred Laser, MD 06/14/2020, 11:17 AM

## 2020-06-14 NOTE — Anesthesia Preprocedure Evaluation (Signed)
Anesthesia Evaluation  Patient identified by MRN, date of birth, ID band Patient awake    Reviewed: Allergy & Precautions, NPO status , Patient's Chart, lab work & pertinent test results  History of Anesthesia Complications Negative for: history of anesthetic complications  Airway Mallampati: II  TM Distance: >3 FB Neck ROM: Full    Dental  (+) Dental Advisory Given, Partial Lower, Missing, Loose,    Pulmonary Current SmokerPatient did not abstain from smoking.,    Pulmonary exam normal breath sounds clear to auscultation       Cardiovascular Exercise Tolerance: Good hypertension, Pt. on medications Normal cardiovascular exam Rhythm:Regular Rate:Normal     Neuro/Psych PSYCHIATRIC DISORDERS Anxiety Depression  Neuromuscular disease    GI/Hepatic PUD, GERD  Medicated,  Endo/Other    Renal/GU      Musculoskeletal  (+) Fibromyalgia -, narcotic dependent  Abdominal   Peds  Hematology   Anesthesia Other Findings   Reproductive/Obstetrics                             Anesthesia Physical Anesthesia Plan  ASA: II  Anesthesia Plan: General   Post-op Pain Management:    Induction: Intravenous  PONV Risk Score and Plan: TIVA  Airway Management Planned: Nasal Cannula and Natural Airway  Additional Equipment:   Intra-op Plan:   Post-operative Plan:   Informed Consent: I have reviewed the patients History and Physical, chart, labs and discussed the procedure including the risks, benefits and alternatives for the proposed anesthesia with the patient or authorized representative who has indicated his/her understanding and acceptance.     Dental advisory given  Plan Discussed with: CRNA and Surgeon  Anesthesia Plan Comments:         Anesthesia Quick Evaluation

## 2020-06-14 NOTE — Addendum Note (Signed)
Addendum  created 06/14/20 1203 by Jonna Munro, CRNA   Intraprocedure Event edited

## 2020-06-14 NOTE — Transfer of Care (Signed)
Immediate Anesthesia Transfer of Care Note  Patient: [name]  Procedure(s) Performed: ESOPHAGOGASTRODUODENOSCOPY (EGD) WITH PROPOFOL (N/A ) ESOPHAGEAL DILATION (N/A )  Patient Location: PACU  Anesthesia Type:General  Level of Consciousness: awake, alert , oriented and patient cooperative  Airway & Oxygen Therapy: Patient Spontanous Breathing  Post-op Assessment: Report given to RN, Post -op Vital signs reviewed and stable and Patient moving all extremities X 4  Post vital signs: Reviewed and stable  Last Vitals:  Vitals Value Taken Time  BP    Temp    Pulse    Resp    SpO2      Last Pain:  Vitals:   06/14/20 0957  TempSrc: Oral  PainSc: 0-No pain         Complications: No complications documented.

## 2020-06-14 NOTE — Op Note (Signed)
[facility] Patient Name: [name] Procedure Date: 06/14/2020 11:02 AM MRN: [id number] Date of Birth: [birth year] Attending MD: Hildred Laser , MD CSN: [id number] Age: 54 Admit Type: Outpatient Procedure:                Upper GI endoscopy Indications:              Epigastric abdominal pain, Esophageal dysphagia Providers:                Hildred Laser, MD, Lurline Del, RN, Kristine L.                            Risa Grill, Technician, Bonnetta Barry, Technician Referring MD:             Glenda Chroman Medicines:                Propofol per Anesthesia Complications:            No immediate complications. Estimated Blood Loss:     Estimated blood loss: none. Estimated blood loss                            was minimal. Procedure:                Pre-Anesthesia Assessment:                           - Prior to the procedure, a History and Physical                            was performed, and patient medications and                            allergies were reviewed. The patient's tolerance of                            previous anesthesia was also reviewed. The risks                            and benefits of the procedure and the sedation                            options and risks were discussed with the patient.                            All questions were answered, and informed consent                            was obtained. Prior Anticoagulants: The patient has                            taken no previous anticoagulant or antiplatelet                            agents. ASA Grade Assessment: I - A normal, healthy  patient. After reviewing the risks and benefits,                            the patient was deemed in satisfactory condition to                            undergo the procedure.                           - Prior to the procedure, a History and Physical                            was performed, and patient medications and                             allergies were reviewed. The patient's tolerance of                            previous anesthesia was also reviewed. The risks                            and benefits of the procedure and the sedation                            options and risks were discussed with the patient.                            All questions were answered, and informed consent                            was obtained. Prior Anticoagulants: The patient has                            taken no previous anticoagulant or antiplatelet                            agents. ASA Grade Assessment: II - A patient with                            mild systemic disease. After reviewing the risks                            and benefits, the patient was deemed in                            satisfactory condition to undergo the procedure.                           After obtaining informed consent, the endoscope was                            passed under direct vision. Throughout the  procedure, the patient's blood pressure, pulse, and                            oxygen saturations were monitored continuously. The                            GIF-H190 ([id number]) scope was introduced through the                            mouth, and advanced to the second part of duodenum.                            The upper GI endoscopy was accomplished without                            difficulty. The patient tolerated the procedure                            well. Scope In: 11:26:15 AM Scope Out: 11:51:56 AM Total Procedure Duration: 0 hours 25 minutes 41 seconds  Findings:      The hypopharynx was normal.      Patchy, yellow plaques were found in the mid esophagus and in the distal       esophagus. Cells for cytology were obtained by brushing.      The exam of the esophagus was otherwise normal.      The Z-line was regular and was found 36 cm from the incisors.      A 2 cm hiatal hernia was present.      No  endoscopic abnormality was evident in the esophagus to explain the       patient's complaint of dysphagia. It was decided, however, to proceed       with dilation of the entire esophagus.      The scope was withdrawn. Dilation was performed with a Maloney dilator       with mild resistance at 77 Fr. The dilation site was examined following       endoscope reinsertion and showed mild mucosal disruption and no       perforation.      Two non-bleeding cratered gastric ulcers with no stigmata of bleeding       were found in the prepyloric region of the stomach. Biopsies were taken       with a cold forceps for histology. The pathology specimen was placed       into Bottle Number 1.      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       This was traversed by downsizing the scope. It was dilated with balloon       dilator from 12 mm. The dilation site was examined and showed mild       mucosal disruption, mild improvement in luminal narrowing and no       perforation.      One non-bleeding cratered duodenal ulcer with no stigmata of bleeding       was found in the duodenal bulb.      The second portion of the duodenum was normal. Estimated blood loss:       none. Impression:               -  Normal hypopharynx.                           - Esophageal plaques were found, doubt candidiasis.                            Cells for cytology/KOH obtained.                           - Z-line regular, 36 cm from the incisors.                           - 2 cm hiatal hernia.                           - No endoscopic esophageal abnormality to explain                            patient's dysphagia. Esophagus dilated. Dilated.                           - Non-bleeding gastric ulcers with no stigmata of                            bleeding. Biopsied.                           - Gastric stenosis was found at the pylorus.                            dilated with balloon to 12 mm.                            - Non-bleeding duodenal ulcer with no stigmata of                            bleeding.                           - Normal second portion of the duodenum. Moderate Sedation:      Per Anesthesia Care Recommendation:           - Patient has a contact number available for                            emergencies. The signs and symptoms of potential                            delayed complications were discussed with the                            patient. Return to normal activities tomorrow.                            Written discharge instructions were provided to the  patient.                           - Low fiber diet today.                           - Continue present medications.                           - No aspirin, ibuprofen, naproxen, or other                            non-steroidal anti-inflammatory drugs.                           - Await pathology results.                           - Return to GI clinic in 4 weeks. Procedure Code(s):        --- Professional ---                           [phone], Esophagogastroduodenoscopy, flexible,                            transoral; with biopsy, single or multiple                           43450, Dilation of esophagus, by unguided sound or                            bougie, single or multiple passes Diagnosis Code(s):        --- Professional ---                           K22.9, Disease of esophagus, unspecified                           K44.9, Diaphragmatic hernia without obstruction or                            gangrene                           K25.9, Gastric ulcer, unspecified as acute or                            chronic, without hemorrhage or perforation                           K31.1, Adult hypertrophic pyloric stenosis                           K26.9, Duodenal ulcer, unspecified as acute or                            chronic, without hemorrhage or perforation  R10.13, Epigastric pain                           R13.14, Dysphagia, pharyngoesophageal phase CPT copyright 2019 American Medical Association. All rights reserved. The codes documented in this report are preliminary and upon coder review may  be revised to meet current compliance requirements. Hildred Laser, MD Hildred Laser, MD 06/14/2020 12:13:51 PM This report has been signed electronically. Number of Addenda: 0

## 2020-06-15 ENCOUNTER — Other Ambulatory Visit: Payer: Self-pay

## 2020-06-15 LAB — SURGICAL PATHOLOGY

## 2020-06-20 ENCOUNTER — Encounter (HOSPITAL_COMMUNITY): Payer: Self-pay | Admitting: Internal Medicine

## 2020-07-12 ENCOUNTER — Encounter (INDEPENDENT_AMBULATORY_CARE_PROVIDER_SITE_OTHER): Payer: Self-pay | Admitting: *Deleted

## 2020-07-21 ENCOUNTER — Other Ambulatory Visit (INDEPENDENT_AMBULATORY_CARE_PROVIDER_SITE_OTHER): Payer: Self-pay | Admitting: *Deleted

## 2020-07-21 DIAGNOSIS — R1013 Epigastric pain: Secondary | ICD-10-CM

## 2020-07-21 DIAGNOSIS — R110 Nausea: Secondary | ICD-10-CM

## 2020-08-03 ENCOUNTER — Other Ambulatory Visit (INDEPENDENT_AMBULATORY_CARE_PROVIDER_SITE_OTHER): Payer: Self-pay | Admitting: *Deleted

## 2020-08-21 ENCOUNTER — Other Ambulatory Visit (HOSPITAL_COMMUNITY): Admission: RE | Admit: 2020-08-21 | Payer: Medicaid Other | Source: Ambulatory Visit

## 2020-08-21 ENCOUNTER — Encounter (HOSPITAL_COMMUNITY)
Admission: RE | Admit: 2020-08-21 | Discharge: 2020-08-21 | Disposition: A | Discharge status: Home / Self Care | Payer: Medicaid Other | Source: Ambulatory Visit | Attending: Internal Medicine | Admitting: Internal Medicine

## 2020-08-23 ENCOUNTER — Ambulatory Visit (HOSPITAL_COMMUNITY): Admission: RE | Admit: 2020-08-23 | Payer: Medicaid Other | Source: Ambulatory Visit | Admitting: Internal Medicine

## 2020-08-23 ENCOUNTER — Encounter (HOSPITAL_COMMUNITY): Admission: RE | Payer: Self-pay | Source: Ambulatory Visit

## 2020-08-23 SURGERY — ESOPHAGOGASTRODUODENOSCOPY (EGD) WITH PROPOFOL
Anesthesia: Monitor Anesthesia Care

## 2020-09-20 ENCOUNTER — Other Ambulatory Visit (INDEPENDENT_AMBULATORY_CARE_PROVIDER_SITE_OTHER): Payer: Self-pay

## 2020-09-20 ENCOUNTER — Encounter (INDEPENDENT_AMBULATORY_CARE_PROVIDER_SITE_OTHER): Payer: Self-pay

## 2020-09-20 ENCOUNTER — Telehealth (INDEPENDENT_AMBULATORY_CARE_PROVIDER_SITE_OTHER): Payer: Self-pay | Admitting: Internal Medicine

## 2020-09-20 DIAGNOSIS — K269 Duodenal ulcer, unspecified as acute or chronic, without hemorrhage or perforation: Secondary | ICD-10-CM

## 2020-09-20 DIAGNOSIS — R110 Nausea: Secondary | ICD-10-CM

## 2020-09-20 DIAGNOSIS — K589 Irritable bowel syndrome without diarrhea: Secondary | ICD-10-CM

## 2020-09-20 DIAGNOSIS — R1013 Epigastric pain: Secondary | ICD-10-CM

## 2020-09-20 DIAGNOSIS — K259 Gastric ulcer, unspecified as acute or chronic, without hemorrhage or perforation: Secondary | ICD-10-CM

## 2020-09-20 MED ORDER — DICYCLOMINE HCL 10 MG PO CAPS: 10.0000 mg | ORAL_CAPSULE | Freq: Three times a day (TID) | ORAL | 3 refills | Status: DC

## 2020-09-20 MED ORDER — SUCRALFATE 1 G PO TABS: ORAL_TABLET | ORAL | 3 refills | Status: DC

## 2020-09-20 NOTE — Telephone Encounter (Signed)
Patient aware of all and the medication has been sent to [facility] as patient asked.

## 2020-09-20 NOTE — Telephone Encounter (Signed)
Patient called the office to rescheduled her procedure - she also states the Protonix prescribed is not working for her - please advise - ph# [phone]

## 2020-09-20 NOTE — Telephone Encounter (Signed)
I called and spoke with the patient and she states she has already spoke with the office to reschedule her procedure. She states she has been having some nausea,and mid periumbilical abdominal pain, no vomiting, some reflux and sour taste in mouth,some diarrhea, no constipation nor dark or bloody stools, no fever nor vomiting. She states she has a history of ulcers and a hernia this feels just like the pain she has had in the past with the ulcers. 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 She states she was Covid tested and it was Negative. She is taking her Pantoprazole 40 mg BID and her dicyclomine with every meal up to TID. She has had Carafate in the past, but does not have any as of now. Please advise.

## 2020-09-20 NOTE — Telephone Encounter (Signed)
Can fill carafate 1 g po ac and qhs. 120 doses with 3RFs. If she bentyl we can fill for one month with three refills as well.

## 2020-10-02 NOTE — Patient Instructions (Signed)
61    Your procedure is scheduled on: 10/04/2020  Report to [facility] at 11:30    AM.  Call this number if you have problems the morning of surgery: [phone]   Remember:   Follow instructions on letter from office regarding when to stop eating and drinking        No Smoking the day of procedure      Take these medicines the morning of surgery with A SIP OF WATER: Pantoprazole and Tramadol   Do not wear jewelry, make-up or nail polish.  Do not wear lotions, powders, or perfumes. You may wear deodorant.                Do not bring valuables to the hospital.  Contacts, dentures or bridgework may not be worn into surgery.  Leave suitcase in the car. After surgery it may be brought to your room.  For patients admitted to the hospital, checkout time is 11:00 AM the day of discharge.   Patients discharged the day of surgery will not be allowed to drive home. Upper Endoscopy, Adult Upper endoscopy is a procedure to look inside the upper GI (gastrointestinal) tract. The upper GI tract is made up of:  The part of the body that moves food from your mouth to your stomach (esophagus).  The stomach.  The first part of your small intestine (duodenum). This procedure is also called esophagogastroduodenoscopy (EGD) or gastroscopy. In this procedure, your health care provider passes a thin, flexible tube (endoscope) through your mouth and down your esophagus into your stomach. A small camera is attached to the end of the tube. Images from the camera appear on a monitor in the exam room. During this procedure, your health care provider may also remove a small piece of tissue to be sent to a lab and examined under a microscope (biopsy). Your health care provider may do an upper endoscopy to diagnose cancers of the upper GI tract. You may also have this procedure to find the cause of other conditions, such as:  Stomach pain.  Heartburn.  Pain or problems when swallowing.  Nausea and  vomiting.  Stomach bleeding.  Stomach ulcers. Tell a health care provider about:  Any allergies you have.  All medicines you are taking, including vitamins, herbs, eye drops, creams, and over-the-counter medicines.  Any problems you or family members have had with anesthetic medicines.  Any blood disorders you have.  Any surgeries you have had.  Any medical conditions you have.  Whether you are pregnant or may be pregnant. What are the risks? Generally, this is a safe procedure. However, problems may occur, including:  Infection.  Bleeding.  Allergic reactions to medicines.  A tear or hole (perforation) in the esophagus, stomach, or duodenum. What happens before the procedure? Staying hydrated Follow instructions from your health care provider about hydration, which may include:  Up to 2 hours before the procedure - you may continue to drink clear liquids, such as water, clear fruit juice, black coffee, and plain tea.  Eating and drinking restrictions Follow instructions from your health care provider about eating and drinking, which may include:  8 hours before the procedure - stop eating heavy meals or foods, such as meat, fried foods, or fatty foods.  6 hours before the procedure - stop eating light meals or foods, such as toast or cereal.  6 hours before the procedure - stop drinking milk or drinks that contain milk.  2 hours before the procedure -  stop drinking clear liquids. Medicines Ask your health care provider about:  Changing or stopping your regular medicines. This is especially important if you are taking diabetes medicines or blood thinners.  Taking medicines such as aspirin and ibuprofen. These medicines can thin your blood. Do not take these medicines unless your health care provider tells you to take them.  Taking over-the-counter medicines, vitamins, herbs, and supplements. General instructions  Plan to have someone take you home from the  hospital or clinic.  If you will be going home right after the procedure, plan to have someone with you for 24 hours.  Ask your health care provider what steps will be taken to help prevent infection. What happens during the procedure?  1. An IV will be inserted into one of your veins. 2. You may be given one or more of the following: ? A medicine to help you relax (sedative). ? A medicine to numb the throat (local anesthetic). 3. You will lie on your left side on an exam table. 4. Your health care provider will pass the endoscope through your mouth and down your esophagus. 5. Your health care provider will use the scope to check the inside of your esophagus, stomach, and duodenum. Biopsies may be taken. 6. The endoscope will be removed. The procedure may vary among health care providers and hospitals. What happens after the procedure?  Your blood pressure, heart rate, breathing rate, and blood oxygen level will be monitored until you leave the hospital or clinic.  Do not drive for 24 hours if you were given a sedative during your procedure.  When your throat is no longer numb, you may be given some fluids to drink.  It is up to you to get the results of your procedure. Ask your health care provider, or the department that is doing the procedure, when your results will be ready. Summary  Upper endoscopy is a procedure to look inside the upper GI tract.  During the procedure, an IV will be inserted into one of your veins. You may be given a medicine to help you relax.  A medicine will be used to numb your throat.  The endoscope will be passed through your mouth and down your esophagus. This information is not intended to replace advice given to you by your health care provider. Make sure you discuss any questions you have with your health care provider. Document Revised: 03/04/2018 Document Reviewed: 02/09/2018 Elsevier Patient Education  [place] [facility]  Please read the instructions outlined below and refer to this sheet in the next few weeks. These discharge instructions provide you with general information on caring for yourself after you leave the hospital. Your doctor may also give  you specific instructions. While your treatment has been planned according to the most current medical practices available, unavoidable complications occasionally occur. If you have any problems or questions after discharge, please call your doctor. HOME CARE INSTRUCTIONS Activity  You may resume your regular activity but move at a slower pace for the next 24 hours.   Take frequent rest periods for the next 24 hours.   Walking will help expel (get rid of) the air and reduce the bloated feeling in your abdomen.   No driving for 24 hours (because of the anesthesia (medicine) used during the test).   You may shower.   Do not sign any important legal documents or operate any machinery for 24 hours (because of the anesthesia used during the test).  Nutrition  Drink plenty of fluids.   You may resume your normal diet.   Begin with a light meal and progress to your normal diet.   Avoid alcoholic beverages for 24 hours or as instructed by your caregiver.  Medications You may resume your normal medications unless your caregiver tells you otherwise. What you can expect today  You may experience abdominal discomfort such as a feeling of fullness or "gas" pains.   You may experience a sore throat for 2 to 3 days. This is normal. Gargling with salt water may help this.  Follow-up Your doctor will discuss the results of your test with you. SEEK IMMEDIATE MEDICAL CARE IF:  You have excessive nausea (feeling sick to your stomach) and/or vomiting.   You have severe abdominal pain and distention (swelling).   You have trouble  swallowing.   You have a temperature over 100 F (37.8 C).   You have rectal bleeding or vomiting of blood.  Document Released: 04/23/2004 Document Revised: 08/29/2011 Document Reviewed: 11/04/2007  [email or web address].asge.org/home/[email or web address]-patients/[email or web address]/[email or web address]-[email or web address]-[email or web address]">  Esophageal Dilatation Esophageal dilatation, also called esophageal dilation, is a procedure to widen or open a blocked or narrowed part of the esophagus. The esophagus is the part of the body that moves food and liquid from the mouth to the stomach. You may need this procedure if:  You have a buildup of scar tissue in your esophagus that makes it difficult, painful, or impossible to swallow. This can be caused by gastroesophageal reflux disease (GERD).  You have cancer of the esophagus.  There is a problem with how food moves through your esophagus. In some cases, you may need this procedure repeated at a later time to dilate the esophagus gradually. Tell a health care provider about:  Any allergies you have.  All medicines you are taking, including vitamins, herbs, eye drops, creams, and over-the-counter medicines.  Any problems you or family members have had with anesthetic medicines.  Any blood disorders you have.  Any surgeries you have had.  Any medical conditions you have.  Any antibiotic medicines you are required to take before dental procedures.  Whether you are pregnant or may be pregnant. What are the risks? Generally, this is a safe procedure. However, problems may occur, including:  Bleeding due to a tear in the lining of the esophagus.  A hole, or perforation, in the esophagus. What happens before the procedure?  Ask your health care provider about: ? Changing or stopping your regular medicines. This is especially important if you are taking diabetes medicines or blood thinners. ? Taking medicines such as aspirin and ibuprofen. These medicines can thin your blood. Do  not take these medicines unless your health care provider tells you to take them. ?  Taking over-the-counter medicines, vitamins, herbs, and supplements.  Follow instructions from your health care provider about eating or drinking restrictions.  Plan to have a responsible adult take you home from the hospital or clinic.  Plan to have a responsible adult care for you for the time you are told after you leave the hospital or clinic. This is important. What happens during the procedure?  You may be given a medicine to help you relax (sedative).  A numbing medicine may be sprayed into the back of your throat, or you may gargle the medicine.  Your health care provider may perform the dilatation using various surgical instruments, such as: ? Simple dilators. This instrument is carefully placed in the esophagus to stretch it. ? Guided wire bougies. This involves using an endoscope to insert a wire into the esophagus. A dilator is passed over this wire to enlarge the esophagus. Then the wire is removed. ? Balloon dilators. An endoscope with a small balloon is inserted into the esophagus. The balloon is inflated to stretch the esophagus and open it up. The procedure may vary among health care providers and hospitals. What can I expect after the procedure?  Your blood pressure, heart rate, breathing rate, and blood oxygen level will be monitored until you leave the hospital or clinic.  Your throat may feel slightly sore and numb. This will get better over time.  You will not be allowed to eat or drink until your throat is no longer numb.  When you are able to drink, urinate, and sit on the edge of the bed without nausea or dizziness, you may be able to return home. Follow these instructions at home:  Take over-the-counter and prescription medicines only as told by your health care provider.  If you were given a sedative during the procedure, it can affect you for several hours. Do not drive or  operate machinery until your health care provider says that it is safe.  Plan to have a responsible adult care for you for the time you are told. This is important.  Follow instructions from your health care provider about any eating or drinking restrictions.  Do not use any products that contain nicotine or tobacco, such as cigarettes, e-cigarettes, and chewing tobacco. If you need help quitting, ask your health care provider.  Keep all follow-up visits. This is important. Contact a health care provider if:  You have a fever.  You have pain that is not relieved by medicine. Get help right away if:  You have chest pain.  You have trouble breathing.  You have trouble swallowing.  You vomit blood.  You have black, tarry, or bloody stools. These symptoms may represent a serious problem that is an emergency. Do not wait to see if the symptoms will go away. Get medical help right away. Call your local emergency services (911 in the [place] Do not drive yourself to the hospital. Summary  Esophageal dilatation, also called esophageal dilation, is a procedure to widen or open a blocked or narrowed part of the esophagus.  Plan to have a responsible adult take you home from the hospital or clinic.  For this procedure, a numbing medicine may be sprayed into the back of your throat, or you may gargle the medicine.  Do not drive or operate machinery until your health care provider says that it is safe. This information is not intended to replace advice given to you by your health care provider. Make sure you discuss any questions you  have with your health care provider. Document Revised: 01/26/2020 Document Reviewed: 01/26/2020 Elsevier Patient Education  [place].

## 2020-10-03 ENCOUNTER — Other Ambulatory Visit: Payer: Self-pay

## 2020-10-03 ENCOUNTER — Encounter (HOSPITAL_COMMUNITY): Payer: Self-pay

## 2020-10-03 ENCOUNTER — Encounter (HOSPITAL_COMMUNITY)
Admission: RE | Admit: 2020-10-03 | Discharge: 2020-10-03 | Disposition: A | Discharge status: Home / Self Care | Payer: Medicaid Other | Source: Ambulatory Visit | Attending: Internal Medicine | Admitting: Internal Medicine

## 2020-10-03 ENCOUNTER — Other Ambulatory Visit (HOSPITAL_COMMUNITY)
Admission: RE | Admit: 2020-10-03 | Discharge: 2020-10-03 | Disposition: A | Discharge status: Home / Self Care | Payer: Medicaid Other | Source: Ambulatory Visit | Attending: Internal Medicine | Admitting: Internal Medicine

## 2020-10-03 DIAGNOSIS — Z01818 Encounter for other preprocedural examination: Secondary | ICD-10-CM | POA: Insufficient documentation

## 2020-10-03 DIAGNOSIS — Z20822 Contact with and (suspected) exposure to covid-19: Secondary | ICD-10-CM | POA: Diagnosis not present

## 2020-10-04 ENCOUNTER — Ambulatory Visit (HOSPITAL_COMMUNITY): Payer: Medicaid Other | Admitting: Anesthesiology

## 2020-10-04 ENCOUNTER — Ambulatory Visit (HOSPITAL_COMMUNITY)
Admission: RE | Admit: 2020-10-04 | Discharge: 2020-10-04 | Disposition: A | Discharge status: Home / Self Care | Payer: Medicaid Other | Attending: Internal Medicine | Admitting: Internal Medicine

## 2020-10-04 ENCOUNTER — Other Ambulatory Visit: Payer: Self-pay

## 2020-10-04 ENCOUNTER — Encounter (HOSPITAL_COMMUNITY): Payer: Self-pay | Admitting: Internal Medicine

## 2020-10-04 ENCOUNTER — Encounter (HOSPITAL_COMMUNITY)
Admission: RE | Disposition: A | Discharge status: Home / Self Care | Payer: Self-pay | Source: Home / Self Care | Attending: Internal Medicine

## 2020-10-04 DIAGNOSIS — K589 Irritable bowel syndrome without diarrhea: Secondary | ICD-10-CM | POA: Insufficient documentation

## 2020-10-04 DIAGNOSIS — Z880 Allergy status to penicillin: Secondary | ICD-10-CM | POA: Diagnosis not present

## 2020-10-04 DIAGNOSIS — K315 Obstruction of duodenum: Secondary | ICD-10-CM | POA: Diagnosis not present

## 2020-10-04 DIAGNOSIS — Z8711 Personal history of peptic ulcer disease: Secondary | ICD-10-CM | POA: Insufficient documentation

## 2020-10-04 DIAGNOSIS — R110 Nausea: Secondary | ICD-10-CM | POA: Diagnosis not present

## 2020-10-04 DIAGNOSIS — K3189 Other diseases of stomach and duodenum: Secondary | ICD-10-CM

## 2020-10-04 DIAGNOSIS — R1013 Epigastric pain: Secondary | ICD-10-CM

## 2020-10-04 DIAGNOSIS — Z882 Allergy status to sulfonamides status: Secondary | ICD-10-CM | POA: Insufficient documentation

## 2020-10-04 DIAGNOSIS — Z885 Allergy status to narcotic agent status: Secondary | ICD-10-CM | POA: Insufficient documentation

## 2020-10-04 DIAGNOSIS — F17210 Nicotine dependence, cigarettes, uncomplicated: Secondary | ICD-10-CM | POA: Diagnosis not present

## 2020-10-04 DIAGNOSIS — Z832 Family history of diseases of the blood and blood-forming organs and certain disorders involving the immune mechanism: Secondary | ICD-10-CM | POA: Diagnosis not present

## 2020-10-04 DIAGNOSIS — K219 Gastro-esophageal reflux disease without esophagitis: Secondary | ICD-10-CM | POA: Diagnosis not present

## 2020-10-04 DIAGNOSIS — K289 Gastrojejunal ulcer, unspecified as acute or chronic, without hemorrhage or perforation: Secondary | ICD-10-CM | POA: Insufficient documentation

## 2020-10-04 DIAGNOSIS — Z980 Intestinal bypass and anastomosis status: Secondary | ICD-10-CM | POA: Diagnosis not present

## 2020-10-04 DIAGNOSIS — K259 Gastric ulcer, unspecified as acute or chronic, without hemorrhage or perforation: Secondary | ICD-10-CM | POA: Insufficient documentation

## 2020-10-04 DIAGNOSIS — Z8249 Family history of ischemic heart disease and other diseases of the circulatory system: Secondary | ICD-10-CM | POA: Diagnosis not present

## 2020-10-04 DIAGNOSIS — Z79899 Other long term (current) drug therapy: Secondary | ICD-10-CM | POA: Diagnosis not present

## 2020-10-04 DIAGNOSIS — Z833 Family history of diabetes mellitus: Secondary | ICD-10-CM | POA: Insufficient documentation

## 2020-10-04 HISTORY — PX: ESOPHAGOGASTRODUODENOSCOPY (EGD) WITH PROPOFOL: SHX5813

## 2020-10-04 HISTORY — PX: ESOPHAGEAL DILATION: SHX303

## 2020-10-04 LAB — SARS CORONAVIRUS 2 (TAT 6-24 HRS): SARS Coronavirus 2: NEGATIVE

## 2020-10-04 SURGERY — ESOPHAGOGASTRODUODENOSCOPY (EGD) WITH PROPOFOL
Anesthesia: General

## 2020-10-04 MED ORDER — FENTANYL CITRATE (PF) 100 MCG/2ML IJ SOLN
INTRAMUSCULAR | Status: DC | PRN
  Administered 2020-10-04 (×2): 50 ug via INTRAVENOUS

## 2020-10-04 MED ORDER — CHLORHEXIDINE GLUCONATE CLOTH 2 % EX PADS: 6.0000 | MEDICATED_PAD | Freq: Once | CUTANEOUS | Status: DC

## 2020-10-04 MED ORDER — LIDOCAINE HCL (CARDIAC) PF 100 MG/5ML IV SOSY
PREFILLED_SYRINGE | INTRAVENOUS | Status: DC | PRN
  Administered 2020-10-04: 50 mg via INTRAVENOUS

## 2020-10-04 MED ORDER — PROPOFOL 10 MG/ML IV BOLUS
INTRAVENOUS | Status: DC | PRN
  Administered 2020-10-04: 100 mg via INTRAVENOUS
  Administered 2020-10-04: 50 mg via INTRAVENOUS

## 2020-10-04 MED ORDER — PROPOFOL 500 MG/50ML IV EMUL
INTRAVENOUS | Status: DC | PRN
  Administered 2020-10-04: 150 ug/kg/min via INTRAVENOUS

## 2020-10-04 MED ORDER — GLYCOPYRROLATE 0.2 MG/ML IJ SOLN
INTRAMUSCULAR | Status: AC
  Filled 2020-10-04: qty 1

## 2020-10-04 MED ORDER — LACTATED RINGERS IV SOLN: INTRAVENOUS | Status: DC | PRN

## 2020-10-04 MED ORDER — FENTANYL CITRATE (PF) 100 MCG/2ML IJ SOLN
INTRAMUSCULAR | Status: AC
  Filled 2020-10-04: qty 2

## 2020-10-04 MED ORDER — LACTATED RINGERS IV SOLN: Freq: Once | INTRAVENOUS | Status: AC

## 2020-10-04 MED ORDER — ONDANSETRON HCL 4 MG/2ML IJ SOLN
INTRAMUSCULAR | Status: AC
  Filled 2020-10-04: qty 2

## 2020-10-04 MED ORDER — STERILE WATER FOR IRRIGATION IR SOLN
Status: DC | PRN
  Administered 2020-10-04: 100 mL

## 2020-10-04 MED ORDER — ONDANSETRON HCL 4 MG/2ML IJ SOLN
INTRAMUSCULAR | Status: DC | PRN
  Administered 2020-10-04: 4 mg via INTRAVENOUS

## 2020-10-04 MED ORDER — LIDOCAINE VISCOUS HCL 2 % MT SOLN
15.0000 mL | Freq: Once | OROMUCOSAL | Status: AC
  Administered 2020-10-04: 15 mL via OROMUCOSAL

## 2020-10-04 MED ORDER — GLYCOPYRROLATE 0.2 MG/ML IJ SOLN
0.2000 mg | Freq: Once | INTRAMUSCULAR | Status: AC
  Administered 2020-10-04: 0.2 mg via INTRAVENOUS

## 2020-10-04 MED ORDER — LIDOCAINE VISCOUS HCL 2 % MT SOLN
OROMUCOSAL | Status: AC
  Filled 2020-10-04: qty 15

## 2020-10-04 NOTE — H&P (Addendum)
[name] is an 55 y.o. female.   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  Her last EGD was in September 2021 when she underwent esophageal dilation as well as balloon dilation of gastrojejunal anastomotic ulcer at the 12 mm.  She denies vomiting or melena.  She also complains of back pain over her upper and lower back.  She was seen by Dr. Woody Seller and felt to have back spasm but she says tramadol is not helping.  She reassures me that she is not taking any NSAIDs.  She has gained about 10 pounds since her last visit.  He does not drink alcohol but she continues to smoke cigarettes.  She is trying to quit.  She is now smoking 1 pack/day.  Past Medical History:  Diagnosis Date  . Anxiety   . Depression   . Essential hypertension, benign 12/26/2016  . Fibroadenoma of right breast    S/P LUMPECTOMY W/ RADIOACTIVE SEED IMPLANT 05-19-2015  . Fibromyalgia   . GERD (gastroesophageal reflux disease)   . History of cervical dysplasia    s/p conzation and recurrent s/p  vaginal hysterectomy 2008  . History of duodenal ulcer    1992--  S/P  REPAIR PERFORATED ULCER  . History of gastric ulcer    age 57  . Hypertension   . IBS (irritable bowel syndrome)   . Vaginal Pap smear, abnormal   . VIN II (vulvar intraepithelial neoplasia II)   . Wears glasses   . Wears partial dentures    lower    Past Surgical History:  Procedure Laterality Date  . BLADDER SLING PROCEDURE  2005  approx.  . CERVICAL CONIZATION W/BX  2006  approx  . CO2 LASER APPLICATION N/A 10/23/8655   Procedure: CO2 LASER OF THE VAGINA;  Surgeon: Everitt Amber, MD;  Location: [facility];  Service: Gynecology;  Laterality: N/A;  . ESOPHAGEAL DILATION  N/A 06/14/2020   Procedure: ESOPHAGEAL DILATION;  Surgeon: Rogene Houston, MD;  [facility];  Service: Endoscopy;  Laterality: N/A;  . ESOPHAGOGASTRODUODENOSCOPY N/A 01/21/2017   Procedure: ESOPHAGOGASTRODUODENOSCOPY (EGD);  Surgeon: Rogene Houston, MD;  [facility];  Service: Endoscopy;  Laterality: N/A;  . ESOPHAGOGASTRODUODENOSCOPY N/A 07/17/2017   Procedure: ESOPHAGOGASTRODUODENOSCOPY (EGD);  Surgeon: Rogene Houston, MD;  [facility];  Service: Endoscopy;  Laterality: N/A;  1:00  . ESOPHAGOGASTRODUODENOSCOPY N/A 10/16/2017   Procedure: ESOPHAGOGASTRODUODENOSCOPY (EGD)WITH DILATION;  Surgeon: Rogene Houston, MD;  [facility];  Service: Endoscopy;  Laterality: N/A;  1030  . ESOPHAGOGASTRODUODENOSCOPY (EGD) WITH PROPOFOL N/A 01/23/2017   Procedure: ESOPHAGOGASTRODUODENOSCOPY (EGD) WITH PROPOFOL;  Surgeon: Rogene Houston, MD;  [facility];  Service: Endoscopy;  Laterality: N/A;  duodenal striture dilation  . ESOPHAGOGASTRODUODENOSCOPY (EGD) WITH PROPOFOL N/A 06/14/2020   Procedure: ESOPHAGOGASTRODUODENOSCOPY (EGD) WITH PROPOFOL;  Surgeon: Rogene Houston, MD;  [facility];  Service: Endoscopy;  Laterality: N/A;  1055  . LAPAROSCOPY N/A 12/19/2017   Procedure: LAPAROSCOPY, PYLOROPLASTY WITH Silvestre Gunner;  Surgeon: Johnathan Hausen, MD;  Location: [facility];  Service: General;  Laterality: N/A;  . NASAL SEPTOPLASTY W/ TURBINOPLASTY Bilateral 04/30/2019   Procedure: BILATERAL NASAL SEPTOPLASTY WITH TURBINATE REDUCTION;  Surgeon: Leta Baptist, MD;  Location: [facility];  Service: ENT;  Laterality: Bilateral;  . RADIOACTIVE SEED GUIDED EXCISIONAL BREAST BIOPSY Right 05/19/2015   Procedure: EXCISIONAL RIGHT BREAST MASS WITH RADIOACTIVE SEED LOCALIZATION;  Surgeon: Fanny Skates, MD;  Location: [facility];  Service: General;  Laterality: Right;  . REPAIR OF PERFORATED ULCER  1992   duodenal  . VAGINAL  HYSTERECTOMY  11/13/2006    Family History  Problem Relation Age of Onset  . Hypertension Mother   . Clotting disorder Father   . Diabetes Father   . Hypertension Father   . Hypertension Sister   . Hypertension Brother   . Hypertension Maternal Aunt   . Hypertension Maternal Uncle   . Hypertension Paternal Aunt   . Hypertension Paternal Uncle    Social History:  reports that she has been smoking cigarettes. She has a 18.00 pack-year smoking history. She has never used smokeless tobacco. She reports that she does not drink alcohol and does not use drugs.  Allergies:  Allergies  Allergen Reactions  . Codeine Nausea Only  . Hydrocodone Nausea Only    feels hot and insomia  . Sulfa Antibiotics Hives  . Penicillin V Nausea Only    Medications Prior to Admission  Medication Sig Dispense Refill  . dicyclomine (BENTYL) 10 MG capsule Take 1 capsule (10 mg total) by mouth 3 (three) times daily before meals. 90 capsule 3  . estradiol (VIVELLE-DOT) 0.1 MG/24HR patch Place 1 patch (0.1 mg total) onto the skin 2 (two) times a week. 8 patch 12  . ketoconazole (NIZORAL) 2 % cream Apply 1 application topically daily. 30 g 1  . Multiple Vitamins-Minerals (CENTRUM ADULTS) TABS Take 1 tablet by mouth daily.     . pantoprazole (PROTONIX) 40 MG tablet Take 1 tablet (40 mg total) by mouth 2 (two) times daily before a meal. 60 tablet 5  . potassium chloride (MICRO-K) 10 MEQ CR capsule Take 10 mEq by mouth daily.    . sucralfate (CARAFATE) 1 g tablet Take po ac and Qhs (Patient taking differently: Take 1 g by mouth 3 (three) times daily before meals.) 120 tablet 3  . traMADol (ULTRAM) 50 MG tablet Take 50 mg by mouth every 6 (six) hours as needed for moderate pain.     . valACYclovir (VALTREX) 1000 MG tablet Take 1,000 mg by mouth daily.  5  . hydrOXYzine (ATARAX/VISTARIL) 10 MG tablet Take 1 tablet (10 mg total) by mouth 3 (three) times daily as needed. (Patient not taking: No sig reported) 45 tablet  1    Results for orders placed or performed during the hospital encounter of 10/03/20 (from the past 48 hour(s))  SARS CORONAVIRUS 2 (TAT 6-24 HRS) Nasopharyngeal Nasopharyngeal Swab     Status: None   Collection Time: 10/03/20 10:44 AM   Specimen: Nasopharyngeal Swab  Result Value Ref Range   SARS Coronavirus 2 NEGATIVE NEGATIVE    Comment: (NOTE) SARS-CoV-2 target nucleic acids are NOT DETECTED.  The SARS-CoV-2 RNA is generally detectable in upper and lower respiratory specimens during the acute phase of infection. Negative results do not preclude SARS-CoV-2 infection, do not rule out co-infections with other pathogens, and should not be used as the sole basis for treatment or other patient management decisions. Negative results must be combined with clinical observations, patient history, and epidemiological information. The expected result is Negative.  Fact Sheet for Patients: SugarRoll.be  Fact Sheet for Healthcare Providers: https://www.woods-mathews.com/  This test is not yet approved or cleared by the [place] FDA and  has been authorized for detection and/or diagnosis of SARS-CoV-2 by FDA under an Emergency Use Authorization (EUA). This EUA will remain  in effect (meaning this test can be used) for the duration of the COVID-19 declaration under Se ction 564(b)(1) of the Act, 21 U.S.C. section 360bbb-3(b)(1), unless the authorization is terminated or revoked sooner.  Performed at [facility], [place] [address], [place], [place] [address]    No results found.  Review of Systems  Blood pressure 123/86, temperature 98.2 F (36.8 C), temperature source Oral, resp. rate 16, height 5\' 4"  (1.626 m), weight 54.4 kg, SpO2 97 %. Physical Exam HENT:     Mouth/Throat:     Mouth: Mucous membranes are moist.     Pharynx: Oropharynx is clear.  Eyes:     General: No scleral icterus.    Conjunctiva/sclera: Conjunctivae  normal.  Cardiovascular:     Rate and Rhythm: Normal rate and regular rhythm.     Heart sounds: Normal heart sounds. No murmur heard.   Pulmonary:     Comments: Breath sounds are vesicular with few expiratory rhonchi bilaterally Abdominal:     Comments: Abdomen is symmetrical with upper midline scar.  Abdomen is soft.  She has mild midepigastric tenderness.  No organomegaly or masses.  Musculoskeletal:        General: No swelling.     Cervical back: Neck supple.  Lymphadenopathy:     Cervical: No cervical adenopathy.  Skin:    General: Skin is warm and dry.  Neurological:     Mental Status: She is alert.      Assessment/Plan  Epigastric pain and postprandial nausea. History of gastroduodenal anastomotic ulcer and stricture. Esophagogastroduodenoscopy with stricture dilation if indicated  Hildred Laser, MD 10/04/2020, 12:19 PM

## 2020-10-04 NOTE — Transfer of Care (Signed)
Immediate Anesthesia Transfer of Care Note  Patient: [name]  Procedure(s) Performed: ESOPHAGOGASTRODUODENOSCOPY (EGD) WITH PROPOFOL (N/A ) ESOPHAGEAL DILATION (N/A )  Patient Location: PACU  Anesthesia Type:General  Level of Consciousness: awake, alert  and oriented  Airway & Oxygen Therapy: Patient Spontanous Breathing  Post-op Assessment: Report given to RN and Post -op Vital signs reviewed and stable  Post vital signs: Reviewed and stable  Last Vitals:  Vitals Value Taken Time  BP 95/44 10/04/20 1245  Temp    Pulse 107 10/04/20 1246  Resp 19 10/04/20 1246  SpO2 99 % 10/04/20 1246  Vitals shown include unvalidated device data.  Last Pain:  Vitals:   10/04/20 1221  TempSrc:   PainSc: 8       Patients Stated Pain Goal: 9 (28/63/81 7711)  Complications: No complications documented.

## 2020-10-04 NOTE — Op Note (Signed)
[facility] Patient Name: [name] Procedure Date: 10/04/2020 12:04 PM MRN: [id number] Date of Birth: 1965/11/27 Attending MD: Hildred Laser , MD CSN: [id number] Age: 55 Admit Type: Outpatient Procedure:                Upper GI endoscopy Indications:              Epigastric abdominal pain, Follow-up of peptic                            ulcer, Nausea Providers:                Hildred Laser, MD, Crystal Page, Raphael Gibney,                            Technician Referring MD:             Glenda Chroman, MD Medicines:                Propofol per Anesthesia Complications:            No immediate complications. Estimated Blood Loss:     Estimated blood loss was minimal. Procedure:                Pre-Anesthesia Assessment:                           - Prior to the procedure, a History and Physical                            was performed, and patient medications and                            allergies were reviewed. The patient's tolerance of                            previous anesthesia was also reviewed. The risks                            and benefits of the procedure and the sedation                            options and risks were discussed with the patient.                            All questions were answered, and informed consent                            was obtained. Prior Anticoagulants: The patient has                            taken no previous anticoagulant or antiplatelet                            agents. ASA Grade Assessment: II - A patient with  mild systemic disease. After reviewing the risks                            and benefits, the patient was deemed in                            satisfactory condition to undergo the procedure.                           After obtaining informed consent, the endoscope was                            passed under direct vision. Throughout the                            procedure, the patient's  blood pressure, pulse, and                            oxygen saturations were monitored continuously. The                            [id number]-[id number]) scope was introduced through the                            mouth, and advanced to the second part of duodenum.                            The upper GI endoscopy was accomplished without                            difficulty. The patient tolerated the procedure                            well. The upper GI endoscopy was accomplished                            without difficulty. The patient tolerated the                            procedure well. Scope In: 12:27:24 PM Scope Out: 12:39:29 PM Total Procedure Duration: 0 hours 12 minutes 5 seconds  Findings:      The hypopharynx was normal.      The examined esophagus was normal.      The Z-line was regular and was found 40 cm from the incisors.      One non-bleeding cratered gastric ulcer with no stigmata of bleeding was       found in the gastric antrum.      One non-bleeding cratered gastric ulcer with no stigmata of bleeding was       found at the anastomosis. The lesion was six mm by ten mm in largest       dimension.      A benign-appearing, intrinsic moderate stenosis was found at the       anastomosis. This was traversed. A TTS dilator was passed through the  scope. Dilation with a 12-13.5-15 mm, a 12 mm and a 13.5 mm pyloric       balloon dilator was performed. The dilation site was examined and showed       mild mucosal disruption, moderate improvement in luminal narrowing and       no perforation.      The second portion of the duodenum was normal. Impression:               - Normal hypopharynx.                           - Normal esophagus.                           - Z-line regular, 40 cm from the incisors.                           - Non-bleeding gastric ulcer with no stigmata of                            bleeding.                           - Non-bleeding  gastroduodenal anastomotic ulcer                            with no stigmata of bleeding.                           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 Dilated.                           - Normal second portion of the duodenum.                           - No specimens collected.                           Comment: Both of these ulcers have decreased in                            size. Moderate Sedation:      Per Anesthesia Care Recommendation:           - Patient has a contact number available for                            emergencies. The signs and symptoms of potential                            delayed complications were discussed with the                            patient. Return to normal activities tomorrow.  Written discharge instructions were provided to the                            patient.                           - Resume previous diet today.                           - Continue present medications.                           - No aspirin, ibuprofen, naproxen, or other                            non-steroidal anti-inflammatory drugs.                           - Return to GI clinic in 3 months. Procedure Code(s):        --- Professional ---                           [phone], Esophagogastroduodenoscopy, flexible,                            transoral; with dilation of gastric/duodenal                            stricture(s) (eg, balloon, bougie) Diagnosis Code(s):        --- Professional ---                           K25.9, Gastric ulcer, unspecified as acute or                            chronic, without hemorrhage or perforation                           K31.89, Other diseases of stomach and duodenum                           R10.13, Epigastric pain                           K27.9, Peptic ulcer, site unspecified, unspecified                            as acute or chronic, without hemorrhage  or                            perforation                           R11.0, Nausea CPT copyright 2019 [employer]. All rights reserved. The codes documented in this report are preliminary and upon coder review may  be revised to meet current compliance requirements. Lionel December, MD Lionel December, MD 10/04/2020 1:03:57 PM This report  has been signed electronically. Number of Addenda: 0

## 2020-10-04 NOTE — Anesthesia Postprocedure Evaluation (Signed)
Anesthesia Post Note  Patient: [name]  Procedure(s) Performed: ESOPHAGOGASTRODUODENOSCOPY (EGD) WITH PROPOFOL (N/A ) ESOPHAGEAL DILATION (N/A )  Patient location during evaluation: PACU Anesthesia Type: General Level of consciousness: awake and alert and oriented Pain management: pain level controlled Vital Signs Assessment: post-procedure vital signs reviewed and stable Respiratory status: spontaneous breathing, nonlabored ventilation and respiratory function stable Cardiovascular status: stable and blood pressure returned to baseline Postop Assessment: no apparent nausea or vomiting Anesthetic complications: no   No complications documented.   Last Vitals:  Vitals:   10/04/20 1309 10/04/20 1320  BP:  137/76  Pulse: 94 89  Resp: 19   Temp:  36.7 C  SpO2: 98% 99%    Last Pain:  Vitals:   10/04/20 1320  TempSrc: Oral  PainSc: [place]

## 2020-10-04 NOTE — Anesthesia Procedure Notes (Signed)
Date/Time: 10/04/2020 12:29 PM Performed by: Orlie Dakin, CRNA Pre-anesthesia Checklist: Patient identified, Emergency Drugs available, Suction available and Patient being monitored Patient Re-evaluated:Patient Re-evaluated prior to induction Oxygen Delivery Method: Nasal cannula Induction Type: IV induction Placement Confirmation: positive ETCO2

## 2020-10-04 NOTE — Anesthesia Preprocedure Evaluation (Signed)
Anesthesia Evaluation  Patient identified by MRN, date of birth, ID band Patient awake    Reviewed: Allergy & Precautions, NPO status , Patient's Chart, lab work & pertinent test results  History of Anesthesia Complications Negative for: history of anesthetic complications  Airway Mallampati: II  TM Distance: >3 FB Neck ROM: Full    Dental  (+) Dental Advisory Given, Partial Lower, Missing, Loose,    Pulmonary Current SmokerPatient did not abstain from smoking.,    Pulmonary exam normal breath sounds clear to auscultation       Cardiovascular Exercise Tolerance: Good hypertension, Pt. on medications Normal cardiovascular exam Rhythm:Regular Rate:Normal     Neuro/Psych PSYCHIATRIC DISORDERS Anxiety Depression  Neuromuscular disease    GI/Hepatic PUD, GERD  Medicated,  Endo/Other    Renal/GU      Musculoskeletal  (+) Fibromyalgia -  Abdominal   Peds  Hematology   Anesthesia Other Findings   Reproductive/Obstetrics                             Anesthesia Physical  Anesthesia Plan  ASA: II  Anesthesia Plan: General   Post-op Pain Management:    Induction: Intravenous  PONV Risk Score and Plan: TIVA  Airway Management Planned: Nasal Cannula and Natural Airway  Additional Equipment:   Intra-op Plan:   Post-operative Plan:   Informed Consent: I have reviewed the patients History and Physical, chart, labs and discussed the procedure including the risks, benefits and alternatives for the proposed anesthesia with the patient or authorized representative who has indicated his/her understanding and acceptance.     Dental advisory given  Plan Discussed with: CRNA and Surgeon  Anesthesia Plan Comments:         Anesthesia Quick Evaluation

## 2020-10-04 NOTE — Discharge Instructions (Signed)
Upper Endoscopy, Adult, Care After This sheet gives you information about how to care for yourself after your procedure. Your health care provider may also give you more specific instructions. If you have problems or questions, contact your health care provider. What can I expect after the procedure? After the procedure, it is common to have:  A sore throat.  Mild stomach pain or discomfort.  Bloating.  Nausea. Follow these instructions at home:  Follow instructions from your health care provider about what to eat or drink after your procedure.  Return to your normal activities as told by your health care provider. Ask your health care provider what activities are safe for you.  Take over-the-counter and prescription medicines only as told by your health care provider.  If you were given a sedative during the procedure, it can affect you for several hours. Do not drive or operate machinery until your health care provider says that it is safe.  Keep all follow-up visits as told by your health care provider. This is important.   Contact a health care provider if you have:  A sore throat that lasts longer than one day.  Trouble swallowing. Get help right away if:  You vomit blood or your vomit looks like coffee grounds.  You have: ? A fever. ? Bloody, black, or tarry stools. ? A severe sore throat or you cannot swallow. ? Difficulty breathing. ? Severe pain in your chest or abdomen. Summary  After the procedure, it is common to have a sore throat, mild stomach discomfort, bloating, and nausea.  If you were given a sedative during the procedure, it can affect you for several hours. Do not drive or operate machinery until your health care provider says that it is safe.  Follow instructions from your health care provider about what to eat or drink after your procedure.  Return to your normal activities as told by your health care provider. This information is not intended to  replace advice given to you by your health care provider. Make sure you discuss any questions you have with your health care provider. Document Revised: 09/07/2019 Document Reviewed: 02/09/2018 Elsevier Patient Education  2021 [place].    No aspirin or NSAIDs No usual medications and diet as before. No driving for 24 hours. Office visit in 3 months.   https://www.asge.org/home/for-patients/patient-information/understanding-eso-dilation-updated">  Esophageal Dilatation Esophageal dilatation, also called esophageal dilation, is a procedure to widen or open a blocked or narrowed part of the esophagus. The esophagus is the part of the body that moves food and liquid from the mouth to the stomach. You may need this procedure if:  You have a buildup of scar tissue in your esophagus that makes it difficult, painful, or impossible to swallow. This can be caused by gastroesophageal reflux disease (GERD).  You have cancer of the esophagus.  There is a problem with how food moves through your esophagus. In some cases, you may need this procedure repeated at a later time to dilate the esophagus gradually. Tell a health care provider about:  Any allergies you have.  All medicines you are taking, including vitamins, herbs, eye drops, creams, and over-the-counter medicines.  Any problems you or family members have had with anesthetic medicines.  Any blood disorders you have.  Any surgeries you have had.  Any medical conditions you have.  Any antibiotic medicines you are required to take before dental procedures.  Whether you are pregnant or may be pregnant. What are the risks? Generally, this is  a safe procedure. However, problems may occur, including:  Bleeding due to a tear in the lining of the esophagus.  A hole, or perforation, in the esophagus. What happens before the procedure?  Ask your health care provider about: ? Changing or stopping your regular medicines. This is  especially important if you are taking diabetes medicines or blood thinners. ? Taking medicines such as aspirin and ibuprofen. These medicines can thin your blood. Do not take these medicines unless your health care provider tells you to take them. ? Taking over-the-counter medicines, vitamins, herbs, and supplements.  Follow instructions from your health care provider about eating or drinking restrictions.  Plan to have a responsible adult take you home from the hospital or clinic.  Plan to have a responsible adult care for you for the time you are told after you leave the hospital or clinic. This is important. What happens during the procedure?  You may be given a medicine to help you relax (sedative).  A numbing medicine may be sprayed into the back of your throat, or you may gargle the medicine.  Your health care provider may perform the dilatation using various surgical instruments, such as: ? Simple dilators. This instrument is carefully placed in the esophagus to stretch it. ? Guided wire bougies. This involves using an endoscope to insert a wire into the esophagus. A dilator is passed over this wire to enlarge the esophagus. Then the wire is removed. ? Balloon dilators. An endoscope with a small balloon is inserted into the esophagus. The balloon is inflated to stretch the esophagus and open it up. The procedure may vary among health care providers and hospitals. What can I expect after the procedure?  Your blood pressure, heart rate, breathing rate, and blood oxygen level will be monitored until you leave the hospital or clinic.  Your throat may feel slightly sore and numb. This will get better over time.  You will not be allowed to eat or drink until your throat is no longer numb.  When you are able to drink, urinate, and sit on the edge of the bed without nausea or dizziness, you may be able to return home. Follow these instructions at home:  Take over-the-counter and  prescription medicines only as told by your health care provider.  If you were given a sedative during the procedure, it can affect you for several hours. Do not drive or operate machinery until your health care provider says that it is safe.  Plan to have a responsible adult care for you for the time you are told. This is important.  Follow instructions from your health care provider about any eating or drinking restrictions.  Do not use any products that contain nicotine or tobacco, such as cigarettes, e-cigarettes, and chewing tobacco. If you need help quitting, ask your health care provider.  Keep all follow-up visits. This is important. Contact a health care provider if:  You have a fever.  You have pain that is not relieved by medicine. Get help right away if:  You have chest pain.  You have trouble breathing.  You have trouble swallowing.  You vomit blood.  You have black, tarry, or bloody stools. These symptoms may represent a serious problem that is an emergency. Do not wait to see if the symptoms will go away. Get medical help right away. Call your local emergency services (911 in the [place] Do not drive yourself to the hospital. Summary  Esophageal dilatation, also called esophageal dilation, is  a procedure to widen or open a blocked or narrowed part of the esophagus.  Plan to have a responsible adult take you home from the hospital or clinic.  For this procedure, a numbing medicine may be sprayed into the back of your throat, or you may gargle the medicine.  Do not drive or operate machinery until your health care provider says that it is safe. This information is not intended to replace advice given to you by your health care provider. Make sure you discuss any questions you have with your health care provider. Document Revised: 01/26/2020 Document Reviewed: 01/26/2020 Elsevier Patient Education  [place].

## 2020-10-06 ENCOUNTER — Encounter (HOSPITAL_COMMUNITY): Payer: Self-pay | Admitting: Internal Medicine

## 2020-10-06 ENCOUNTER — Encounter (INDEPENDENT_AMBULATORY_CARE_PROVIDER_SITE_OTHER): Payer: Self-pay

## 2020-10-12 ENCOUNTER — Telehealth: Payer: Self-pay

## 2020-10-12 NOTE — Telephone Encounter (Signed)
2 weeks ago pt started hurting and has continued to hurt has had discharge, was treated for yeast infection. Pt stated she is having cramping and bloody discharge with clotting. Pt has had a partial hysterectomy

## 2020-10-13 ENCOUNTER — Telehealth: Payer: Self-pay | Admitting: *Deleted

## 2020-10-13 NOTE — Telephone Encounter (Signed)
Patient states she went to urgent care last night and tested her urine which was negative.  States she is in a lot of pain on her lower right side and wants to be seen today.  I have informed her that we do not have any openings today. States she does not want to go back to Urgent Care.  Please advise.

## 2020-10-17 ENCOUNTER — Other Ambulatory Visit: Payer: Self-pay

## 2020-10-17 ENCOUNTER — Encounter: Payer: Self-pay | Admitting: Obstetrics & Gynecology

## 2020-10-17 ENCOUNTER — Ambulatory Visit (INDEPENDENT_AMBULATORY_CARE_PROVIDER_SITE_OTHER): Payer: Medicaid Other | Admitting: Obstetrics & Gynecology

## 2020-10-17 VITALS — BP 134/89 | HR 91 | Ht 64.0 in | Wt 122.0 lb

## 2020-10-17 DIAGNOSIS — N9489 Other specified conditions associated with female genital organs and menstrual cycle: Secondary | ICD-10-CM | POA: Diagnosis not present

## 2020-10-17 DIAGNOSIS — M62830 Muscle spasm of back: Secondary | ICD-10-CM

## 2020-10-17 MED ORDER — CYCLOBENZAPRINE HCL 10 MG PO TABS: 10.0000 mg | ORAL_TABLET | Freq: Three times a day (TID) | ORAL | 1 refills | Status: DC | PRN

## 2020-10-17 NOTE — Progress Notes (Signed)
Chief Complaint  Patient presents with  . Vaginal Bleeding      55 y.o. G2P1011 No LMP recorded. Patient has had a hysterectomy. The current method of family planning is status post hysterectomy.  Outpatient Encounter Medications as of 10/17/2020  Medication Sig  . cyclobenzaprine (FLEXERIL) 10 MG tablet Take 1 tablet (10 mg total) by mouth every 8 (eight) hours as needed for muscle spasms.  Marland Kitchen dicyclomine (BENTYL) 10 MG capsule Take 1 capsule (10 mg total) by mouth 3 (three) times daily before meals.  Marland Kitchen estradiol (VIVELLE-DOT) 0.1 MG/24HR patch Place 1 patch (0.1 mg total) onto the skin 2 (two) times a week.  Marland Kitchen ketoconazole (NIZORAL) 2 % cream Apply 1 application topically daily.  . Multiple Vitamins-Minerals (CENTRUM ADULTS) TABS Take 1 tablet by mouth daily.   . pantoprazole (PROTONIX) 40 MG tablet Take 1 tablet (40 mg total) by mouth 2 (two) times daily before a meal.  . potassium chloride (MICRO-K) 10 MEQ CR capsule Take 10 mEq by mouth daily.  . sertraline (ZOLOFT) 50 MG tablet Take 50 mg by mouth daily.  . sucralfate (CARAFATE) 1 g tablet Take po ac and Qhs (Patient taking differently: Take 1 g by mouth 3 (three) times daily before meals.)  . traMADol (ULTRAM) 50 MG tablet Take 50 mg by mouth every 6 (six) hours as needed for moderate pain.   . valACYclovir (VALTREX) 1000 MG tablet Take 1,000 mg by mouth daily.   No facility-administered encounter medications on file as of 10/17/2020.    Subjective Pt with right back pain with radiation to the front Also right lower quadrant pain +dyspareunia Past Medical History:  Diagnosis Date  . Anxiety   . Depression   . Essential hypertension, benign 12/26/2016  . Fibroadenoma of right breast    S/P LUMPECTOMY W/ RADIOACTIVE SEED IMPLANT 05-19-2015  . Fibromyalgia   . GERD (gastroesophageal reflux disease)   . History of cervical dysplasia    s/p conzation and recurrent s/p  vaginal hysterectomy 2008  . History of duodenal  ulcer    1992--  S/P  REPAIR PERFORATED ULCER  . History of gastric ulcer    age 2  . Hypertension   . IBS (irritable bowel syndrome)   . Vaginal Pap smear, abnormal   . VIN II (vulvar intraepithelial neoplasia II)   . Wears glasses   . Wears partial dentures    lower    Past Surgical History:  Procedure Laterality Date  . BLADDER SLING PROCEDURE  2005  approx.  . CERVICAL CONIZATION W/BX  2006  approx  . CO2 LASER APPLICATION N/A 1/44/8185   Procedure: CO2 LASER OF THE VAGINA;  Surgeon: Everitt Amber, MD;  Location: [facility];  Service: Gynecology;  Laterality: N/A;  . ESOPHAGEAL DILATION N/A 06/14/2020   Procedure: ESOPHAGEAL DILATION;  Surgeon: Rogene Houston, MD;  [facility];  Service: Endoscopy;  Laterality: N/A;  . ESOPHAGEAL DILATION N/A 10/04/2020   Procedure: ESOPHAGEAL DILATION;  Surgeon: Rogene Houston, MD;  [facility];  Service: Endoscopy;  Laterality: N/A;  . ESOPHAGOGASTRODUODENOSCOPY N/A 01/21/2017   Procedure: ESOPHAGOGASTRODUODENOSCOPY (EGD);  Surgeon: Rogene Houston, MD;  [facility];  Service: Endoscopy;  Laterality: N/A;  . ESOPHAGOGASTRODUODENOSCOPY N/A 07/17/2017   Procedure: ESOPHAGOGASTRODUODENOSCOPY (EGD);  Surgeon: Rogene Houston, MD;  [facility];  Service: Endoscopy;  Laterality: N/A;  1:00  . ESOPHAGOGASTRODUODENOSCOPY N/A 10/16/2017   Procedure: ESOPHAGOGASTRODUODENOSCOPY (EGD)WITH  DILATION;  Surgeon: Rogene Houston, MD;  [facility];  Service: Endoscopy;  Laterality: N/A;  1030  . ESOPHAGOGASTRODUODENOSCOPY (EGD) WITH PROPOFOL N/A 01/23/2017   Procedure: ESOPHAGOGASTRODUODENOSCOPY (EGD) WITH PROPOFOL;  Surgeon: Rogene Houston, MD;  [facility];  Service: Endoscopy;  Laterality: N/A;  duodenal striture dilation  . ESOPHAGOGASTRODUODENOSCOPY (EGD) WITH PROPOFOL N/A 06/14/2020   Procedure: ESOPHAGOGASTRODUODENOSCOPY (EGD) WITH PROPOFOL;  Surgeon: Rogene Houston,  MD;  [facility];  Service: Endoscopy;  Laterality: N/A;  1055  . ESOPHAGOGASTRODUODENOSCOPY (EGD) WITH PROPOFOL N/A 10/04/2020   Procedure: ESOPHAGOGASTRODUODENOSCOPY (EGD) WITH PROPOFOL;  Surgeon: Rogene Houston, MD;  [facility];  Service: Endoscopy;  Laterality: N/A;  1:00  . LAPAROSCOPY N/A 12/19/2017   Procedure: LAPAROSCOPY, PYLOROPLASTY WITH Silvestre Gunner;  Surgeon: Johnathan Hausen, MD;  [facility];  Service: General;  Laterality: N/A;  . NASAL SEPTOPLASTY W/ TURBINOPLASTY Bilateral 04/30/2019   Procedure: BILATERAL NASAL SEPTOPLASTY WITH TURBINATE REDUCTION;  Surgeon: Leta Baptist, MD;  Location: [place];  Service: ENT;  Laterality: Bilateral;  . RADIOACTIVE SEED GUIDED EXCISIONAL BREAST BIOPSY Right 05/19/2015   Procedure: EXCISIONAL RIGHT BREAST MASS WITH RADIOACTIVE SEED LOCALIZATION;  Surgeon: Fanny Skates, MD;  Location: [place];  Service: General;  Laterality: Right;  . REPAIR OF PERFORATED ULCER  1992   duodenal  . VAGINAL HYSTERECTOMY  11/13/2006    OB History    Gravida  2   Para  1   Term  1   Preterm      AB  1   Living  1     SAB  1   IAB      Ectopic      Multiple      Live Births  1           Allergies  Allergen Reactions  . Codeine Nausea Only  . Hydrocodone Nausea Only    feels hot and insomia  . Sulfa Antibiotics Hives  . Penicillin V Nausea Only    Social History   Socioeconomic History  . Marital status: Significant Other    Spouse name: Not on file  . Number of children: Not on file  . Years of education: Not on file  . Highest education level: Not on file  Occupational History  . Not on file  Tobacco Use  . Smoking status: Current Every Day Smoker    Packs/day: 1.00    Years: 18.00    Pack years: 18.00    Types: Cigarettes  . Smokeless tobacco: Never Used  Vaping Use  . Vaping Use: Never used  Substance and Sexual Activity  . Alcohol use: No  . Drug use:  No  . Sexual activity: Not Currently    Birth control/protection: Surgical    Comment: hyst  Other Topics Concern  . Not on file  Social History Narrative  . Not on file   Social Determinants of Health   Financial Resource Strain: High Risk  . Difficulty of Paying Living Expenses: Hard  Food Insecurity: No Food Insecurity  . Worried About Charity fundraiser in the Last Year: Never true  . Ran Out of Food in the Last Year: Never true  Transportation Needs: No Transportation Needs  . Lack of Transportation (Medical): No  . Lack of Transportation (Non-Medical): No  Physical Activity: Inactive  . Days of Exercise per Week: 0 days  . Minutes of Exercise per Session: 10 min  Stress: Stress Concern Present  . Feeling of Stress : Very much  Social Connections: Socially Isolated  . Frequency of Communication with Friends and Family: Twice a week  . Frequency of Social Gatherings with Friends and Family: Never  . Attends Religious Services: Never  . Active Member of Clubs or Organizations: No  . Attends Archivist Meetings: Never  . Marital Status: Divorced    Family History  Problem Relation Age of Onset  . Hypertension Mother   . Clotting disorder Father   . Diabetes Father   . Hypertension Father   . Hypertension Sister   . Hypertension Brother   . Hypertension Maternal Aunt   . Hypertension Maternal Uncle   . Hypertension Paternal Aunt   . Hypertension Paternal Uncle     Medications:       Current Outpatient Medications:  .  cyclobenzaprine (FLEXERIL) 10 MG tablet, Take 1 tablet (10 mg total) by mouth every 8 (eight) hours as needed for muscle spasms., Disp: 30 tablet, Rfl: 1 .  dicyclomine (BENTYL) 10 MG capsule, Take 1 capsule (10 mg total) by mouth 3 (three) times daily before meals., Disp: 90 capsule, Rfl: 3 .  estradiol (VIVELLE-DOT) 0.1 MG/24HR patch, Place 1 patch (0.1 mg total) onto the skin 2 (two) times a week., Disp: 8 patch, Rfl: 12 .  ketoconazole  (NIZORAL) 2 % cream, Apply 1 application topically daily., Disp: 30 g, Rfl: 1 .  Multiple Vitamins-Minerals (CENTRUM ADULTS) TABS, Take 1 tablet by mouth daily. , Disp: , Rfl:  .  pantoprazole (PROTONIX) 40 MG tablet, Take 1 tablet (40 mg total) by mouth 2 (two) times daily before a meal., Disp: 60 tablet, Rfl: 5 .  potassium chloride (MICRO-K) 10 MEQ CR capsule, Take 10 mEq by mouth daily., Disp: , Rfl:  .  sertraline (ZOLOFT) 50 MG tablet, Take 50 mg by mouth daily., Disp: , Rfl:  .  sucralfate (CARAFATE) 1 g tablet, Take po ac and Qhs (Patient taking differently: Take 1 g by mouth 3 (three) times daily before meals.), Disp: 120 tablet, Rfl: 3 .  traMADol (ULTRAM) 50 MG tablet, Take 50 mg by mouth every 6 (six) hours as needed for moderate pain. , Disp: , Rfl:  .  valACYclovir (VALTREX) 1000 MG tablet, Take 1,000 mg by mouth daily., Disp: , Rfl: 5  Objective Blood pressure 134/89, pulse 91, height 5\' 4"  (1.626 m), weight 122 lb (55.3 kg).  General WDWN female NAD Vulva:  normal appearing vulva with no masses, tenderness or lesions Vagina:  normal mucosa, no discharge Cervix:  absent Uterus:  absent Adnexa: right ovary adherent ot vaginal cuff with tenderness  +paraspinous back pains   Pertinent ROS No burning with urination, frequency or urgency No nausea, vomiting or diarrhea Nor fever chills or other constitutional symptoms   Labs or studies     Impression Diagnoses this Encounter::   ICD-10-CM   1. Back spasm  M62.830 US PELVIS TRANSVAGINAL NON-OB (TV ONLY)    US PELVIS (TRANSABDOMINAL ONLY)  2. Pain of ovary, right  N94.89 US PELVIS TRANSVAGINAL NON-OB (TV ONLY)    US PELVIS (TRANSABDOMINAL ONLY)    Established relevant diagnosis(es):   Plan/Recommendations: Meds ordered this encounter  Medications  . cyclobenzaprine (FLEXERIL) 10 MG tablet    Sig: Take 1 tablet (10 mg total) by mouth every 8 (eight) hours as needed for muscle spasms.    Dispense:  30 tablet     Refill:  1  Labs or Scans Ordered: Orders Placed This Encounter  Procedures  . US PELVIS TRANSVAGINAL NON-OB (TV ONLY)  . US PELVIS (TRANSABDOMINAL ONLY)    Management:: Sonogram to evaluate right ovary Flexeril for back and referred to Dr Bethann Goo for back/fibromyalgia  Follow up Return in about 4 weeks (around 11/14/2020) for GYN sono, Follow up, with Dr Elonda Husky.      All questions were answered.

## 2020-10-20 ENCOUNTER — Encounter (INDEPENDENT_AMBULATORY_CARE_PROVIDER_SITE_OTHER): Payer: Self-pay

## 2020-10-25 ENCOUNTER — Encounter (INDEPENDENT_AMBULATORY_CARE_PROVIDER_SITE_OTHER): Payer: Self-pay | Admitting: Gastroenterology

## 2020-11-20 ENCOUNTER — Encounter (INDEPENDENT_AMBULATORY_CARE_PROVIDER_SITE_OTHER): Payer: Self-pay

## 2020-11-21 ENCOUNTER — Ambulatory Visit: Payer: Medicaid Other | Admitting: Obstetrics & Gynecology

## 2020-11-21 ENCOUNTER — Other Ambulatory Visit: Payer: Medicaid Other

## 2020-11-22 ENCOUNTER — Telehealth (INDEPENDENT_AMBULATORY_CARE_PROVIDER_SITE_OTHER): Payer: Self-pay | Admitting: *Deleted

## 2020-11-22 NOTE — Telephone Encounter (Signed)
Patient has sent some patient advice messages. This is about her throat being sore and it is painful. She shares that she is concerned that she may have a tier in her throat. Dr. Laural Golden reviewed the patient's EGD . He ask on 11/21/2020 that the patient restart the Carafate.as directed.   Refer to the patient's Patient Advice messages and responses.  Note after the last message was left and the patient was also talked to by Gustavus Bryant , [place] patient called and left a message asking if Dr.Rehman would be in the office today or tomorrow? If he would give her a slip of paper to go to the ED.  I called the patient back. I got her voice mail. I told her that Dr.Rehman was not in the office today of tomorrow. That he would not give her a paper to take to the ED. That the hospitlist that saw her , if he needed to reach her GI he would. Also Dr.Rehman had given the recommendation that she could see her PCP about the painful sore throat.

## 2020-11-23 ENCOUNTER — Encounter (INDEPENDENT_AMBULATORY_CARE_PROVIDER_SITE_OTHER): Payer: Self-pay | Admitting: Gastroenterology

## 2020-11-23 ENCOUNTER — Telehealth (INDEPENDENT_AMBULATORY_CARE_PROVIDER_SITE_OTHER): Payer: Self-pay

## 2020-11-23 ENCOUNTER — Other Ambulatory Visit: Payer: Self-pay

## 2020-11-23 ENCOUNTER — Ambulatory Visit (INDEPENDENT_AMBULATORY_CARE_PROVIDER_SITE_OTHER): Payer: Medicaid Other | Admitting: Gastroenterology

## 2020-11-23 ENCOUNTER — Other Ambulatory Visit (INDEPENDENT_AMBULATORY_CARE_PROVIDER_SITE_OTHER): Payer: Self-pay

## 2020-11-23 ENCOUNTER — Encounter (INDEPENDENT_AMBULATORY_CARE_PROVIDER_SITE_OTHER): Payer: Self-pay

## 2020-11-23 VITALS — BP 124/80 | HR 97 | Temp 97.8°F | Ht 64.0 in | Wt 124.0 lb

## 2020-11-23 DIAGNOSIS — Z8711 Personal history of peptic ulcer disease: Secondary | ICD-10-CM

## 2020-11-23 DIAGNOSIS — K589 Irritable bowel syndrome without diarrhea: Secondary | ICD-10-CM

## 2020-11-23 DIAGNOSIS — Z1211 Encounter for screening for malignant neoplasm of colon: Secondary | ICD-10-CM

## 2020-11-23 DIAGNOSIS — R1084 Generalized abdominal pain: Secondary | ICD-10-CM

## 2020-11-23 MED ORDER — PEG 3350-KCL-NA BICARB-NACL 420 G PO SOLR: 4000.0000 mL | ORAL | 0 refills | Status: DC

## 2020-11-23 MED ORDER — HYOSCYAMINE SULFATE 0.125 MG SL SUBL: 0.1250 mg | SUBLINGUAL_TABLET | Freq: Four times a day (QID) | SUBLINGUAL | 2 refills | Status: DC | PRN

## 2020-11-23 NOTE — Telephone Encounter (Signed)
[name], CMA  

## 2020-11-23 NOTE — Patient Instructions (Signed)
Schedule EGD and colonoscopy Schedule CT abdomen/pelvis with IV contrast  Patient was counseled about the benefit of implementing a low FODMAP to improve symptoms and recurrent episodes. A dietary list was provided to the patient.  Start Levsin 1 tablet q8h as needed for abdominal pain Stop dicyclomine

## 2020-11-23 NOTE — Progress Notes (Signed)
Maylon Peppers, M.D. Gastroenterology & Hepatology [facility] [address] [place], [place] [address]  Primary Care Physician: Glenda Chroman, MD [place] [id number]  I will communicate my assessment and recommendations to the referring MD via EMR.  Problems: 1. R recurrent chest and abdominal pain 2. duodenal ulcers complicated by perforation requiring a Phillip Heal patch placement and subsequent pyloroplasty and dilations  History of Present Illness: [name] is a 55 y.o. female with past medical history of fibromyalgia, depression, anxiety, history of duodenal ulcers complicated by perforation requiring a Graham patch placement and subsequent pyloroplasty, hypertension and IBS, who presents for follow up of recurrent episodes of pain in the chest and abdomen.  The patient was last seen on 03/30/2020. At that time, the patient She was scheduled for an EGD. She underwent EGD on 10/04/2020 which showed 1 gastric ulcer in the antrum which was not bleeding, there was a second gastric ulcer measuring 6 x 10 mm at the level of the anastomosis along with a stricture of the anastomosis which was dilated up to 13.5 mm.  Patient reports that she has presented recurrent episodes of sharp pain in her retrosternal area as well as in her left upper abdomen which have been very painful and are intermittent in nature. States that for the last week these episodes have been present the whole time and have not let her sleep adequately. She reports that she has had similar symptoms on and off for the last 15 years which usually "improves after she has one of the anastomotic dilations" but she has only had 2 of these dilations in the past. She describes the pain as intermittent pain in the L side of her abdomen, along with retrosternal stabbing pain, in the middle of her chest and the sides of her lower rib cage. She feels slightly nausea now, but denies  any vomiting.  Patient takes tramadol for fibromyalgia. Notably, she has tried dicyclomine, pantoprazole or sucralfate previously without any improvement of the pain.  She does not follow any type of diet.  The patient denies having any nausea, vomiting, fever, chills, hematochezia, melena, hematemesis, abdominal distention, abdominal pain, diarrhea, jaundice, pruritus. Has been gaining some weight, but states that has decreased her intake of food for the last month.  Last Colonoscopy: > 10 years ago, normal per the patient  Had history of perforated ulcer with Phillip Heal patch by Dr. Tamala Julian in 1990, underwent pyloroplasty in 2018  Past Medical History: Past Medical History:  Diagnosis Date  . Anxiety   . Depression   . Essential hypertension, benign 12/26/2016  . Fibroadenoma of right breast    S/P LUMPECTOMY W/ RADIOACTIVE SEED IMPLANT 05-19-2015  . Fibromyalgia   . GERD (gastroesophageal reflux disease)   . History of cervical dysplasia    s/p conzation and recurrent s/p  vaginal hysterectomy 2008  . History of duodenal ulcer    1992--  S/P  REPAIR PERFORATED ULCER  . History of gastric ulcer    age 66  . Hypertension   . IBS (irritable bowel syndrome)   . Vaginal Pap smear, abnormal   . VIN II (vulvar intraepithelial neoplasia II)   . Wears glasses   . Wears partial dentures    lower    Past Surgical History: Past Surgical History:  Procedure Laterality Date  . BLADDER SLING PROCEDURE  2005  approx.  . CERVICAL CONIZATION W/BX  2006  approx  . CO2 LASER  APPLICATION N/A 1/85/6314   Procedure: CO2 LASER OF THE VAGINA;  Surgeon: Everitt Amber, MD;  Location: [facility];  Service: Gynecology;  Laterality: N/A;  . ESOPHAGEAL DILATION N/A 06/14/2020   Procedure: ESOPHAGEAL DILATION;  Surgeon: Rogene Houston, MD;  [facility];  Service: Endoscopy;  Laterality: N/A;  . ESOPHAGEAL DILATION N/A 10/04/2020   Procedure: ESOPHAGEAL DILATION;  Surgeon: Rogene Houston, MD;  [facility];  Service: Endoscopy;  Laterality: N/A;  . ESOPHAGOGASTRODUODENOSCOPY N/A 01/21/2017   Procedure: ESOPHAGOGASTRODUODENOSCOPY (EGD);  Surgeon: Rogene Houston, MD;  [facility];  Service: Endoscopy;  Laterality: N/A;  . ESOPHAGOGASTRODUODENOSCOPY N/A 07/17/2017   Procedure: ESOPHAGOGASTRODUODENOSCOPY (EGD);  Surgeon: Rogene Houston, MD;  [facility];  Service: Endoscopy;  Laterality: N/A;  1:00  . ESOPHAGOGASTRODUODENOSCOPY N/A 10/16/2017   Procedure: ESOPHAGOGASTRODUODENOSCOPY (EGD)WITH DILATION;  Surgeon: Rogene Houston, MD;  [facility];  Service: Endoscopy;  Laterality: N/A;  1030  . ESOPHAGOGASTRODUODENOSCOPY (EGD) WITH PROPOFOL N/A 01/23/2017   Procedure: ESOPHAGOGASTRODUODENOSCOPY (EGD) WITH PROPOFOL;  Surgeon: Rogene Houston, MD;  [facility];  Service: Endoscopy;  Laterality: N/A;  duodenal striture dilation  . ESOPHAGOGASTRODUODENOSCOPY (EGD) WITH PROPOFOL N/A 06/14/2020   Procedure: ESOPHAGOGASTRODUODENOSCOPY (EGD) WITH PROPOFOL;  Surgeon: Rogene Houston, MD;  [facility];  Service: Endoscopy;  Laterality: N/A;  1055  . ESOPHAGOGASTRODUODENOSCOPY (EGD) WITH PROPOFOL N/A 10/04/2020   Procedure: ESOPHAGOGASTRODUODENOSCOPY (EGD) WITH PROPOFOL;  Surgeon: Rogene Houston, MD;  [facility];  Service: Endoscopy;  Laterality: N/A;  1:00  . LAPAROSCOPY N/A 12/19/2017   Procedure: LAPAROSCOPY, PYLOROPLASTY WITH Silvestre Gunner;  Surgeon: Johnathan Hausen, MD;  [facility];  Service: General;  Laterality: N/A;  . NASAL SEPTOPLASTY W/ TURBINOPLASTY Bilateral 04/30/2019   Procedure: BILATERAL NASAL SEPTOPLASTY WITH TURBINATE REDUCTION;  Surgeon: Leta Baptist, MD;  Location: [place];  Service: ENT;  Laterality: Bilateral;  . RADIOACTIVE SEED GUIDED EXCISIONAL BREAST BIOPSY Right 05/19/2015   Procedure: EXCISIONAL RIGHT BREAST MASS WITH RADIOACTIVE SEED LOCALIZATION;  Surgeon:  Fanny Skates, MD;  Location: [place];  Service: General;  Laterality: Right;  . REPAIR OF PERFORATED ULCER  1992   duodenal  . VAGINAL HYSTERECTOMY  11/13/2006    Family History: Family History  Problem Relation Age of Onset  . Hypertension Mother   . Clotting disorder Father   . Diabetes Father   . Hypertension Father   . Hypertension Sister   . Hypertension Brother   . Hypertension Maternal Aunt   . Hypertension Maternal Uncle   . Hypertension Paternal Aunt   . Hypertension Paternal Uncle     Social History: Social History   Tobacco Use  Smoking Status Current Every Day Smoker  . Packs/day: 1.00  . Years: 18.00  . Pack years: 18.00  . Types: Cigarettes  Smokeless Tobacco Never Used   Social History   Substance and Sexual Activity  Alcohol Use No   Social History   Substance and Sexual Activity  Drug Use No    Allergies: Allergies  Allergen Reactions  . Codeine Nausea Only  . Hydrocodone Nausea Only    feels hot and insomia  . Sulfa Antibiotics Hives  . Penicillin V Nausea Only    Medications: Current Outpatient Medications  Medication Sig Dispense Refill  . cyclobenzaprine (FLEXERIL) 10 MG tablet Take 1 tablet (10 mg total) by mouth every 8 (eight) hours as needed for muscle  spasms. 30 tablet 1  . dicyclomine (BENTYL) 10 MG capsule Take 1 capsule (10 mg total) by mouth 3 (three) times daily before meals. 90 capsule 3  . estradiol (VIVELLE-DOT) 0.1 MG/24HR patch Place 1 patch (0.1 mg total) onto the skin 2 (two) times a week. 8 patch 12  . ketoconazole (NIZORAL) 2 % cream Apply 1 application topically daily. 30 g 1  . Multiple Vitamins-Minerals (CENTRUM ADULTS) TABS Take 1 tablet by mouth daily.     . pantoprazole (PROTONIX) 40 MG tablet Take 1 tablet (40 mg total) by mouth 2 (two) times daily before a meal. 60 tablet 5  . potassium chloride (MICRO-K) 10 MEQ CR capsule Take 10 mEq by mouth daily.    . sertraline (ZOLOFT) 50 MG tablet  Take 50 mg by mouth daily.    . sucralfate (CARAFATE) 1 g tablet Take po ac and Qhs (Patient taking differently: Take 1 g by mouth 3 (three) times daily before meals.) 120 tablet 3  . traMADol (ULTRAM) 50 MG tablet Take 50 mg by mouth every 6 (six) hours as needed for moderate pain.     . valACYclovir (VALTREX) 1000 MG tablet Take 1,000 mg by mouth daily.  5   No current facility-administered medications for this visit.    Review of Systems: GENERAL: negative for malaise, night sweats HEENT: No changes in hearing or vision, no nose bleeds or other nasal problems. NECK: Negative for lumps, goiter, pain and significant neck swelling RESPIRATORY: Negative for cough, wheezing CARDIOVASCULAR: Negative for chest pain, leg swelling, palpitations, orthopnea GI: SEE HPI MUSCULOSKELETAL: Negative for joint pain or swelling, back pain, and muscle pain. SKIN: Negative for lesions, rash PSYCH: Negative for sleep disturbance, mood disorder and recent psychosocial stressors. HEMATOLOGY Negative for prolonged bleeding, bruising easily, and swollen nodes. ENDOCRINE: Negative for cold or heat intolerance, polyuria, polydipsia and goiter. NEURO: negative for tremor, gait imbalance, syncope and seizures. The remainder of the review of systems is noncontributory.   Physical Exam: BP 124/80 (BP Location: Left Arm, Patient Position: Sitting, Cuff Size: Normal)   Pulse 97   Temp 97.8 F (36.6 C) (Oral)   Ht 5\' 4"  (1.626 m)   Wt 124 lb (56.2 kg)   BMI 21.28 kg/m  GENERAL: The patient is AO x3, in no acute distress. HEENT: Head is normocephalic and atraumatic. EOMI are intact. Mouth is well hydrated and without lesions. NECK: Supple. No masses LUNGS: Clear to auscultation. No presence of rhonchi/wheezing/rales. Adequate chest expansion HEART: RRR, normal s1 and s2. ABDOMEN: tender to palpation diffusely but worse in the upper abdominal area, no guarding, no peritoneal signs, and nondistended. BS +. No  masses. EXTREMITIES: Without any cyanosis, clubbing, rash, lesions or edema. NEUROLOGIC: AOx3, no focal motor deficit. SKIN: no jaundice, no rashes  Imaging/Labs: as above  I personally reviewed and interpreted the available labs, imaging and endoscopic files.  Impression and Plan: LAGINA READER is a 55 y.o. female with past medical history of fibromyalgia, depression, anxiety, history of duodenal ulcers complicated by perforation requiring a Graham patch placement and subsequent pyloroplasty, hypertension and IBS, who presents for follow up of recurrent episodes of pain in the chest and abdomen. Patient has had chronic intermittent symptoms of unexplained chest abdominal pain. I do not suspect these episodes are related to her history of duodenal ulcers and gastric outlet obstruction as she has not presented any persistent vomiting and these to explain the pain she is having in her chest. Given the uncertainty regarding  her episodes of pain, we will explore this further with a CT of the abdomen and pelvis.However, we will explore any recurrent stricture with a repeat EGD.Marland Kitchen She will benefit from trying another antispasmodic such as Levsin but if her symptoms persist without an identifiable cause during esophagogastroduodenospy or imaging, a TCA may be warranted.  I advised the patient to stop smoking at this may also lead to recurrent strictures and ulcers in the future. Finally she is due for colorectal cancer screening, I will order a colonoscopy at this time..  - Schedule EGD and colonoscopy - Schedule CT abdomen/pelvis with IV contrast - Patient was counseled about the benefit of implementing a low FODMAP to improve symptoms and recurrent episodes. A dietary list was provided to the patient.  -Start Levsin 1 tablet q8h as needed for abdominal pain -Stop dicyclomine  All questions were answered.      Harvel Quale, MD Gastroenterology and Hepatology [facility]

## 2020-12-01 ENCOUNTER — Other Ambulatory Visit (HOSPITAL_COMMUNITY)
Admission: RE | Admit: 2020-12-01 | Discharge: 2020-12-01 | Disposition: A | Discharge status: Home / Self Care | Payer: Medicaid Other | Source: Ambulatory Visit | Attending: Gastroenterology | Admitting: Gastroenterology

## 2020-12-01 NOTE — Progress Notes (Signed)
Called patient, left a message, she called me back. She missed her Covid, patient said no one told her she had a Covid test today. Apologized, asked patient to call her Dr's office Monday to reschedule her procedure. Patient understood.

## 2020-12-04 ENCOUNTER — Encounter (HOSPITAL_COMMUNITY): Payer: Self-pay | Admitting: Anesthesiology

## 2020-12-05 ENCOUNTER — Encounter (HOSPITAL_COMMUNITY): Admission: RE | Payer: Self-pay | Source: Ambulatory Visit

## 2020-12-05 ENCOUNTER — Ambulatory Visit (HOSPITAL_COMMUNITY): Admission: RE | Admit: 2020-12-05 | Payer: Medicaid Other | Source: Ambulatory Visit | Admitting: Gastroenterology

## 2020-12-05 ENCOUNTER — Encounter (INDEPENDENT_AMBULATORY_CARE_PROVIDER_SITE_OTHER): Payer: Self-pay

## 2020-12-05 SURGERY — COLONOSCOPY WITH PROPOFOL
Anesthesia: Monitor Anesthesia Care

## 2020-12-07 ENCOUNTER — Other Ambulatory Visit (INDEPENDENT_AMBULATORY_CARE_PROVIDER_SITE_OTHER): Payer: Self-pay | Admitting: Internal Medicine

## 2020-12-07 ENCOUNTER — Encounter (INDEPENDENT_AMBULATORY_CARE_PROVIDER_SITE_OTHER): Payer: Self-pay | Admitting: *Deleted

## 2020-12-18 ENCOUNTER — Other Ambulatory Visit: Payer: Medicaid Other

## 2020-12-18 ENCOUNTER — Ambulatory Visit: Payer: Medicaid Other | Admitting: Obstetrics & Gynecology

## 2020-12-21 ENCOUNTER — Ambulatory Visit (HOSPITAL_COMMUNITY): Payer: Medicaid Other

## 2021-01-16 ENCOUNTER — Ambulatory Visit (INDEPENDENT_AMBULATORY_CARE_PROVIDER_SITE_OTHER): Payer: Medicaid Other | Admitting: Internal Medicine

## 2021-01-30 ENCOUNTER — Ambulatory Visit (INDEPENDENT_AMBULATORY_CARE_PROVIDER_SITE_OTHER): Payer: Medicaid Other | Admitting: Gastroenterology

## 2021-02-06 ENCOUNTER — Other Ambulatory Visit: Payer: Self-pay | Admitting: Adult Health

## 2021-02-26 ENCOUNTER — Ambulatory Visit: Payer: Medicaid Other | Admitting: Adult Health

## 2021-03-08 ENCOUNTER — Ambulatory Visit (INDEPENDENT_AMBULATORY_CARE_PROVIDER_SITE_OTHER): Payer: Medicaid Other | Admitting: Gastroenterology

## 2021-05-01 ENCOUNTER — Ambulatory Visit: Payer: Medicaid Other | Admitting: Adult Health

## 2021-05-15 ENCOUNTER — Ambulatory Visit: Payer: Medicaid Other | Admitting: Adult Health

## 2021-08-21 ENCOUNTER — Telehealth: Payer: Self-pay | Admitting: Adult Health

## 2021-08-21 NOTE — Telephone Encounter (Signed)
Patient is having some white discharge, back pain and little nausea. She wanted to know if Anderson Malta could call her in something. I did schedule nurse visit if not.

## 2021-08-21 NOTE — Telephone Encounter (Signed)
Pt aware she will need to keep the nurse visit as scheduled. Pt voiced understanding. Kennard

## 2021-08-24 ENCOUNTER — Other Ambulatory Visit: Payer: Medicaid Other

## 2021-08-28 ENCOUNTER — Other Ambulatory Visit (INDEPENDENT_AMBULATORY_CARE_PROVIDER_SITE_OTHER): Payer: Self-pay | Admitting: Gastroenterology

## 2021-08-28 ENCOUNTER — Other Ambulatory Visit: Payer: Medicaid Other

## 2021-09-03 ENCOUNTER — Other Ambulatory Visit: Payer: Self-pay

## 2021-09-03 ENCOUNTER — Ambulatory Visit: Payer: Medicaid Other | Admitting: Adult Health

## 2021-09-03 ENCOUNTER — Encounter: Payer: Self-pay | Admitting: Adult Health

## 2021-09-03 VITALS — BP 137/88 | HR 112 | Ht 64.0 in | Wt 125.0 lb

## 2021-09-03 DIAGNOSIS — M545 Low back pain, unspecified: Secondary | ICD-10-CM | POA: Diagnosis not present

## 2021-09-03 DIAGNOSIS — R102 Pelvic and perineal pain: Secondary | ICD-10-CM | POA: Insufficient documentation

## 2021-09-03 DIAGNOSIS — R399 Unspecified symptoms and signs involving the genitourinary system: Secondary | ICD-10-CM | POA: Insufficient documentation

## 2021-09-03 LAB — POCT URINALYSIS DIPSTICK OB
Glucose, UA: NEGATIVE
Ketones, UA: NEGATIVE
Nitrite, UA: NEGATIVE
POC,PROTEIN,UA: NEGATIVE

## 2021-09-03 NOTE — Progress Notes (Signed)
  Subjective:     Patient ID: [name], female   DOB: 11-28-1965, 55 y.o.   MRN: [id number]  HPI [name] is a 55 year old white female, with SO, sp hysterectomy in complaining of low back pain,pain with sex,vaginal discharge at times, tired at times and weight gain. She was sick from 11/22 to 08/19/21, had sneezing, nausea and chills was negative for COVID and the flu. PCP is Dr Woody Seller.   Review of Systems +low back pain, not fibro pain Vaginal discharge at times +tired a times Pain with sex at times +weight gain Reviewed past medical,surgical, social and family history. Reviewed medications and allergies.     Objective:   Physical Exam BP 137/88 (BP Location: Left Arm, Patient Position: Sitting, Cuff Size: Normal)   Pulse (!) 112   Ht 5\' 4"  (1.626 m)   Wt 125 lb (56.7 kg)   BMI 21.46 kg/m     Urine dipstick small blood and trace leuks. Skin warm and dry.Pelvic: external genitalia is normal in appearance no lesions, vagina: pale with loss of rugae,urethra has no lesions or masses noted, cervix and uterus are absent,adnexa: no masses,+ tenderness over ovary area noted. Bladder is non tender and no masses felt  Upstream - 09/03/21 1529       Pregnancy Intention Screening   Does the patient want to become pregnant in the next year? No    Does the patient's partner want to become pregnant in the next year? No    Would the patient like to discuss contraceptive options today? No      Contraception Wrap Up   Current Method Female Sterilization   hyst   End Method Female Sterilization   hyst   Contraception Counseling Provided No            Examination chaperoned by Marcelino Scot RN  Assessment:     1. Symptoms of urinary tract infection Will get UA C&S to rule out UTI - POC Urinalysis Dipstick OB - Urinalysis, Routine w reflex microscopic - Urine Culture  2. Pelvic pain Pelvic US scheduled for 09/06/21 at [facility] at 2:30 pm, will talk when results back  - US PELVIC  COMPLETE WITH TRANSVAGINAL; Future  3. Bilateral low back pain without sciatica, unspecified chronicity     Plan:     Follow up prn

## 2021-09-04 LAB — URINALYSIS, ROUTINE W REFLEX MICROSCOPIC
Bilirubin, UA: NEGATIVE
Glucose, UA: NEGATIVE
Ketones, UA: NEGATIVE
Leukocytes,UA: NEGATIVE
Nitrite, UA: NEGATIVE
Protein,UA: NEGATIVE
Specific Gravity, UA: 1.01 (ref 1.005–1.030)
Urobilinogen, Ur: 0.2 mg/dL (ref 0.2–1.0)
pH, UA: 7 (ref 5.0–7.5)

## 2021-09-04 LAB — MICROSCOPIC EXAMINATION
Bacteria, UA: NONE SEEN
Casts: NONE SEEN /lpf
RBC, Urine: NONE SEEN /hpf (ref 0–2)
WBC, UA: NONE SEEN /hpf (ref 0–5)

## 2021-09-06 ENCOUNTER — Ambulatory Visit (HOSPITAL_COMMUNITY): Payer: Medicaid Other

## 2021-09-07 LAB — URINE CULTURE

## 2021-09-13 ENCOUNTER — Ambulatory Visit (HOSPITAL_COMMUNITY): Payer: Medicaid Other

## 2021-09-21 ENCOUNTER — Ambulatory Visit (HOSPITAL_COMMUNITY): Payer: Medicaid Other

## 2021-10-05 ENCOUNTER — Encounter (HOSPITAL_COMMUNITY): Payer: Self-pay

## 2021-10-05 ENCOUNTER — Ambulatory Visit (HOSPITAL_COMMUNITY): Payer: Medicaid Other

## 2021-10-15 ENCOUNTER — Telehealth: Payer: Self-pay | Admitting: Adult Health

## 2021-10-15 NOTE — Telephone Encounter (Signed)
Pelvic US @ [facility]-1/26 @ 12:15 to register. Go with a full bladder. Pt aware and voiced understanding. [place]

## 2021-10-15 NOTE — Telephone Encounter (Signed)
Pt states Central scheduling told her that the [place] order has expired. Can you put order in again and then I can schedule it? Thanks!! [place]

## 2021-10-15 NOTE — Telephone Encounter (Signed)
Patient went to schedule her [place] at [facility] and they said it had expired and to call the office. They said they had an opening this Thursday at 12:30 pm. Please advise.

## 2021-10-18 ENCOUNTER — Ambulatory Visit (HOSPITAL_COMMUNITY): Admission: RE | Admit: 2021-10-18 | Payer: Medicaid Other | Source: Ambulatory Visit

## 2021-10-22 ENCOUNTER — Ambulatory Visit (HOSPITAL_COMMUNITY)
Admission: RE | Admit: 2021-10-22 | Discharge: 2021-10-22 | Disposition: A | Discharge status: Home / Self Care | Payer: Medicaid Other | Source: Ambulatory Visit | Attending: Adult Health | Admitting: Adult Health

## 2021-10-22 ENCOUNTER — Other Ambulatory Visit: Payer: Self-pay

## 2021-10-22 DIAGNOSIS — R102 Pelvic and perineal pain: Secondary | ICD-10-CM | POA: Diagnosis present

## 2021-10-24 ENCOUNTER — Encounter: Payer: Self-pay | Admitting: Adult Health

## 2021-10-24 ENCOUNTER — Other Ambulatory Visit: Payer: Self-pay

## 2021-10-24 ENCOUNTER — Ambulatory Visit (INDEPENDENT_AMBULATORY_CARE_PROVIDER_SITE_OTHER): Payer: Medicaid Other | Admitting: Adult Health

## 2021-10-24 VITALS — BP 127/86 | HR 97 | Ht 64.0 in | Wt 126.0 lb

## 2021-10-24 DIAGNOSIS — R630 Anorexia: Secondary | ICD-10-CM

## 2021-10-24 DIAGNOSIS — N941 Unspecified dyspareunia: Secondary | ICD-10-CM | POA: Diagnosis not present

## 2021-10-24 DIAGNOSIS — R102 Pelvic and perineal pain: Secondary | ICD-10-CM

## 2021-10-24 DIAGNOSIS — R1011 Right upper quadrant pain: Secondary | ICD-10-CM

## 2021-10-24 NOTE — Progress Notes (Signed)
°  Subjective:     Patient ID: [name], female   DOB: 1965/11/29, 56 y.o.   MRN: [id number]  HPI [name] is s 56 year old white female, with S0, sp hysterectomy in complaining of pelvic pain and had [place] 10/22/21. Has some RUQ pain now that radiates to her neck PCP is Dr Woody Seller.  Review of Systems +pelvic pain +RUQ pain radiates to neck Decreased appetite  Denies any problems with urination or BMs Has had pain with sex    Reviewed past medical,surgical, social and family history. Reviewed medications and allergies.  Objective:   Physical Exam BP 127/86 (BP Location: Left Arm, Patient Position: Sitting, Cuff Size: Normal)    Pulse 97    Ht 5\' 4"  (1.626 m)    Wt 126 lb (57.2 kg)    BMI 21.63 kg/m     Skin warm and dry.  Lungs: clear to ausculation bilaterally. Cardiovascular: regular rate and rhythm. Abdomen is soft and has RUQ tenderness. US showed: Status post hysterectomy. Right ovary is not sonographically visualized. Pelvic sonogram is otherwise unremarkable. Assessment:     1. Pelvic pain Will get appt with Dr Elonda Husky to discuss options   2. Dyspareunia, female  3. RUQ pain Will get US abdomen to evaluate gallbladder at [facility] 11/01/21 at 7:30 am   4. Decreased appetite     Plan:      Return in 2 weeks to see Dr Elonda Husky to discuss pelvic pain

## 2021-10-29 ENCOUNTER — Encounter (INDEPENDENT_AMBULATORY_CARE_PROVIDER_SITE_OTHER): Payer: Self-pay | Admitting: Gastroenterology

## 2021-10-29 NOTE — Telephone Encounter (Signed)
Pt states sharp pain in RUQ after eating, has had some nausea. Started about 2 weeks ago. Last seen 11/23/20. Advised pt she needs OV and we was working on getting her worked and if worse before she heard back from [place] to call her pcp.

## 2021-10-29 NOTE — Telephone Encounter (Signed)
See other message from today 10/29/21

## 2021-10-29 NOTE — Telephone Encounter (Signed)
Left message to return call on [phone] to get more information

## 2021-10-30 ENCOUNTER — Encounter (INDEPENDENT_AMBULATORY_CARE_PROVIDER_SITE_OTHER): Payer: Self-pay | Admitting: Internal Medicine

## 2021-10-30 ENCOUNTER — Other Ambulatory Visit: Payer: Self-pay

## 2021-10-30 ENCOUNTER — Ambulatory Visit (INDEPENDENT_AMBULATORY_CARE_PROVIDER_SITE_OTHER): Payer: Medicaid Other | Admitting: Internal Medicine

## 2021-10-30 VITALS — BP 108/75 | HR 90 | Temp 98.6°F | Ht 64.0 in | Wt 125.3 lb

## 2021-10-30 DIAGNOSIS — K269 Duodenal ulcer, unspecified as acute or chronic, without hemorrhage or perforation: Secondary | ICD-10-CM

## 2021-10-30 DIAGNOSIS — R1011 Right upper quadrant pain: Secondary | ICD-10-CM | POA: Diagnosis not present

## 2021-10-30 NOTE — Patient Instructions (Signed)
Patient to go ER for evaluation

## 2021-10-30 NOTE — Progress Notes (Signed)
Presenting complaint;  Right upper quadrant abdominal pain  Database and subjective:  Patient is 56 year old Caucasian female who is here for scheduled visit.  She has complicated history of peptic ulcer disease.  She is status post surgery for perforated peptic ulcer in 1990 by Dr. Irving Shows and more recently in March 2019 by Dr. Johnathan Hausen.  On neurosurgery she also had enterolysis with open kocherization of duodenum and pyloroplasty with omental patch. Patient has been followed in our office since 2018.  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  She underwent a EGD in January 2022 when she was noted to have a gastric ulcer as well as duodenal ulcers.  A follow-up study was planned in March 2022.  She called to cancel the procedure and was never scheduled. Patient has a history of NSAID use.  Her gastrin levels in the past have been negative and she also has tested negative for H. pylori infection.  Patient says she was doing fine until 1 morning 3 weeks ago when on waking up she noted pain along the right side of her neck shoulder and she also had pain of the right rib cage radiating inferiorly.  She has some nausea but she did not experience vomiting melena or rectal bleeding.  She was seen at primary care physician's office and thought to have gynecological problem.  She was seen by Ms. Derrek Monaco, NP last week and evaluation was negative.  Pelvic and transvaginal ultrasound was unremarkable.  Ms. Laurann Montana felt that she had gallbladder disease and therefore schedule her for abdominal ultrasound which is not until 2 days from now.  Patient describes her pain to be constant 10 out of 10.  Not any it radiates into her shoulder or neck but also into her back.  Her appetite has been poor.  She has has been bloated.  Her bowels been moving 3 times a week.  She denies melena or rectal bleeding.  At times  she has felt hot and diaphoretic but she has not had any rigors.  She says PPI is not controlling heartburn anymore.  She tells me that she does not take NSAIDs in any form or shape.  She says she has gained weight last year while she was on hormone patch which she stopped 3 weeks ago.  Her weight today is 125 pounds.  She made 126 pounds in March 2022.  Current Medications: Outpatient Encounter Medications as of 10/30/2021  Medication Sig   cyclobenzaprine (FLEXERIL) 10 MG tablet Take 1 tablet (10 mg total) by mouth every 8 (eight) hours as needed for muscle spasms.   fluticasone (FLONASE) 50 MCG/ACT nasal spray Place 2 sprays into both nostrils daily as needed for allergies.   hyoscyamine (LEVSIN SL) 0.125 MG SL tablet Place 1 tablet (0.125 mg total) under the tongue every 6 (six) hours as needed (abdominal pain).   ketoconazole (NIZORAL) 2 % cream Apply 1 application topically daily.   Multiple Vitamins-Minerals (ONE-A-DAY MENOPAUSE FORMULA PO) Take by mouth.   mupirocin ointment (BACTROBAN) 2 % SMARTSIG:Sparingly Topical 3 Times Daily   ondansetron (ZOFRAN) 4 MG tablet Take 4 mg by mouth 4 (four) times daily as needed.   pantoprazole (PROTONIX) 40 MG tablet TAKE ONE TABLET BY MOUTH TWICE DAILY BEFORE A MEAL.   potassium chloride (MICRO-K) 10 MEQ CR capsule Take 10 mEq by mouth daily.   propranolol (INDERAL) 10 MG tablet Take  10 mg by mouth daily.   traMADol (ULTRAM) 50 MG tablet Take 50 mg by mouth every 6 (six) hours as needed for moderate pain.    valACYclovir (VALTREX) 1000 MG tablet Take 1,000 mg by mouth daily.   [DISCONTINUED] fluorometholone (FML) 0.1 % ophthalmic suspension SMARTSIG:1 Drop(s) In Eye(s) 2-4 Times Daily   [DISCONTINUED] Multiple Vitamin (MULTIVITAMIN WITH MINERALS) TABS tablet Take 1 tablet by mouth daily. Centrum Women's Multivitamin   [DISCONTINUED] sertraline (ZOLOFT) 50 MG tablet Take 25 mg by mouth every evening.   [DISCONTINUED] sucralfate (CARAFATE) 1 GM/10ML  suspension Take 1 g by mouth 2 (two) times daily before a meal.   No facility-administered encounter medications on file as of 10/30/2021.     Objective: Blood pressure 108/75, pulse 90, temperature 98.6 F (37 C), temperature source Oral, height _0  (1.626 m), weight 125 lb 4.8 oz (56.8 kg). Patient is alert and appears to be somewhat uncomfortable.  She is trying to grab right upper quadrant of the right hand. Conjunctiva is pink. Sclera is nonicteric Oropharyngeal mucosa is normal. No neck masses or thyromegaly noted. Cardiac exam with regular rhythm normal S1 and S2. No murmur or gallop noted. Lungs are clear to auscultation. Abdomen she has small right subcostal and upper midline scars.  Bowel sounds are normal.  On palpation abdomen is soft.  She has moderate tenderness in epigastrium and right upper quadrant with guarding with no rebound.  No organomegaly or masses. No LE edema or clubbing noted.  Labs/studies Results:   CBC Latest Ref Rng & Units 04/03/2018 12/24/2017 12/22/2017  WBC 4.0 - 10.5 K/uL 14.5(H) 15.2(H) 14.3(H)  Hemoglobin 12.0 - 15.0 g/dL 13.8 10.9(L) 12.4  Hematocrit 36.0 - 46.0 % 42.1 33.9(L) 37.2  Platelets 150 - 400 K/uL 329 317 346    CMP Latest Ref Rng & Units 04/27/2019 04/03/2018 12/25/2017  Glucose 70 - 99 mg/dL 103(H) 94 116(H)  BUN 6 - 20 mg/dL _1 Creatinine 0.44 - 1.00 mg/dL 0.89 0.56 0.53  Sodium 135 - 145 mmol/L 141 140 139  Potassium 3.5 - 5.1 mmol/L 4.6 3.1(L) 4.1  Chloride 98 - 111 mmol/L 105 109 103  CO2 22 - 32 mmol/L _2 Calcium 8.9 - 10.3 mg/dL 9.2 7.9(L) 8.4(L)  Total Protein 6.5 - 8.1 g/dL - 6.4(L) 5.8(L)  Total Bilirubin 0.3 - 1.2 mg/dL - 0.3 0.5  Alkaline Phos 38 - 126 U/L - 48 134(H)  AST 15 - 41 U/L - 16 18  ALT 0 - 44 U/L - 9 31    Hepatic Function Latest Ref Rng & Units 04/03/2018 12/25/2017 12/22/2017  Total Protein 6.5 - 8.1 g/dL 6.4(L) 5.8(L) 6.4(L)  Albumin 3.5 - 5.0 g/dL 3.5 2.1(L) 2.9(L)  AST 15 - 41 U/L _3 ALT  0 - 44 U/L _4 Alk Phosphatase 38 - 126 U/L 48 134(H) 57  Total Bilirubin 0.3 - 1.2 mg/dL 0.3 0.5 <0.1(L)      Assessment:  #1.  Right upper quadrant pain of 3 weeks duration associated with nausea and decrease in appetite.  Pain radiates into her back right side of neck and shoulder.  His symptom complex is suspicious for gallbladder disease.  However this could all be secondary to peptic ulcer disease.  She certainly could have contained perforation. Patient needs to be evaluated in emergency room.  Plan:  Patient advised to go to emergency room. Dr. Rogene Houston was contacted and provided with information about  the patient. Office visit date to be determined.

## 2021-10-31 ENCOUNTER — Telehealth: Payer: Self-pay | Admitting: Adult Health

## 2021-10-31 NOTE — Telephone Encounter (Signed)
Patient would like you to call her to discuss the next steps after her u/s you scheduled for in the morning and to discuss with you her new issues she is having now with her gallbladder since the last visit she had with you.

## 2021-10-31 NOTE — Telephone Encounter (Signed)
Pt has GB US in am, has had pain

## 2021-11-01 ENCOUNTER — Ambulatory Visit (HOSPITAL_COMMUNITY)
Admission: RE | Admit: 2021-11-01 | Discharge: 2021-11-01 | Disposition: A | Discharge status: Home / Self Care | Payer: Medicaid Other | Source: Ambulatory Visit | Attending: Adult Health | Admitting: Adult Health

## 2021-11-01 ENCOUNTER — Other Ambulatory Visit: Payer: Self-pay

## 2021-11-01 DIAGNOSIS — R1011 Right upper quadrant pain: Secondary | ICD-10-CM | POA: Insufficient documentation

## 2021-11-01 DIAGNOSIS — R630 Anorexia: Secondary | ICD-10-CM | POA: Insufficient documentation

## 2021-11-05 ENCOUNTER — Other Ambulatory Visit: Payer: Self-pay | Admitting: Adult Health

## 2021-11-05 DIAGNOSIS — R630 Anorexia: Secondary | ICD-10-CM

## 2021-11-05 DIAGNOSIS — R1011 Right upper quadrant pain: Secondary | ICD-10-CM

## 2021-11-06 ENCOUNTER — Ambulatory Visit (INDEPENDENT_AMBULATORY_CARE_PROVIDER_SITE_OTHER): Payer: Medicaid Other | Admitting: Obstetrics & Gynecology

## 2021-11-06 ENCOUNTER — Encounter: Payer: Self-pay | Admitting: Obstetrics & Gynecology

## 2021-11-06 DIAGNOSIS — R102 Pelvic and perineal pain: Secondary | ICD-10-CM | POA: Diagnosis not present

## 2021-11-06 DIAGNOSIS — N941 Unspecified dyspareunia: Secondary | ICD-10-CM

## 2021-11-06 NOTE — Progress Notes (Signed)
Follow up appointment for results:Telephone visit(patient states her phone is not able to do a [place] video visit)  Patient is at home I am in my office Total time: 20 minutes  Chief Complaint  Patient presents with   Follow-up    Was told by Anderson Malta to make appt to talk with Dr. Elonda Husky about pelvic pain.    Pt with complaint of RLQ and RUQ pain for some time, sharp sudden then dissipates Also discomfort with intercourse for about 6 months: insertion, 100%, uses lubrication, sime deeper discomfort  Scans below have been reviewed  Hx of perforated ulcer x 2 and multiple dilations  Hx of TVH, and subsequently laser of vagina for VAIN  There were no vitals taken for this visit.  Narrative & Impression  CLINICAL DATA:  Pelvic pain   EXAM: TRANSABDOMINAL AND TRANSVAGINAL ULTRASOUND OF PELVIS   DOPPLER ULTRASOUND OF OVARIES   TECHNIQUE: Both transabdominal and transvaginal ultrasound examinations of the pelvis were performed. Transabdominal technique was performed for global imaging of the pelvis including uterus, ovaries, adnexal regions, and pelvic cul-de-sac.   It was necessary to proceed with endovaginal exam following the transabdominal exam to visualize the ovaries. Color and duplex Doppler ultrasound was utilized to evaluate blood flow to the ovaries.   COMPARISON:  None.   FINDINGS: Uterus   Uterus is not seen consistent with hysterectomy.   Right ovary   Right ovary is not sonographically visualized and not evaluated.   Left ovary   Measurements: 0.9 x 1.2 x 2.1 = volume: 1.2 mL. Normal appearance/no adnexal mass.   Pulsed Doppler evaluation of left ovaries demonstrates normal low-resistance arterial and venous waveforms.   Other findings   No abnormal free fluid. There is wall thickening in the visualized bowel loops in the pelvic cavity.   IMPRESSION: Status post hysterectomy. Right ovary is not sonographically visualized. Pelvic sonogram is  otherwise unremarkable.     Electronically Signed   By: Elmer Picker M.D.   On: 10/23/2021 10:15    CLINICAL DATA:  Right upper quadrant pain for 1 year.   EXAM: ABDOMEN ULTRASOUND COMPLETE   COMPARISON:  CT abdomen and pelvis 04/03/2018   FINDINGS: Gallbladder: No gallstones or wall thickening visualized. No sonographic Murphy sign noted by sonographer.   Common bile duct: Diameter: 4.0 mm within normal limits   Liver: No focal lesion identified. Within normal limits in parenchymal echogenicity. Portal vein is patent on color Doppler imaging with normal direction of blood flow towards the liver.   IVC: No abnormality visualized.   Pancreas: Visualized portion unremarkable.   Spleen: Size and appearance within normal limits.   Right Kidney: Length: 8.8 cm. Echogenicity within normal limits. Incidental note of 1.7 x 1.7 x 1.6 cm benign simple cyst within the right renal midpole. No hydronephrosis visualized.   Left Kidney: Length: 9.8 cm. Echogenicity within normal limits. No mass or hydronephrosis visualized.   Abdominal aorta: No aneurysm visualized.   Other findings: None.   IMPRESSION:: IMPRESSION: 1. Normal appearance of the liver and gallbladder. 2. Incidental note of benign simple cyst within the right renal midpole measuring up to 1.7 cm. No hydronephrosis within either kidney.     Electronically Signed   By: Yvonne Kendall M.D.   On: 11/02/2021 13:34  MEDS ordered this encounter: No orders of the defined types were placed in this encounter.   Orders for this encounter: No orders of the defined types were placed in this encounter.   Impression:  ICD-10-CM   1. Dyspareunia, female  N94.10     2. Pelvic pain  R10.2        Plan: At this point it is difficult to associate the patient's pelvic pain process with a GYN source Could possibly be related to adhesive issues both from her previous perforated ulcers and due to a previous  vaginal hysterectomy The right ovary is so small it is not even visualized in the left ovary is quite small itself Definitely consistent with ovarian failure No evidence of any peritoneal inclusion cyst but there may be adhesions of the ovary to the pelvic sidewalls but again her pain description is disproportionate to this finding in such small ovaries Of course without an exam I cannot say for sure but her insertional dyspareunia is most likely to vaginal atrophy from menopause and she has a history of vaginal laser which could also be leading to vaginal shortening  She has a CT scan of the abdomen and pelvis scheduled for March 3 and I have recommended seeing the patient in the office after that scan for thorough pelvic exam specifically tried to delineate the above-noted features  The patient is in agreement with that and she will call the office back and make an appointment for after March 3  Follow Up: Return for after 11/23/21, Follow up, with Dr Elonda Husky.      All questions were answered.  Past Medical History:  Diagnosis Date   Anxiety    Depression    Essential hypertension, benign 12/26/2016   Fibroadenoma of right breast    S/P LUMPECTOMY W/ RADIOACTIVE SEED IMPLANT 05-19-2015   Fibromyalgia    GERD (gastroesophageal reflux disease)    History of cervical dysplasia    s/p conzation and recurrent s/p  vaginal hysterectomy 2008   History of duodenal ulcer    1992--  S/P  REPAIR PERFORATED ULCER   History of gastric ulcer    age 56   Hypertension    IBS (irritable bowel syndrome)    Vaginal Pap smear, abnormal    VIN II (vulvar intraepithelial neoplasia II)    Wears glasses    Wears partial dentures    lower    Past Surgical History:  Procedure Laterality Date   BLADDER SLING PROCEDURE  2005  approx.   CERVICAL CONIZATION W/BX  2006  approx   CO2 LASER APPLICATION N/A 3/66/4403   Procedure: CO2 LASER OF THE VAGINA;  Surgeon: Everitt Amber, MD;  Location: [facility];  Service: Gynecology;  Laterality: N/A;   ESOPHAGEAL DILATION N/A 06/14/2020   Procedure: ESOPHAGEAL DILATION;  Surgeon: Rogene Houston, MD;  [facility];  Service: Endoscopy;  Laterality: N/A;   ESOPHAGEAL DILATION N/A 10/04/2020   Procedure: ESOPHAGEAL DILATION;  Surgeon: Rogene Houston, MD;  Location: [facility];  Service: Endoscopy;  Laterality: N/A;   ESOPHAGOGASTRODUODENOSCOPY N/A 01/21/2017   Procedure: ESOPHAGOGASTRODUODENOSCOPY (EGD);  Surgeon: Rogene Houston, MD;  Location: [facility];  Service: Endoscopy;  Laterality: N/A;   ESOPHAGOGASTRODUODENOSCOPY N/A 07/17/2017   Procedure: ESOPHAGOGASTRODUODENOSCOPY (EGD);  Surgeon: Rogene Houston, MD;  Location: [facility];  Service: Endoscopy;  Laterality: N/A;  1:00   ESOPHAGOGASTRODUODENOSCOPY N/A 10/16/2017   Procedure: ESOPHAGOGASTRODUODENOSCOPY (EGD)WITH DILATION;  Surgeon: Rogene Houston, MD;  Location: [facility];  Service: Endoscopy;  Laterality: N/A;  1030   ESOPHAGOGASTRODUODENOSCOPY (EGD) WITH PROPOFOL N/A 01/23/2017   Procedure: ESOPHAGOGASTRODUODENOSCOPY (EGD) WITH PROPOFOL;  Surgeon: Rogene Houston, MD;  Location: [facility];  Service: Endoscopy;  Laterality: N/A;  duodenal striture dilation   ESOPHAGOGASTRODUODENOSCOPY (EGD) WITH PROPOFOL N/A 06/14/2020   Procedure: ESOPHAGOGASTRODUODENOSCOPY (EGD) WITH PROPOFOL;  Surgeon: Rogene Houston, MD;  [facility]: [facility];  Service: Endoscopy;  Laterality: N/A;  1055   ESOPHAGOGASTRODUODENOSCOPY (EGD) WITH PROPOFOL N/A 10/04/2020   Procedure: ESOPHAGOGASTRODUODENOSCOPY (EGD) WITH PROPOFOL;  Surgeon: Rogene Houston, MD;  Location: [facility];  Service: Endoscopy;  Laterality: N/A;  1:00   LAPAROSCOPY N/A 12/19/2017   Procedure: LAPAROSCOPY, PYLOROPLASTY WITH Silvestre Gunner;  Surgeon: Johnathan Hausen, MD;  Location: [facility];  Service: General;  Laterality: N/A;   NASAL SEPTOPLASTY W/ TURBINOPLASTY Bilateral 04/30/2019   Procedure: BILATERAL  NASAL SEPTOPLASTY WITH TURBINATE REDUCTION;  Surgeon: Leta Baptist, MD;  Location: [place];  Service: ENT;  Laterality: Bilateral;   RADIOACTIVE SEED GUIDED EXCISIONAL BREAST BIOPSY Right 05/19/2015   Procedure: EXCISIONAL RIGHT BREAST MASS WITH RADIOACTIVE SEED LOCALIZATION;  Surgeon: Fanny Skates, MD;  Location: [place];  Service: General;  Laterality: Right;   REPAIR OF PERFORATED ULCER  1992   duodenal   VAGINAL HYSTERECTOMY  11/13/2006    OB History     Gravida  2   Para  1   Term  1   Preterm      AB  1   Living  1      SAB  1   IAB      Ectopic      Multiple      Live Births  1           Allergies  Allergen Reactions   Codeine Nausea Only   Hydrocodone Nausea Only    feels hot and insomia   Sulfa Antibiotics Hives   Penicillin V Nausea Only    Social History   Socioeconomic History   Marital status: Significant Other    Spouse name: Not on file   Number of children: Not on file   Years of education: Not on file   Highest education level: Not on file  Occupational History   Not on file  Tobacco Use   Smoking status: Every Day    Packs/day: 1.00    Years: 18.00    Pack years: 18.00    Types: Cigarettes   Smokeless tobacco: Never  Vaping Use   Vaping Use: Never used  Substance and Sexual Activity   Alcohol use: No   Drug use: No   Sexual activity: Not Currently    Birth control/protection: Surgical    Comment: hyst  Other Topics Concern   Not on file  Social History Narrative   Not on file   Social Determinants of Health   Financial Resource Strain: Not on file  Food Insecurity: Not on file  Transportation Needs: Not on file  Physical Activity: Not on file  Stress: Not on file  Social Connections: Not on file    Family History  Problem Relation Age of Onset   Clotting disorder Father    Diabetes Father    Hypertension Father    Hypertension Mother    Hypertension Brother    Liver cancer  Brother    Hypertension Sister    Hypertension Maternal Aunt    Hypertension Maternal Uncle    Hypertension Paternal Aunt    Hypertension Paternal Interior and spatial designer

## 2021-11-12 ENCOUNTER — Encounter (INDEPENDENT_AMBULATORY_CARE_PROVIDER_SITE_OTHER): Payer: Self-pay | Admitting: Gastroenterology

## 2021-11-13 NOTE — Telephone Encounter (Signed)
Please ask Dr. Laural Golden about this as he was the provider that saw her most recently

## 2021-11-14 ENCOUNTER — Encounter: Payer: Self-pay | Admitting: Adult Health

## 2021-11-14 ENCOUNTER — Ambulatory Visit (INDEPENDENT_AMBULATORY_CARE_PROVIDER_SITE_OTHER): Payer: Medicaid Other | Admitting: Adult Health

## 2021-11-14 ENCOUNTER — Other Ambulatory Visit: Payer: Self-pay

## 2021-11-14 VITALS — BP 129/91 | HR 87 | Ht 64.0 in | Wt 125.0 lb

## 2021-11-14 DIAGNOSIS — R232 Flushing: Secondary | ICD-10-CM

## 2021-11-14 DIAGNOSIS — Z79899 Other long term (current) drug therapy: Secondary | ICD-10-CM

## 2021-11-14 MED ORDER — ESTRADIOL 0.75 MG/0.75GM TD GEL: TRANSDERMAL | 0 refills | Status: DC

## 2021-11-14 NOTE — Telephone Encounter (Signed)
Left message to return call 

## 2021-11-14 NOTE — Telephone Encounter (Signed)
Pt seen by dr Laural Golden on 2/7. Advised at visit to go to ED. Pt did not go to ED that day. She states she found out she had shingles on her chin soon after her visit  with dr Laural Golden. She is worried about menigitis because she has trouble turning neck. Taking valtrex and doxycycline.  She would like to see if dr Laural Golden would order ct abdomen and pelvis because she saw gyn and states Anderson Malta griffin told her she felt her abdominal pain was related to GI. Having lower abdominal pain, low grade fever for one day about one week ago. Has some nausea, symptoms started about 6 -7 weeks ago.

## 2021-11-14 NOTE — Progress Notes (Signed)
°  Subjective:     Patient ID: [name], female   DOB: Nov 27, 1965, 56 y.o.   MRN: [id number]  HPI [name] is a 56 year old white female, with S), sp hysterectomy in complaining of hot flashes, she is on Vivelle dot 0.1 mg patch from doctor in [place]. PCP is Dr Woody Seller  Review of Systems +hot flashes Reviewed past medical,surgical, social and family history. Reviewed medications and allergies.     Objective:   Physical Exam BP (!) 129/91 (BP Location: Left Arm, Patient Position: Sitting, Cuff Size: Normal)    Pulse 87    Ht 5\' 4"  (1.626 m)    Wt 125 lb (56.7 kg)    BMI 21.46 kg/m     Skin warm and dry.Lungs: clear to ausculation bilaterally. Cardiovascular: regular rate and rhythm.  Fall risk is low  Upstream - 11/14/21 1351       Pregnancy Intention Screening   Does the patient want to become pregnant in the next year? N/A    Does the patient's partner want to become pregnant in the next year? N/A    Would the patient like to discuss contraceptive options today? N/A      Contraception Wrap Up   Current Method Female Sterilization   hyst   End Method Female Sterilization   hyst   Contraception Counseling Provided No             Assessment:     1. Hot flashes Will stop patch and try divigel 1 packet daily on upper thigh, 3 boxes given to try  Meds ordered this encounter  Medications   Estradiol (DIVIGEL) 0.75 MG/0.75GM GEL    Sig: Apply 1 packet to upper thigh daily    Dispense:  21 each    Refill:  0    Order Specific Question:   Supervising Provider    Answer:   Elonda Husky, LUTHER H [[id number]]     2. Current use of estrogen therapy     Plan:     Follow up 11/29/21 with Dr Elonda Husky

## 2021-11-14 NOTE — Telephone Encounter (Signed)
Correction in note below. Pt states she is not sure if doxy is the name of the antibiotic. States she has been on antibiotic for about one and half weeks and has one week left.

## 2021-11-15 ENCOUNTER — Other Ambulatory Visit (INDEPENDENT_AMBULATORY_CARE_PROVIDER_SITE_OTHER): Payer: Self-pay | Admitting: *Deleted

## 2021-11-15 ENCOUNTER — Encounter (INDEPENDENT_AMBULATORY_CARE_PROVIDER_SITE_OTHER): Payer: Self-pay | Admitting: Gastroenterology

## 2021-11-15 DIAGNOSIS — R61 Generalized hyperhidrosis: Secondary | ICD-10-CM

## 2021-11-15 DIAGNOSIS — R109 Unspecified abdominal pain: Secondary | ICD-10-CM

## 2021-11-15 NOTE — Telephone Encounter (Signed)
Per dr Laural Golden order cbc. Order put in and pt was notified.

## 2021-11-15 NOTE — Telephone Encounter (Signed)
DR. Laural Golden reviewed messages and states may order cbc with diff and I called pt and let her know lab order is at front window to pick up and take over to lab. She verbalized understanding.   Pt sent in this my chart message below that I copied and pasted to previous message:   The antibiotics the urgent care gave me for infection is,cephalexin 500 mg.take for 7 days .I got them on 11/10/2021.but didn't do no blood work to see if infection.im taking them . Thank u    [name]  P [facility] (supporting Harvel Quale, MD) 10 hours ago (5:39 AM)   [facility] Apo can u see if he will send me for blood work to see if I got an infection in my blood? I'm hot and clamie on my face.something not right.im worried if there is infection it's got in my blood stream.stiff neck and getting clamie hot shoulder pain.i have been this way for weeks and nobody won't do blood work to see.i can't sleep because of it.im also hurting on the same side in my lower back.i want to make sure my white blood cells aren't low and see what's going on.

## 2021-11-16 LAB — CBC WITH DIFFERENTIAL/PLATELET
Absolute Monocytes: 599 cells/uL (ref 200–950)
Basophils Absolute: 52 cells/uL (ref 0–200)
Basophils Relative: 0.7 %
Eosinophils Absolute: 111 cells/uL (ref 15–500)
Eosinophils Relative: 1.5 %
HCT: 42.3 % (ref 35.0–45.0)
Hemoglobin: 14.3 g/dL (ref 11.7–15.5)
Lymphs Abs: 1635 cells/uL (ref 850–3900)
MCH: 33.3 pg — ABNORMAL HIGH (ref 27.0–33.0)
MCHC: 33.8 g/dL (ref 32.0–36.0)
MCV: 98.6 fL (ref 80.0–100.0)
MPV: 9 fL (ref 7.5–12.5)
Monocytes Relative: 8.1 %
Neutro Abs: 5002 cells/uL (ref 1500–7800)
Neutrophils Relative %: 67.6 %
Platelets: 394 10*3/uL (ref 140–400)
RBC: 4.29 10*6/uL (ref 3.80–5.10)
RDW: 14.2 % (ref 11.0–15.0)
Total Lymphocyte: 22.1 %
WBC: 7.4 10*3/uL (ref 3.8–10.8)

## 2021-11-16 NOTE — Telephone Encounter (Signed)
I called and spoke to patient and let her know If worse she should go to ED. She verbalized understanding.

## 2021-11-17 ENCOUNTER — Encounter (INDEPENDENT_AMBULATORY_CARE_PROVIDER_SITE_OTHER): Payer: Self-pay | Admitting: Gastroenterology

## 2021-11-19 NOTE — Telephone Encounter (Signed)
Called patient and let her know that she should go to ED right away. Dr. Laural Golden had recommended ER when she was last seen. Pt did not go to ED at that time as he recommended.

## 2021-11-19 NOTE — Telephone Encounter (Signed)
Left message to return call 

## 2021-11-20 ENCOUNTER — Telehealth (INDEPENDENT_AMBULATORY_CARE_PROVIDER_SITE_OTHER): Payer: Self-pay | Admitting: *Deleted

## 2021-11-20 ENCOUNTER — Telehealth (INDEPENDENT_AMBULATORY_CARE_PROVIDER_SITE_OTHER): Payer: Self-pay

## 2021-11-20 NOTE — Telephone Encounter (Signed)
Patient needs Egd per Dr. Laural Golden, Patient aware and expecting a call from our office to set up.

## 2021-11-20 NOTE — Telephone Encounter (Signed)
Patient wanted to be added to cancellation list with any of the providers.

## 2021-11-21 ENCOUNTER — Encounter (INDEPENDENT_AMBULATORY_CARE_PROVIDER_SITE_OTHER): Payer: Self-pay | Admitting: Internal Medicine

## 2021-11-21 ENCOUNTER — Other Ambulatory Visit: Payer: Self-pay | Admitting: Adult Health

## 2021-11-21 NOTE — Telephone Encounter (Signed)
Cancel [name]. She is being set up for EGD ?

## 2021-11-22 ENCOUNTER — Other Ambulatory Visit (INDEPENDENT_AMBULATORY_CARE_PROVIDER_SITE_OTHER): Payer: Self-pay

## 2021-11-22 ENCOUNTER — Encounter (INDEPENDENT_AMBULATORY_CARE_PROVIDER_SITE_OTHER): Payer: Self-pay

## 2021-11-22 DIAGNOSIS — R1011 Right upper quadrant pain: Secondary | ICD-10-CM

## 2021-11-22 DIAGNOSIS — K269 Duodenal ulcer, unspecified as acute or chronic, without hemorrhage or perforation: Secondary | ICD-10-CM

## 2021-11-25 ENCOUNTER — Encounter (INDEPENDENT_AMBULATORY_CARE_PROVIDER_SITE_OTHER): Payer: Self-pay | Admitting: Internal Medicine

## 2021-11-26 NOTE — Telephone Encounter (Signed)
This has been an ongoing thread between [name] the patient and Dr. Laural Golden the patient was told to go to the ed several times and the last suggestion from Dr. Laural Golden was to have a EGD Scheduled. It is scheduled 12/19/2021 Egd scheduled. Please advise. ?

## 2021-11-27 NOTE — Telephone Encounter (Signed)
I really do no know why you have had changes in your temperature. Have you discussed with with your PCP? Could be related to thyroid disease or hormonal alterations. Unlikely from the GI tract ?

## 2021-11-29 ENCOUNTER — Ambulatory Visit: Payer: Medicaid Other | Admitting: Obstetrics & Gynecology

## 2021-12-17 ENCOUNTER — Encounter: Payer: Self-pay | Admitting: Obstetrics & Gynecology

## 2021-12-18 ENCOUNTER — Encounter (INDEPENDENT_AMBULATORY_CARE_PROVIDER_SITE_OTHER): Payer: Self-pay | Admitting: Internal Medicine

## 2021-12-19 ENCOUNTER — Ambulatory Visit (HOSPITAL_COMMUNITY)
Admission: RE | Admit: 2021-12-19 | Discharge: 2021-12-19 | Disposition: A | Discharge status: Home / Self Care | Payer: Medicaid Other | Source: Ambulatory Visit | Attending: Gastroenterology | Admitting: Gastroenterology

## 2021-12-19 ENCOUNTER — Ambulatory Visit (HOSPITAL_BASED_OUTPATIENT_CLINIC_OR_DEPARTMENT_OTHER): Payer: Medicaid Other | Admitting: Anesthesiology

## 2021-12-19 ENCOUNTER — Encounter (HOSPITAL_COMMUNITY): Payer: Self-pay | Admitting: Gastroenterology

## 2021-12-19 ENCOUNTER — Encounter (INDEPENDENT_AMBULATORY_CARE_PROVIDER_SITE_OTHER): Payer: Self-pay

## 2021-12-19 ENCOUNTER — Telehealth (INDEPENDENT_AMBULATORY_CARE_PROVIDER_SITE_OTHER): Payer: Self-pay

## 2021-12-19 ENCOUNTER — Encounter (INDEPENDENT_AMBULATORY_CARE_PROVIDER_SITE_OTHER): Payer: Self-pay | Admitting: Internal Medicine

## 2021-12-19 ENCOUNTER — Other Ambulatory Visit: Payer: Self-pay

## 2021-12-19 ENCOUNTER — Ambulatory Visit (HOSPITAL_COMMUNITY): Payer: Medicaid Other | Admitting: Anesthesiology

## 2021-12-19 ENCOUNTER — Other Ambulatory Visit (INDEPENDENT_AMBULATORY_CARE_PROVIDER_SITE_OTHER): Payer: Self-pay

## 2021-12-19 ENCOUNTER — Encounter (HOSPITAL_COMMUNITY)
Admission: RE | Disposition: A | Discharge status: Home / Self Care | Payer: Self-pay | Source: Ambulatory Visit | Attending: Gastroenterology

## 2021-12-19 DIAGNOSIS — R6881 Early satiety: Secondary | ICD-10-CM

## 2021-12-19 DIAGNOSIS — R1011 Right upper quadrant pain: Secondary | ICD-10-CM

## 2021-12-19 DIAGNOSIS — K269 Duodenal ulcer, unspecified as acute or chronic, without hemorrhage or perforation: Secondary | ICD-10-CM

## 2021-12-19 DIAGNOSIS — K315 Obstruction of duodenum: Secondary | ICD-10-CM | POA: Diagnosis not present

## 2021-12-19 DIAGNOSIS — K589 Irritable bowel syndrome without diarrhea: Secondary | ICD-10-CM | POA: Diagnosis not present

## 2021-12-19 DIAGNOSIS — Z79899 Other long term (current) drug therapy: Secondary | ICD-10-CM | POA: Diagnosis not present

## 2021-12-19 DIAGNOSIS — F419 Anxiety disorder, unspecified: Secondary | ICD-10-CM | POA: Insufficient documentation

## 2021-12-19 DIAGNOSIS — R1310 Dysphagia, unspecified: Secondary | ICD-10-CM | POA: Diagnosis not present

## 2021-12-19 DIAGNOSIS — K219 Gastro-esophageal reflux disease without esophagitis: Secondary | ICD-10-CM | POA: Insufficient documentation

## 2021-12-19 DIAGNOSIS — K2289 Other specified disease of esophagus: Secondary | ICD-10-CM

## 2021-12-19 DIAGNOSIS — K449 Diaphragmatic hernia without obstruction or gangrene: Secondary | ICD-10-CM | POA: Diagnosis not present

## 2021-12-19 DIAGNOSIS — I10 Essential (primary) hypertension: Secondary | ICD-10-CM | POA: Diagnosis not present

## 2021-12-19 DIAGNOSIS — K279 Peptic ulcer, site unspecified, unspecified as acute or chronic, without hemorrhage or perforation: Secondary | ICD-10-CM

## 2021-12-19 DIAGNOSIS — F32A Depression, unspecified: Secondary | ICD-10-CM | POA: Diagnosis not present

## 2021-12-19 DIAGNOSIS — M797 Fibromyalgia: Secondary | ICD-10-CM | POA: Diagnosis not present

## 2021-12-19 DIAGNOSIS — F17210 Nicotine dependence, cigarettes, uncomplicated: Secondary | ICD-10-CM | POA: Diagnosis not present

## 2021-12-19 DIAGNOSIS — K259 Gastric ulcer, unspecified as acute or chronic, without hemorrhage or perforation: Secondary | ICD-10-CM | POA: Diagnosis not present

## 2021-12-19 DIAGNOSIS — K5710 Diverticulosis of small intestine without perforation or abscess without bleeding: Secondary | ICD-10-CM

## 2021-12-19 HISTORY — PX: BIOPSY: SHX5522

## 2021-12-19 HISTORY — PX: ESOPHAGOGASTRODUODENOSCOPY (EGD) WITH PROPOFOL: SHX5813

## 2021-12-19 SURGERY — ESOPHAGOGASTRODUODENOSCOPY (EGD) WITH PROPOFOL
Anesthesia: General

## 2021-12-19 MED ORDER — LIDOCAINE HCL (CARDIAC) PF 100 MG/5ML IV SOSY
PREFILLED_SYRINGE | INTRAVENOUS | Status: DC | PRN
  Administered 2021-12-19: 50 mg via INTRAVENOUS

## 2021-12-19 MED ORDER — LACTATED RINGERS IV SOLN: INTRAVENOUS | Status: DC

## 2021-12-19 MED ORDER — PROPOFOL 10 MG/ML IV BOLUS
INTRAVENOUS | Status: DC | PRN
  Administered 2021-12-19: 100 mg via INTRAVENOUS

## 2021-12-19 MED ORDER — PROPOFOL 500 MG/50ML IV EMUL
INTRAVENOUS | Status: DC | PRN
  Administered 2021-12-19: 150 ug/kg/min via INTRAVENOUS

## 2021-12-19 MED ORDER — OMEPRAZOLE-SODIUM BICARBONATE 40-1680 MG PO PACK
1.0000 | PACK | Freq: Two times a day (BID) | ORAL | 3 refills | Status: DC
Start: 2021-12-19 — End: 2022-06-04

## 2021-12-19 NOTE — Op Note (Addendum)
[facility] ?Patient Name: [name] ?Procedure Date: 12/19/2021 7:49 AM ?MRN: [id number] ?Date of Birth: [birth year] ?Attending MD: Maylon Peppers ,  ?CSN: [id number] ?Age: 56 ?Admit Type: Outpatient ?Procedure:                Upper GI endoscopy ?Indications:              Dysphagia, Follow-up of peptic ulcer, Early satiety ?Providers:                Maylon Peppers, Caprice Kluver ?Referring MD:              ?Medicines:                Monitored Anesthesia Care ?Complications:            No immediate complications. ?Estimated Blood Loss:     Estimated blood loss: none. ?Procedure:                Pre-Anesthesia Assessment: ?                          - Prior to the procedure, a History and Physical  ?                          was performed, and patient medications, allergies  ?                          and sensitivities were reviewed. The patient's  ?                          tolerance of previous anesthesia was reviewed. ?                          - The risks and benefits of the procedure and the  ?                          sedation options and risks were discussed with the  ?                          patient. All questions were answered and informed  ?                          consent was obtained. ?                          - ASA Grade Assessment: III - A patient with severe  ?                          systemic disease. ?                          After obtaining informed consent, the endoscope was  ?                          passed under direct vision. Throughout the  ?                          procedure, the patient's blood  pressure, pulse, and  ?                          oxygen saturations were monitored continuously. The  ?                          GIF-H190 ([id number]) scope was introduced through the  ?                          mouth, and advanced to the second part of duodenum.  ?                          The upper GI endoscopy was performed with  ?                          difficulty due to presence of  food. The patient  ?                          tolerated the procedure well. ?Scope In: 8:03:43 AM ?Scope Out: 8:09:57 AM ?Total Procedure Duration: 0 hours 6 minutes 14 seconds  ?Findings: ?     Patchy mucosal changes characterized by sloughing vs debris were found  ?     in the entire esophagus. Biopsies were obtained from the proximal and  ?     distal esophagus with cold forceps for eosinophilic esophagitis. No  ?     dilation was performed given presence of solid food in stomach. ?     A 2 cm hiatal hernia was present. ?     One non-bleeding cratered gastric ulcer with a clean ulcer base (Forrest  ?     Class III) was found in the gastric antrum. The lesion was 10 mm in  ?     largest dimension. ?     A medium amount of food (residue) was found in the entire examined  ?     stomach. ?     A small non-bleeding diverticulum was found in the first portion of the  ?     duodenum. ?     An acquired benign-appearing, intrinsic moderate stenosis was found in  ?     the second portion of the duodenum and was traversed after applying  ?     gentle pressure. ?Impression:               - Mucosal changes in the esophagus. Biopsied. ?                          - 2 cm hiatal hernia. ?                          - Non-bleeding gastric ulcer with a clean ulcer  ?                          base (Forrest Class III). ?                          - A medium amount of food (residue) in the stomach. ?                          -  Non-bleeding duodenal diverticulum. ?                          - Acquired duodenal stenosis. ?Moderate Sedation: ?     Per Anesthesia Care ?Recommendation:           - Discharge patient to home (ambulatory). ?                          - Resume previous diet. ?                          - Await pathology results. ?                          - Repeat upper endoscopy at the next available  ?                          appointment for retreatment given presence of food  ?                          in stomach - will need to  be on liquid diet 3 days  ?                          prior to her procedure. ?                          Stop pantoprazole and start Zegerid twice a day. ?Procedure Code(s):        --- Professional --- ?                          [phone], Esophagogastroduodenoscopy, flexible,  ?                          transoral; with biopsy, single or multiple ?Diagnosis Code(s):        --- Professional --- ?                          K22.8, Other specified diseases of esophagus ?                          K44.9, Diaphragmatic hernia without obstruction or  ?                          gangrene ?                          K25.9, Gastric ulcer, unspecified as acute or  ?                          chronic, without hemorrhage or perforation ?                          K31.5, Obstruction of duodenum ?                          R13.10, Dysphagia, unspecified ?  K27.9, Peptic ulcer, site unspecified, unspecified  ?                          as acute or chronic, without hemorrhage or  ?                          perforation ?                          R68.81, Early satiety ?                          K57.10, Diverticulosis of small intestine without  ?                          perforation or abscess without bleeding ?CPT copyright 2019 [employer]. All rights reserved. ?The codes documented in this report are preliminary and upon coder review may  ?be revised to meet current compliance requirements. ?Maylon Peppers, MD ?Maylon Peppers,  ?12/19/2021 8:22:46 AM ?This report has been signed electronically. ?Number of Addenda: 0 ?

## 2021-12-19 NOTE — Anesthesia Postprocedure Evaluation (Signed)
Anesthesia Post Note ? ?Patient: [name] ? ?Procedure(s) Performed: ESOPHAGOGASTRODUODENOSCOPY (EGD) WITH PROPOFOL ?BIOPSY ? ?Patient location during evaluation: Phase II ?Anesthesia Type: General ?Level of consciousness: awake ?Pain management: pain level controlled ?Vital Signs Assessment: post-procedure vital signs reviewed and stable ?Respiratory status: spontaneous breathing and respiratory function stable ?Cardiovascular status: blood pressure returned to baseline and stable ?Postop Assessment: no headache and no apparent nausea or vomiting ?Anesthetic complications: no ?Comments: Late entry ? ? ?No notable events documented. ? ? ?Last Vitals:  ?Vitals:  ? 12/19/21 0707 12/19/21 0812  ?BP: (!) 143/80 107/67  ?Pulse:  87  ?Resp: 12 16  ?Temp: 36.6 ?C 36.4 ?C  ?SpO2: 100% 96%  ?  ?Last Pain:  ?Vitals:  ? 12/19/21 0812  ?TempSrc: Oral  ?PainSc: 0-No pain  ? ? ?  ?  ?  ?  ?  ?  ? ?Louann Sjogren ? ? ? ? ?

## 2021-12-19 NOTE — Telephone Encounter (Signed)
[name] is rescheduled for Egd on Friday 12/21/21 and she is aware to remain on clear liquid diet  ?

## 2021-12-19 NOTE — Anesthesia Procedure Notes (Signed)
Date/Time: 12/19/2021 8:06 AM ?Performed by: Orlie Dakin, CRNA ?Pre-anesthesia Checklist: Patient identified, Emergency Drugs available, Suction available and Patient being monitored ?Oxygen Delivery Method: Nasal cannula ?Induction Type: IV induction ?Placement Confirmation: positive ETCO2 ? ? ? ? ?

## 2021-12-19 NOTE — Telephone Encounter (Signed)
Thanks

## 2021-12-19 NOTE — Telephone Encounter (Signed)
Message sent to patient on her other mychart message letting her know to follow up with pcp for these issues.  ?

## 2021-12-19 NOTE — Transfer of Care (Signed)
Immediate Anesthesia Transfer of Care Note ? ?Patient: [name] ? ?Procedure(s) Performed: ESOPHAGOGASTRODUODENOSCOPY (EGD) WITH PROPOFOL ?BIOPSY ? ?Patient Location: Endoscopy Unit ? ?Anesthesia Type:General ? ?Level of Consciousness: drowsy ? ?Airway & Oxygen Therapy: Patient Spontanous Breathing ? ?Post-op Assessment: Report given to RN and Post -op Vital signs reviewed and stable ? ?Post vital signs: Reviewed and stable ? ?Last Vitals:  ?Vitals Value Taken Time  ?BP    ?Temp    ?Pulse    ?Resp    ?SpO2    ? ? ?Last Pain:  ?Vitals:  ? 12/19/21 0800  ?PainSc: 5   ?   ? ?Patients Stated Pain Goal: 7 (12/19/21 9290) ? ?Complications: No notable events documented. ?

## 2021-12-19 NOTE — Telephone Encounter (Signed)
Yes, I saw her today for her EGD but this was not mentioned. Should follow with her PCP ?

## 2021-12-19 NOTE — Telephone Encounter (Signed)
Pt having EGD on Friday and wanted you to be aware.  ?

## 2021-12-19 NOTE — Anesthesia Preprocedure Evaluation (Signed)
Anesthesia Evaluation  ?Patient identified by MRN, date of birth, ID band ?Patient awake ? ? ? ?Reviewed: ?Allergy & Precautions, H&P , NPO status , Patient's Chart, lab work & pertinent test results, reviewed documented beta blocker date and time  ? ?Airway ?Mallampati: II ? ?TM Distance: >3 FB ?Neck ROM: full ? ? ? Dental ?no notable dental hx. ? ?  ?Pulmonary ?neg pulmonary ROS, Current Smoker,  ?  ?Pulmonary exam normal ?breath sounds clear to auscultation ? ? ? ? ? ? Cardiovascular ?Exercise Tolerance: Good ?hypertension, negative cardio ROS ? ? ?Rhythm:regular Rate:Normal ? ? ?  ?Neuro/Psych ?PSYCHIATRIC DISORDERS Anxiety Depression  Neuromuscular disease   ? GI/Hepatic ?Neg liver ROS, PUD, GERD  Medicated,  ?Endo/Other  ?negative endocrine ROS ? Renal/GU ?negative Renal ROS  ?negative genitourinary ?  ?Musculoskeletal ? ? Abdominal ?  ?Peds ? Hematology ?negative hematology ROS ?(+)   ?Anesthesia Other Findings ? ? Reproductive/Obstetrics ?negative OB ROS ? ?  ? ? ? ? ? ? ? ? ? ? ? ? ? ?  ?  ? ? ? ? ? ? ? ? ?Anesthesia Physical ?Anesthesia Plan ? ?ASA: 2 ? ?Anesthesia Plan: General  ? ?Post-op Pain Management:   ? ?Induction:  ? ?PONV Risk Score and Plan: Propofol infusion ? ?Airway Management Planned:  ? ?Additional Equipment:  ? ?Intra-op Plan:  ? ?Post-operative Plan:  ? ?Informed Consent: I have reviewed the patients History and Physical, chart, labs and discussed the procedure including the risks, benefits and alternatives for the proposed anesthesia with the patient or authorized representative who has indicated his/her understanding and acceptance.  ? ? ? ?Dental Advisory Given ? ?Plan Discussed with: CRNA ? ?Anesthesia Plan Comments:   ? ? ? ? ? ? ?Anesthesia Quick Evaluation ? ?

## 2021-12-19 NOTE — Telephone Encounter (Signed)
Thanks for the update, but agree, she needs to follow with her PCP for this ?

## 2021-12-19 NOTE — Discharge Instructions (Signed)
You are being discharged to home.  ?Resume your previous diet.  ?We are waiting for your pathology results.  ?Your physician has recommended a repeat upper endoscopy at the next available appointment for retreatment.  ?Stop pantoprazole and start Zegerid twice a day. ?

## 2021-12-19 NOTE — Telephone Encounter (Signed)
Having EGD on friday ?

## 2021-12-19 NOTE — Telephone Encounter (Signed)
I called and spoke with patient. She states lymph nodes under right arm started swelling last Thursday ( 12/13/21) and she saw PA taylor at pcp office on Monday (12/16/21) she had bw done yesterday and states she was going to her a mammogram. She does not have an appt yet and she thinks she did mention scheduling her a CT but she is not sure. I asked patient to call her pcp office back to see if her bw results are back to to clarify if they are going to schedule her a mammo and CT scan and then to let me know.  ?

## 2021-12-19 NOTE — H&P (Signed)
[name] is an 56 y.o. female.   ?Chief Complaint: Dysphagia, abdominal pain, abdominal distention and fullness ?HPI: 56 year old Caucasian female with complicated history of peptic ulcer disease status post surgery for perforated peptic ulcer x2,, had to undergo enterolysis with open kocherization of duodenum and pyloroplasty with omental patch (disease related to NSAID use), who comes for evaluation of dysphagia, abdominal pain, abdominal distention and abdominal fullness. ? ?Patient reports that she has presented recurrent episodes of abdominal pain and worsening dysphagia for the last 2 weeks.  Also feels full easily after eating a meal. ? ?Past Medical History:  ?Diagnosis Date  ? Anxiety   ? Depression   ? Essential hypertension, benign 12/26/2016  ? Fibroadenoma of right breast   ? S/P LUMPECTOMY W/ RADIOACTIVE SEED IMPLANT 05-19-2015  ? Fibromyalgia   ? GERD (gastroesophageal reflux disease)   ? History of cervical dysplasia   ? s/p conzation and recurrent s/p  vaginal hysterectomy 2008  ? History of duodenal ulcer   ? 1992--  S/P  REPAIR PERFORATED ULCER  ? History of gastric ulcer   ? age 11  ? Hypertension   ? IBS (irritable bowel syndrome)   ? Vaginal Pap smear, abnormal   ? VIN II (vulvar intraepithelial neoplasia II)   ? Wears glasses   ? Wears partial dentures   ? lower  ? ? ?Past Surgical History:  ?Procedure Laterality Date  ? BLADDER SLING PROCEDURE  2005  approx.  ? CERVICAL CONIZATION W/BX  2006  approx  ? CO2 LASER APPLICATION N/A 6/44/0347  ? Procedure: CO2 LASER OF THE VAGINA;  Surgeon: Everitt Amber, MD;  Location[facility];  Service: Gynecology;  Laterality: N/A;  ? ESOPHAGEAL DILATION N/A 06/14/2020  ? Procedure: ESOPHAGEAL DILATION;  Surgeon: Rogene Houston, MD;  Location: [facility];  Service: Endoscopy;  Laterality: N/A;  ? ESOPHAGEAL DILATION N/A 10/04/2020  ? Procedure: ESOPHAGEAL DILATION;  Surgeon: Rogene Houston, MD;  Location: [facility];  Service:  Endoscopy;  Laterality: N/A;  ? ESOPHAGOGASTRODUODENOSCOPY N/A 01/21/2017  ? Procedure: ESOPHAGOGASTRODUODENOSCOPY (EGD);  Surgeon: Rogene Houston, MD;  Location: [facility];  Service: Endoscopy;  Laterality: N/A;  ? ESOPHAGOGASTRODUODENOSCOPY N/A 07/17/2017  ? Procedure: ESOPHAGOGASTRODUODENOSCOPY (EGD);  Surgeon: Rogene Houston, MD;  [facility]: [facility];  Service: Endoscopy;  Laterality: N/A;  1:00  ? ESOPHAGOGASTRODUODENOSCOPY N/A 10/16/2017  ? Procedure: ESOPHAGOGASTRODUODENOSCOPY (EGD)WITH DILATION;  Surgeon: Rogene Houston, MD;  Location: [facility];  Service: Endoscopy;  Laterality: N/A;  1030  ? ESOPHAGOGASTRODUODENOSCOPY (EGD) WITH PROPOFOL N/A 01/23/2017  ? Procedure: ESOPHAGOGASTRODUODENOSCOPY (EGD) WITH PROPOFOL;  Surgeon: Rogene Houston, MD;  [facility]: [facility];  Service: Endoscopy;  Laterality: N/A;  duodenal striture dilation  ? ESOPHAGOGASTRODUODENOSCOPY (EGD) WITH PROPOFOL N/A 06/14/2020  ? Procedure: ESOPHAGOGASTRODUODENOSCOPY (EGD) WITH PROPOFOL;  Surgeon: Rogene Houston, MD;  [facility];  Service: Endoscopy;  Laterality: N/A;  1055  ? ESOPHAGOGASTRODUODENOSCOPY (EGD) WITH PROPOFOL N/A 10/04/2020  ? Procedure: ESOPHAGOGASTRODUODENOSCOPY (EGD) WITH PROPOFOL;  Surgeon: Rogene Houston, MD;  [facility]: [facility];  Service: Endoscopy;  Laterality: N/A;  1:00  ? LAPAROSCOPY N/A 12/19/2017  ? Procedure: LAPAROSCOPY, PYLOROPLASTY WITH Silvestre Gunner;  Surgeon: Johnathan Hausen, MD;  Location: [facility];  Service: General;  Laterality: N/A;  ? NASAL SEPTOPLASTY W/ TURBINOPLASTY Bilateral 04/30/2019  ? Procedure: BILATERAL NASAL SEPTOPLASTY WITH TURBINATE REDUCTION;  Surgeon: Leta Baptist, MD;  Location: [place];  Service: ENT;  Laterality: Bilateral;  ? RADIOACTIVE SEED GUIDED EXCISIONAL BREAST BIOPSY Right 05/19/2015  ? Procedure: EXCISIONAL RIGHT BREAST MASS WITH RADIOACTIVE SEED LOCALIZATION;  Surgeon: Fanny Skates, MD;  Location: [place];   Service: General;  Laterality: Right;  ? [place]  ? duodenal  ? VAGINAL HYSTERECTOMY  11/13/2006  ? ? ?Family History  ?Problem Relation Age of Onset  ? Clotting disorder Father   ? Diabetes Father   ? Hypertension Father   ? Hypertension Mother   ? Kidney failure Mother   ? Hypertension Brother   ? Liver cancer Brother   ? Other Brother   ?     cyst on kidney  ? Hypertension Sister   ? Hypertension Maternal Aunt   ? Hypertension Maternal Uncle   ? Hypertension Paternal Aunt   ? Hypertension Paternal Uncle   ? ?Social History:  reports that she has been smoking cigarettes. She has a 18.00 pack-year smoking history. She has never used smokeless tobacco. She reports that she does not drink alcohol and does not use drugs. ? ?Allergies:  ?Allergies  ?Allergen Reactions  ? Codeine Nausea Only  ? Hydrocodone Nausea Only  ?  feels hot and insomia  ? Sulfa Antibiotics Hives  ? Penicillin V Nausea Only  ? ? ?Medications Prior to Admission  ?Medication Sig Dispense Refill  ? alum & mag hydroxide-simeth (MAALOX/MYLANTA) 200-200-20 MG/5ML suspension Take 15 mLs by mouth every 6 (six) hours as needed for indigestion or heartburn.    ? estradiol (VIVELLE-DOT) 0.1 MG/24HR patch Place 1 patch onto the skin 2 (two) times a week.    ? fluticasone (FLONASE) 50 MCG/ACT nasal spray Place 2 sprays into both nostrils daily as needed for allergies.    ? pantoprazole (PROTONIX) 40 MG tablet TAKE ONE TABLET BY MOUTH TWICE DAILY BEFORE A MEAL. 60 tablet 5  ? potassium chloride (MICRO-K) 10 MEQ CR capsule Take 10 mEq by mouth daily.    ? traMADol (ULTRAM) 50 MG tablet Take 50 mg by mouth every 6 (six) hours as needed for moderate pain.     ? valACYclovir (VALTREX) 1000 MG tablet Take 1,000 mg by mouth daily.  5  ? Estradiol (DIVIGEL) 0.75 MG/0.75GM GEL Apply 1 packet to upper thigh daily (Patient not taking: Reported on 12/17/2021) 21 each 0  ? Multiple Vitamin (MULTIVITAMIN WITH MINERALS) TABS tablet Take 1 tablet by  mouth daily.    ? sertraline (ZOLOFT) 100 MG tablet Take 100 mg by mouth daily as needed (anxiety).    ? ? ?No results found for this or any previous visit (from the past 48 hour(s)). ?No results found. ? ?Review of Systems  ?Constitutional: Negative.   ?HENT:  Positive for trouble swallowing.   ?Eyes: Negative.   ?Respiratory: Negative.    ?Cardiovascular: Negative.   ?Gastrointestinal:  Positive for abdominal distention and abdominal pain.  ?Endocrine: Negative.   ?Genitourinary: Negative.   ?Musculoskeletal: Negative.   ?Skin: Negative.   ?Allergic/Immunologic: Negative.   ?Neurological: Negative.   ?Hematological: Negative.   ?Psychiatric/Behavioral: Negative.    ? ?Blood pressure (!) 143/80, temperature 97.8 ?F (36.6 ?C), resp. rate 12, height '5\' 4"'$  (1.626 m), weight 54.4 kg, SpO2 100 %. ?Physical Exam  ?GENERAL: The patient is AO x3, in no acute distress. ?HEENT: Head is normocephalic and atraumatic. EOMI are intact. Mouth is well hydrated and without lesions. ?NECK: Supple. No masses ?LUNGS: Clear to auscultation. No presence of rhonchi/wheezing/rales. Adequate chest expansion ?HEART:  RRR, normal s1 and s2. ?ABDOMEN: Soft, nontender, no guarding, no peritoneal signs, and nondistended. BS +. No masses. ?EXTREMITIES: Without any cyanosis, clubbing, rash, lesions or edema. ?NEUROLOGIC: AOx3, no focal motor deficit. ?SKIN: no jaundice, no rashes ? ?Assessment/Plan ?56 year old Caucasian female with complicated history of peptic ulcer disease status post surgery for perforated peptic ulcer x2,, had to undergo enterolysis with open kocherization of duodenum and pyloroplasty with omental patch (disease related to NSAID use), who comes for evaluation of dysphagia, abdominal pain, abdominal distention and abdominal fullness.  We will proceed with EGD with possible dilation. ? ?Harvel Quale, MD ?12/19/2021, 7:47 AM ? ? ? ?

## 2021-12-19 NOTE — H&P (View-Only) (Signed)
[name] is an 56 y.o. female.   ?Chief Complaint: Dysphagia, abdominal pain, abdominal distention and fullness ?HPI: 56 year old Caucasian female with complicated history of peptic ulcer disease status post surgery for perforated peptic ulcer x2,, had to undergo enterolysis with open kocherization of duodenum and pyloroplasty with omental patch (disease related to NSAID use), who comes for evaluation of dysphagia, abdominal pain, abdominal distention and abdominal fullness. ? ?Patient reports that she has presented recurrent episodes of abdominal pain and worsening dysphagia for the last 2 weeks.  Also feels full easily after eating a meal. ? ?Past Medical History:  ?Diagnosis Date  ? Anxiety   ? Depression   ? Essential hypertension, benign 12/26/2016  ? Fibroadenoma of right breast   ? S/P LUMPECTOMY W/ RADIOACTIVE SEED IMPLANT 05-19-2015  ? Fibromyalgia   ? GERD (gastroesophageal reflux disease)   ? History of cervical dysplasia   ? s/p conzation and recurrent s/p  vaginal hysterectomy 2008  ? History of duodenal ulcer   ? 1992--  S/P  REPAIR PERFORATED ULCER  ? History of gastric ulcer   ? age 54  ? Hypertension   ? IBS (irritable bowel syndrome)   ? Vaginal Pap smear, abnormal   ? VIN II (vulvar intraepithelial neoplasia II)   ? Wears glasses   ? Wears partial dentures   ? lower  ? ? ?Past Surgical History:  ?Procedure Laterality Date  ? BLADDER SLING PROCEDURE  2005  approx.  ? CERVICAL CONIZATION W/BX  2006  approx  ? CO2 LASER APPLICATION N/A 1/61/0960  ? Procedure: CO2 LASER OF THE VAGINA;  Surgeon: Everitt Amber, MD;  Location: [facility];  Service: Gynecology;  Laterality: N/A;  ? ESOPHAGEAL DILATION N/A 06/14/2020  ? Procedure: ESOPHAGEAL DILATION;  Surgeon: Rogene Houston, MD;  [facility];  Service: Endoscopy;  Laterality: N/A;  ? ESOPHAGEAL DILATION N/A 10/04/2020  ? Procedure: ESOPHAGEAL DILATION;  Surgeon: Rogene Houston, MD;  [facility];  Service:  Endoscopy;  Laterality: N/A;  ? ESOPHAGOGASTRODUODENOSCOPY N/A 01/21/2017  ? Procedure: ESOPHAGOGASTRODUODENOSCOPY (EGD);  Surgeon: Rogene Houston, MD;  [facility];  Service: Endoscopy;  Laterality: N/A;  ? ESOPHAGOGASTRODUODENOSCOPY N/A 07/17/2017  ? Procedure: ESOPHAGOGASTRODUODENOSCOPY (EGD);  Surgeon: Rogene Houston, MD;  [facility];  Service: Endoscopy;  Laterality: N/A;  1:00  ? ESOPHAGOGASTRODUODENOSCOPY N/A 10/16/2017  ? Procedure: ESOPHAGOGASTRODUODENOSCOPY (EGD)WITH DILATION;  Surgeon: Rogene Houston, MD;  [facility];  Service: Endoscopy;  Laterality: N/A;  1030  ? ESOPHAGOGASTRODUODENOSCOPY (EGD) WITH PROPOFOL N/A 01/23/2017  ? Procedure: ESOPHAGOGASTRODUODENOSCOPY (EGD) WITH PROPOFOL;  Surgeon: Rogene Houston, MD;  [facility];  Service: Endoscopy;  Laterality: N/A;  duodenal striture dilation  ? ESOPHAGOGASTRODUODENOSCOPY (EGD) WITH PROPOFOL N/A 06/14/2020  ? Procedure: ESOPHAGOGASTRODUODENOSCOPY (EGD) WITH PROPOFOL;  Surgeon: Rogene Houston, MD;  [facility];  Service: Endoscopy;  Laterality: N/A;  1055  ? ESOPHAGOGASTRODUODENOSCOPY (EGD) WITH PROPOFOL N/A 10/04/2020  ? Procedure: ESOPHAGOGASTRODUODENOSCOPY (EGD) WITH PROPOFOL;  Surgeon: Rogene Houston, MD;  [facility];  Service: Endoscopy;  Laterality: N/A;  1:00  ? LAPAROSCOPY N/A 12/19/2017  ? Procedure: LAPAROSCOPY, PYLOROPLASTY WITH Silvestre Gunner;  Surgeon: Johnathan Hausen, MD;  Location: [facility];  Service: General;  Laterality: N/A;  ? NASAL SEPTOPLASTY W/ TURBINOPLASTY Bilateral 04/30/2019  ? Procedure: BILATERAL NASAL SEPTOPLASTY WITH TURBINATE REDUCTION;  Surgeon: Leta Baptist, MD;  Location: [place];  Service: ENT;  Laterality: Bilateral;  ? RADIOACTIVE SEED GUIDED EXCISIONAL BREAST BIOPSY Right 05/19/2015  ? Procedure: EXCISIONAL RIGHT BREAST MASS WITH RADIOACTIVE SEED LOCALIZATION;  Surgeon: Fanny Skates, MD;  Location: [place];   Service: General;  Laterality: Right;  ? [place]  ? duodenal  ? VAGINAL HYSTERECTOMY  11/13/2006  ? ? ?Family History  ?Problem Relation Age of Onset  ? Clotting disorder Father   ? Diabetes Father   ? Hypertension Father   ? Hypertension Mother   ? Kidney failure Mother   ? Hypertension Brother   ? Liver cancer Brother   ? Other Brother   ?     cyst on kidney  ? Hypertension Sister   ? Hypertension Maternal Aunt   ? Hypertension Maternal Uncle   ? Hypertension Paternal Aunt   ? Hypertension Paternal Uncle   ? ?Social History:  reports that she has been smoking cigarettes. She has a 18.00 pack-year smoking history. She has never used smokeless tobacco. She reports that she does not drink alcohol and does not use drugs. ? ?Allergies:  ?Allergies  ?Allergen Reactions  ? Codeine Nausea Only  ? Hydrocodone Nausea Only  ?  feels hot and insomia  ? Sulfa Antibiotics Hives  ? Penicillin V Nausea Only  ? ? ?Medications Prior to Admission  ?Medication Sig Dispense Refill  ? alum & mag hydroxide-simeth (MAALOX/MYLANTA) 200-200-20 MG/5ML suspension Take 15 mLs by mouth every 6 (six) hours as needed for indigestion or heartburn.    ? estradiol (VIVELLE-DOT) 0.1 MG/24HR patch Place 1 patch onto the skin 2 (two) times a week.    ? fluticasone (FLONASE) 50 MCG/ACT nasal spray Place 2 sprays into both nostrils daily as needed for allergies.    ? pantoprazole (PROTONIX) 40 MG tablet TAKE ONE TABLET BY MOUTH TWICE DAILY BEFORE A MEAL. 60 tablet 5  ? potassium chloride (MICRO-K) 10 MEQ CR capsule Take 10 mEq by mouth daily.    ? traMADol (ULTRAM) 50 MG tablet Take 50 mg by mouth every 6 (six) hours as needed for moderate pain.     ? valACYclovir (VALTREX) 1000 MG tablet Take 1,000 mg by mouth daily.  5  ? Estradiol (DIVIGEL) 0.75 MG/0.75GM GEL Apply 1 packet to upper thigh daily (Patient not taking: Reported on 12/17/2021) 21 each 0  ? Multiple Vitamin (MULTIVITAMIN WITH MINERALS) TABS tablet Take 1 tablet by  mouth daily.    ? sertraline (ZOLOFT) 100 MG tablet Take 100 mg by mouth daily as needed (anxiety).    ? ? ?No results found for this or any previous visit (from the past 48 hour(s)). ?No results found. ? ?Review of Systems  ?Constitutional: Negative.   ?HENT:  Positive for trouble swallowing.   ?Eyes: Negative.   ?Respiratory: Negative.    ?Cardiovascular: Negative.   ?Gastrointestinal:  Positive for abdominal distention and abdominal pain.  ?Endocrine: Negative.   ?Genitourinary: Negative.   ?Musculoskeletal: Negative.   ?Skin: Negative.   ?Allergic/Immunologic: Negative.   ?Neurological: Negative.   ?Hematological: Negative.   ?Psychiatric/Behavioral: Negative.    ? ?Blood pressure (!) 143/80, temperature 97.8 ?F (36.6 ?C), resp. rate 12, height '5\' 4"'$  (1.626 m), weight 54.4 kg, SpO2 100 %. ?Physical Exam  ?GENERAL: The patient is AO x3, in no acute distress. ?HEENT: Head is normocephalic and atraumatic. EOMI are intact. Mouth is well hydrated and without lesions. ?NECK: Supple. No masses ?LUNGS: Clear to auscultation. No presence of rhonchi/wheezing/rales. Adequate chest expansion ?HEART:  RRR, normal s1 and s2. ?ABDOMEN: Soft, nontender, no guarding, no peritoneal signs, and nondistended. BS +. No masses. ?EXTREMITIES: Without any cyanosis, clubbing, rash, lesions or edema. ?NEUROLOGIC: AOx3, no focal motor deficit. ?SKIN: no jaundice, no rashes ? ?Assessment/Plan ?56 year old Caucasian female with complicated history of peptic ulcer disease status post surgery for perforated peptic ulcer x2,, had to undergo enterolysis with open kocherization of duodenum and pyloroplasty with omental patch (disease related to NSAID use), who comes for evaluation of dysphagia, abdominal pain, abdominal distention and abdominal fullness.  We will proceed with EGD with possible dilation. ? ?Harvel Quale, MD ?12/19/2021, 7:47 AM ? ? ? ?

## 2021-12-20 ENCOUNTER — Other Ambulatory Visit (INDEPENDENT_AMBULATORY_CARE_PROVIDER_SITE_OTHER): Payer: Self-pay

## 2021-12-20 ENCOUNTER — Telehealth: Payer: Self-pay | Admitting: Adult Health

## 2021-12-20 DIAGNOSIS — R1032 Left lower quadrant pain: Secondary | ICD-10-CM

## 2021-12-20 LAB — SURGICAL PATHOLOGY

## 2021-12-20 NOTE — Telephone Encounter (Signed)
Patient wants to go back on estradiol patch. She said the prescription request got denied. Please advise.  ?

## 2021-12-21 ENCOUNTER — Ambulatory Visit (HOSPITAL_COMMUNITY)
Admission: RE | Admit: 2021-12-21 | Discharge: 2021-12-21 | Disposition: A | Discharge status: Home / Self Care | Payer: Medicaid Other | Source: Ambulatory Visit | Attending: Gastroenterology | Admitting: Gastroenterology

## 2021-12-21 ENCOUNTER — Ambulatory Visit (HOSPITAL_COMMUNITY): Payer: Medicaid Other | Admitting: Anesthesiology

## 2021-12-21 ENCOUNTER — Other Ambulatory Visit: Payer: Self-pay

## 2021-12-21 ENCOUNTER — Telehealth (INDEPENDENT_AMBULATORY_CARE_PROVIDER_SITE_OTHER): Payer: Self-pay | Admitting: *Deleted

## 2021-12-21 ENCOUNTER — Encounter (HOSPITAL_COMMUNITY)
Admission: RE | Disposition: A | Discharge status: Home / Self Care | Payer: Self-pay | Source: Ambulatory Visit | Attending: Gastroenterology

## 2021-12-21 ENCOUNTER — Encounter (HOSPITAL_COMMUNITY): Payer: Self-pay | Admitting: Gastroenterology

## 2021-12-21 ENCOUNTER — Ambulatory Visit (HOSPITAL_BASED_OUTPATIENT_CLINIC_OR_DEPARTMENT_OTHER): Payer: Medicaid Other | Admitting: Anesthesiology

## 2021-12-21 DIAGNOSIS — K219 Gastro-esophageal reflux disease without esophagitis: Secondary | ICD-10-CM | POA: Diagnosis not present

## 2021-12-21 DIAGNOSIS — K315 Obstruction of duodenum: Secondary | ICD-10-CM | POA: Diagnosis present

## 2021-12-21 DIAGNOSIS — K449 Diaphragmatic hernia without obstruction or gangrene: Secondary | ICD-10-CM | POA: Diagnosis not present

## 2021-12-21 DIAGNOSIS — K257 Chronic gastric ulcer without hemorrhage or perforation: Secondary | ICD-10-CM | POA: Insufficient documentation

## 2021-12-21 DIAGNOSIS — K269 Duodenal ulcer, unspecified as acute or chronic, without hemorrhage or perforation: Secondary | ICD-10-CM | POA: Insufficient documentation

## 2021-12-21 DIAGNOSIS — K277 Chronic peptic ulcer, site unspecified, without hemorrhage or perforation: Secondary | ICD-10-CM | POA: Diagnosis not present

## 2021-12-21 DIAGNOSIS — K5710 Diverticulosis of small intestine without perforation or abscess without bleeding: Secondary | ICD-10-CM | POA: Diagnosis not present

## 2021-12-21 DIAGNOSIS — K259 Gastric ulcer, unspecified as acute or chronic, without hemorrhage or perforation: Secondary | ICD-10-CM

## 2021-12-21 DIAGNOSIS — F17210 Nicotine dependence, cigarettes, uncomplicated: Secondary | ICD-10-CM | POA: Diagnosis not present

## 2021-12-21 DIAGNOSIS — Z8711 Personal history of peptic ulcer disease: Secondary | ICD-10-CM | POA: Diagnosis not present

## 2021-12-21 HISTORY — PX: BALLOON DILATION: SHX5330

## 2021-12-21 HISTORY — PX: ESOPHAGOGASTRODUODENOSCOPY (EGD) WITH PROPOFOL: SHX5813

## 2021-12-21 SURGERY — ESOPHAGOGASTRODUODENOSCOPY (EGD) WITH PROPOFOL
Anesthesia: General

## 2021-12-21 MED ORDER — PROPOFOL 500 MG/50ML IV EMUL
INTRAVENOUS | Status: DC | PRN
  Administered 2021-12-21: 150 ug/kg/min via INTRAVENOUS

## 2021-12-21 MED ORDER — LIDOCAINE HCL (CARDIAC) PF 100 MG/5ML IV SOSY
PREFILLED_SYRINGE | INTRAVENOUS | Status: DC | PRN
  Administered 2021-12-21: 60 mg via INTRATRACHEAL

## 2021-12-21 MED ORDER — SUCRALFATE 1 GM/10ML PO SUSP: 1.0000 g | Freq: Four times a day (QID) | ORAL | 0 refills | Status: DC

## 2021-12-21 MED ORDER — LACTATED RINGERS IV SOLN: INTRAVENOUS | Status: DC | PRN

## 2021-12-21 MED ORDER — PROPOFOL 10 MG/ML IV BOLUS
INTRAVENOUS | Status: DC | PRN
  Administered 2021-12-21: 200 mg via INTRAVENOUS

## 2021-12-21 MED ORDER — LACTATED RINGERS IV SOLN: INTRAVENOUS | Status: DC

## 2021-12-21 NOTE — Telephone Encounter (Signed)
Call me Monday to discuss the patch  ?

## 2021-12-21 NOTE — Op Note (Signed)
[facility] ?Patient Name: [name] ?Procedure Date: 12/21/2021 2:27 PM ?MRN: [id number] ?Date of Birth: 05-09-1966 ?Attending MD: Maylon Peppers ,  ?CSN: [id number] ?Age: 56 ?Admit Type: Outpatient ?Procedure:                Upper GI endoscopy ?Indications:              For therapy of duodenal stenosis, Follow-up of  ?                          chronic gastric ulcer ?Providers:                Maylon Peppers, Shiner Page, [place] Cyndi Bender  ?                          Tech, Technician ?Referring MD:              ?Medicines:                Monitored Anesthesia Care ?Complications:            No immediate complications. ?Estimated Blood Loss:     Estimated blood loss: none. ?Procedure:                Pre-Anesthesia Assessment: ?                          - Prior to the procedure, a History and Physical  ?                          was performed, and patient medications, allergies  ?                          and sensitivities were reviewed. The patient's  ?                          tolerance of previous anesthesia was reviewed. ?                          - The risks and benefits of the procedure and the  ?                          sedation options and risks were discussed with the  ?                          patient. All questions were answered and informed  ?                          consent was obtained. ?                          - ASA Grade Assessment: III - A patient with severe  ?                          systemic disease. ?                          After obtaining informed consent, the endoscope was  ?  passed under direct vision. Throughout the  ?                          procedure, the patient's blood pressure, pulse, and  ?                          oxygen saturations were monitored continuously. The  ?                          GIF-H190 ([id number]) scope was introduced through the  ?                          mouth, and advanced to the second part of duodenum.  ?                           The upper GI endoscopy was accomplished without  ?                          difficulty. The patient tolerated the procedure  ?                          well. ?Scope In: 2:37:10 PM ?Scope Out: 2:50:41 PM ?Total Procedure Duration: 0 hours 13 minutes 31 seconds  ?Findings: ?     A 2 cm hiatal hernia was present. ?     The exam of the esophagus was otherwise normal. Abnormalities present in  ?     previous EGD had resolved. ?     One non-bleeding cratered gastric ulcer with a clean ulcer base (Forrest  ?     Class III) was found in the gastric antrum. The lesion was 10 mm in  ?     largest dimension. Biopsies from the edge were taken with a cold forceps  ?     for histology. Biopsies from body and antrum were taken with a cold  ?     forceps for Helicobacter pylori testing. ?     A small non-bleeding diverticulum was found in the duodenal bulb. ?     An acquired benign-appearing, intrinsic moderate stenosis was found  ?     between the first and second portion of the duodenum and was traversed  ?     with gentle pressure. A TTS dilator was passed through the scope.  ?     Dilation with a 07-04-11 mm balloon dilator was performed to 12 mm. Mild  ?     mucosal disruption was seen upon reinspection. ?     One non-bleeding cratered duodenal ulcer with a clean ulcer base  ?     (Forrest Class III) was found in the first portion of the duodenum, just  ?     proximal to the area of duodenal stricture. The lesion was 15 mm in  ?     largest dimension. ?     The exam of the duodenum was otherwise normal. ?Impression:               - 2 cm hiatal hernia. ?                          - Non-bleeding gastric ulcer with a clean  ulcer  ?                          base (Forrest Class III). Biopsied. ?                          - Non-bleeding duodenal diverticulum. ?                          - Acquired duodenal stenosis. Dilated. ?                          - Non-bleeding duodenal ulcer with a clean ulcer  ?                           base (Forrest Class III). ?Moderate Sedation: ?     Per Anesthesia Care ?Recommendation:           - Discharge patient to home (ambulatory). ?                          - Resume previous diet. ?                          - Continue present medications. ?                          - No ibuprofen, naproxen, or other non-steroidal  ?                          anti-inflammatory drugs. ?                          - Use sucralfate tablets 1 gram PO QID for 1 month. ?                          - Check H. pylori serology ?                          - Smoking cessation. ?                          - Repeat upper endoscopy in 8 weeks for retreatment  ?                          - will need to be on liquid diet 3 days before  ?                          procedure. ?Procedure Code(s):        --- Professional --- ?                          [phone], Esophagogastroduodenoscopy, flexible,  ?                          transoral; with dilation of gastric/duodenal  ?  stricture(s) (eg, balloon, bougie) ?                          43239, 59, Esophagogastroduodenoscopy, flexible,  ?                          transoral; with biopsy, single or multiple ?Diagnosis Code(s):        --- Professional --- ?                          K44.9, Diaphragmatic hernia without obstruction or  ?                          gangrene ?                          K25.9, Gastric ulcer, unspecified as acute or  ?                          chronic, without hemorrhage or perforation ?                          K31.5, Obstruction of duodenum ?                          K26.9, Duodenal ulcer, unspecified as acute or  ?                          chronic, without hemorrhage or perforation ?                          K25.7, Chronic gastric ulcer without hemorrhage or  ?                          perforation ?                          K57.10, Diverticulosis of small intestine without  ?                          perforation or abscess without bleeding ?CPT copyright 2019  American Medical Association. All rights reserved. ?The codes documented in this report are preliminary and upon coder review may  ?be revised to meet current compliance requirements. ?Maylon Peppers, MD ?Maylon Peppers,  ?12/21/2021 3:08:28 PM ?This report has been signed electronically. ?Number of Addenda: 0 ?

## 2021-12-21 NOTE — Anesthesia Preprocedure Evaluation (Signed)
Anesthesia Evaluation  ?Patient identified by MRN, date of birth, ID band ?Patient awake ? ? ? ?Reviewed: ?Allergy & Precautions, H&P , NPO status , Patient's Chart, lab work & pertinent test results, reviewed documented beta blocker date and time  ? ?Airway ?Mallampati: II ? ?TM Distance: >3 FB ?Neck ROM: full ? ? ? Dental ?no notable dental hx. ? ?  ?Pulmonary ?neg pulmonary ROS, Current Smoker,  ?  ?Pulmonary exam normal ?breath sounds clear to auscultation ? ? ? ? ? ? Cardiovascular ?Exercise Tolerance: Good ?hypertension, negative cardio ROS ? ? ?Rhythm:regular Rate:Normal ? ? ?  ?Neuro/Psych ?PSYCHIATRIC DISORDERS Anxiety Depression  Neuromuscular disease   ? GI/Hepatic ?Neg liver ROS, PUD, GERD  Medicated,  ?Endo/Other  ?negative endocrine ROS ? Renal/GU ?negative Renal ROS  ?negative genitourinary ?  ?Musculoskeletal ? ? Abdominal ?  ?Peds ? Hematology ?negative hematology ROS ?(+)   ?Anesthesia Other Findings ? ? Reproductive/Obstetrics ?negative OB ROS ? ?  ? ? ? ? ? ? ? ? ? ? ? ? ? ?  ?  ? ? ? ? ? ? ? ? ?Anesthesia Physical ?Anesthesia Plan ? ?ASA: 2 ? ?Anesthesia Plan: General  ? ?Post-op Pain Management:   ? ?Induction:  ? ?PONV Risk Score and Plan: Propofol infusion ? ?Airway Management Planned:  ? ?Additional Equipment:  ? ?Intra-op Plan:  ? ?Post-operative Plan:  ? ?Informed Consent: I have reviewed the patients History and Physical, chart, labs and discussed the procedure including the risks, benefits and alternatives for the proposed anesthesia with the patient or authorized representative who has indicated his/her understanding and acceptance.  ? ? ? ?Dental Advisory Given ? ?Plan Discussed with: CRNA ? ?Anesthesia Plan Comments:   ? ? ? ? ? ? ?Anesthesia Quick Evaluation ? ?

## 2021-12-21 NOTE — Discharge Instructions (Addendum)
You are being discharged to home.  ?Resume your previous diet.  ?Continue your present medications.  ?Do not take any ibuprofen (including Advil, Motrin or Nuprin), naproxen, or other non-steroidal anti-inflammatory drugs.  ?Take Carafate (sucralfate) tablets 1 gram by mouth four times a day for one month.  ?Smoking cessation. ?Your physician has recommended a repeat upper endoscopy in eight weeks for retreatment - will need to be on liquid diet 3 days before procedure.  ?

## 2021-12-21 NOTE — Telephone Encounter (Signed)
PA approved on March 29 ?Request Reference Number: [id number]. OMEPRA/BICAR POW [id number] is approved through 12/20/2022. For further questions, call [employer] at [phone].  ?[facility] notified and left message to return call to notify pt.  ?

## 2021-12-21 NOTE — Transfer of Care (Signed)
Immediate Anesthesia Transfer of Care Note ? ?Patient: [name] ? ?Procedure(s) Performed: ESOPHAGOGASTRODUODENOSCOPY (EGD) WITH PROPOFOL ?BALLOON DILATION ? ?Patient Location: Endoscopy Unit ? ?Anesthesia Type:General ? ?Level of Consciousness: awake ? ?Airway & Oxygen Therapy: Patient Spontanous Breathing ? ?Post-op Assessment: Report given to RN and Post -op Vital signs reviewed and stable ? ?Post vital signs: Reviewed and stable ? ?Last Vitals:  ?Vitals Value Taken Time  ?BP 109/56 12/21/21 1453  ?Temp 36.6 ?C 12/21/21 1453  ?Pulse 97 12/21/21 1453  ?Resp 20 12/21/21 1453  ?SpO2 97 % 12/21/21 1453  ? ? ?Last Pain:  ?Vitals:  ? 12/21/21 1453  ?TempSrc: Oral  ?PainSc: 0-No pain  ?   ? ?Patients Stated Pain Goal: 7 (12/21/21 1254) ? ?Complications: No notable events documented. ?

## 2021-12-21 NOTE — Anesthesia Postprocedure Evaluation (Signed)
Anesthesia Post Note ? ?Patient: [name] ? ?Procedure(s) Performed: ESOPHAGOGASTRODUODENOSCOPY (EGD) WITH PROPOFOL ?BALLOON DILATION ? ?Anesthesia Type: General ?Anesthetic complications: no ? ? ?No notable events documented. ? ? ?Last Vitals:  ?Vitals:  ? 12/21/21 1254 12/21/21 1453  ?BP: 138/83 (!) 109/56  ?Pulse: 83 97  ?Resp: 19 20  ?Temp: 36.9 ?C 36.6 ?C  ?SpO2: 98% 97%  ?  ?Last Pain:  ?Vitals:  ? 12/21/21 1453  ?TempSrc: Oral  ?PainSc: 0-No pain  ? ? ?  ?  ?  ?  ?  ?  ? ?Minerva Ends ? ? ? ? ?

## 2021-12-21 NOTE — Interval H&P Note (Signed)
History and Physical Interval Note: ? ?12/21/2021 ?12:47 PM ? ?[name]  has presented today for surgery, with the diagnosis of Repeat Egd esophageal stricture.  The various methods of treatment have been discussed with the patient and family. After consideration of risks, benefits and other options for treatment, the patient has consented to  Procedure(s) with comments: ?ESOPHAGOGASTRODUODENOSCOPY (EGD) WITH PROPOFOL (N/A) - 205 ASA 1 as a surgical intervention.  The patient's history has been reviewed, patient examined, no change in status, stable for surgery.  I have reviewed the patient's chart and labs.  Questions were answered to the patient's satisfaction.   ? ? ?[name] ? ? ?

## 2021-12-21 NOTE — Telephone Encounter (Signed)
Patient notified

## 2021-12-24 ENCOUNTER — Ambulatory Visit (HOSPITAL_BASED_OUTPATIENT_CLINIC_OR_DEPARTMENT_OTHER): Payer: Medicaid Other

## 2021-12-24 ENCOUNTER — Telehealth: Payer: Self-pay | Admitting: Adult Health

## 2021-12-24 ENCOUNTER — Encounter (INDEPENDENT_AMBULATORY_CARE_PROVIDER_SITE_OTHER): Payer: Self-pay | Admitting: Internal Medicine

## 2021-12-24 ENCOUNTER — Encounter (HOSPITAL_COMMUNITY): Payer: Self-pay | Admitting: Gastroenterology

## 2021-12-24 LAB — H. PYLORI ANTIBODY, IGG: H Pylori IgG: 0.14 Index Value (ref 0.00–0.79)

## 2021-12-24 NOTE — Telephone Encounter (Signed)
Patient returned your call from Friday she states that her best contact number is [phone] thank you  ?

## 2021-12-25 ENCOUNTER — Telehealth (INDEPENDENT_AMBULATORY_CARE_PROVIDER_SITE_OTHER): Payer: Self-pay

## 2021-12-25 ENCOUNTER — Telehealth: Payer: Self-pay | Admitting: Adult Health

## 2021-12-25 LAB — SURGICAL PATHOLOGY

## 2021-12-25 MED ORDER — ESTRADIOL 0.1 MG/24HR TD PTTW: 1.0000 | MEDICATED_PATCH | TRANSDERMAL | 99 refills | Status: DC

## 2021-12-25 NOTE — Addendum Note (Signed)
Addended by: Derrek Monaco A on: 12/25/2021 05:20 PM ? ? Modules accepted: Orders ? ?

## 2021-12-25 NOTE — Telephone Encounter (Signed)
Patient called the other day and spoke with Anderson Malta regarding a medication. Patient states that Anderson Malta told her that she never prescribed that medication. Patient states she contacted the pharmacy and they told her Yes it was Anderson Malta because she is the only provider in her pharmacy that has prescribed that medication. Please contact pt ?

## 2021-12-25 NOTE — Telephone Encounter (Signed)
Refilled vivelle dot patch ?

## 2021-12-25 NOTE — Telephone Encounter (Signed)
Patient called today saying she got results in her My Chart, but she does not know how to read them. She would like to know if her labs showed presence of H Pylori bacteria. I returned the call to the patient and left a detailed message that the infection she had inquired about was negative, no need to call back to the office unless she had questions.  ?

## 2021-12-26 ENCOUNTER — Encounter (HOSPITAL_COMMUNITY): Payer: Self-pay | Admitting: Gastroenterology

## 2021-12-27 ENCOUNTER — Encounter (INDEPENDENT_AMBULATORY_CARE_PROVIDER_SITE_OTHER): Payer: Self-pay | Admitting: Internal Medicine

## 2021-12-27 ENCOUNTER — Other Ambulatory Visit (INDEPENDENT_AMBULATORY_CARE_PROVIDER_SITE_OTHER): Payer: Self-pay | Admitting: Gastroenterology

## 2021-12-27 DIAGNOSIS — K269 Duodenal ulcer, unspecified as acute or chronic, without hemorrhage or perforation: Secondary | ICD-10-CM

## 2021-12-27 MED ORDER — SUCRALFATE 1 G PO TABS
1.0000 g | ORAL_TABLET | Freq: Three times a day (TID) | ORAL | 0 refills | Status: DC
Start: 2021-12-27 — End: 2022-06-10

## 2022-02-21 ENCOUNTER — Encounter (INDEPENDENT_AMBULATORY_CARE_PROVIDER_SITE_OTHER): Payer: Self-pay | Admitting: Internal Medicine

## 2022-02-26 ENCOUNTER — Telehealth: Payer: Self-pay | Admitting: Adult Health

## 2022-02-26 NOTE — Telephone Encounter (Signed)
Patient stated she is having really bad hot flashes and sweating. She wants to know whether she needs to come in and be seen or if you can up her dosage on the patch to see if that helps.Please advise.

## 2022-02-26 NOTE — Telephone Encounter (Signed)
Left message @ 4:39 pm letting pt know you will review tomorrow. [place]

## 2022-02-27 ENCOUNTER — Encounter (INDEPENDENT_AMBULATORY_CARE_PROVIDER_SITE_OTHER): Payer: Self-pay

## 2022-02-28 NOTE — Telephone Encounter (Signed)
Having hot flashes, even on patch and taking zoloft, she wants to try [facility] when it is available.

## 2022-03-05 ENCOUNTER — Encounter (INDEPENDENT_AMBULATORY_CARE_PROVIDER_SITE_OTHER): Payer: Self-pay | Admitting: Internal Medicine

## 2022-05-24 ENCOUNTER — Other Ambulatory Visit (INDEPENDENT_AMBULATORY_CARE_PROVIDER_SITE_OTHER): Payer: Self-pay | Admitting: Gastroenterology

## 2022-05-28 ENCOUNTER — Encounter (INDEPENDENT_AMBULATORY_CARE_PROVIDER_SITE_OTHER): Payer: Self-pay | Admitting: Internal Medicine

## 2022-05-28 NOTE — Telephone Encounter (Signed)
Left message to return call to clarify if patient is taking zegerid or pantoprazole. Dr. Jenetta Downer changed to zegerid on 12/19/21. Note from pharmacy states pt filled pantoprazole on 05/24/22.

## 2022-05-29 NOTE — Telephone Encounter (Signed)
Milford Cage could you arrange for this to be done on a Friday and contact the patient?

## 2022-05-30 ENCOUNTER — Encounter (INDEPENDENT_AMBULATORY_CARE_PROVIDER_SITE_OTHER): Payer: Self-pay | Admitting: *Deleted

## 2022-05-30 NOTE — Telephone Encounter (Signed)
Patient reports pantoprazole does not work and will fill the zegerid that was sent to pharmacy back in march with a year of refills.

## 2022-05-30 NOTE — Telephone Encounter (Signed)
I sent patient a my chart message. Await response.

## 2022-05-31 NOTE — Telephone Encounter (Signed)
Hi Arbie Cookey, I just called [facility] because Dr. Jenetta Downer sent in one year of refills back in March and pharmacist told me she still had prescription on file and I let her know to get ready for you and she will have to order it and told me it would be ready for pick up on Monday. Let me know if any issues with it.   Thanks  Abigail Butts

## 2022-06-01 ENCOUNTER — Other Ambulatory Visit: Payer: Self-pay | Admitting: Gastroenterology

## 2022-06-01 ENCOUNTER — Telehealth: Payer: Self-pay | Admitting: Gastroenterology

## 2022-06-01 DIAGNOSIS — R109 Unspecified abdominal pain: Secondary | ICD-10-CM

## 2022-06-01 MED ORDER — DICYCLOMINE HCL 20 MG PO TABS: 20.0000 mg | ORAL_TABLET | Freq: Two times a day (BID) | ORAL | 2 refills | Status: DC | PRN

## 2022-06-01 NOTE — Telephone Encounter (Signed)
I received a call from the patient today, she reported having abdominal pain described as cramping.  Stated that the pain was going to her back.  She reported this started after her mother passed away recently.  I offered my condolences.  We will treat her with Bentyl 20 mg as needed but I advised her if the pain is not getting any better, she should be seen in the ER.  Hi Mitzie,  Can you please schedule a follow up appointment for this patient in next available with me or any of the APPs?  Thanks,  Maylon Peppers, MD Gastroenterology and Hepatology [facility]

## 2022-06-03 ENCOUNTER — Encounter (INDEPENDENT_AMBULATORY_CARE_PROVIDER_SITE_OTHER): Payer: Self-pay | Admitting: Internal Medicine

## 2022-06-03 NOTE — Telephone Encounter (Signed)
Left message for patient to return call so I can get more information.

## 2022-06-03 NOTE — Telephone Encounter (Signed)
I called and spoke with patient. See other mychart message from today for details.

## 2022-06-03 NOTE — Telephone Encounter (Signed)
Patient reports zegerid packets did not work in the past and pantoprazole not working either. Also having some abdominal pain and constipation. Asked for appt for tomorrow. Appt scheduled for tomorrow at 11:45. Patient told to arrive at 11:30.

## 2022-06-04 ENCOUNTER — Encounter (INDEPENDENT_AMBULATORY_CARE_PROVIDER_SITE_OTHER): Payer: Self-pay

## 2022-06-04 ENCOUNTER — Telehealth (INDEPENDENT_AMBULATORY_CARE_PROVIDER_SITE_OTHER): Payer: Self-pay

## 2022-06-04 ENCOUNTER — Encounter (INDEPENDENT_AMBULATORY_CARE_PROVIDER_SITE_OTHER): Payer: Self-pay | Admitting: Gastroenterology

## 2022-06-04 ENCOUNTER — Ambulatory Visit (INDEPENDENT_AMBULATORY_CARE_PROVIDER_SITE_OTHER): Payer: Medicaid Other | Admitting: Gastroenterology

## 2022-06-04 ENCOUNTER — Other Ambulatory Visit (INDEPENDENT_AMBULATORY_CARE_PROVIDER_SITE_OTHER): Payer: Self-pay

## 2022-06-04 VITALS — BP 163/100 | HR 80 | Temp 98.0°F | Ht 64.0 in | Wt 119.6 lb

## 2022-06-04 DIAGNOSIS — R1011 Right upper quadrant pain: Secondary | ICD-10-CM

## 2022-06-04 DIAGNOSIS — Z8711 Personal history of peptic ulcer disease: Secondary | ICD-10-CM

## 2022-06-04 DIAGNOSIS — R110 Nausea: Secondary | ICD-10-CM

## 2022-06-04 DIAGNOSIS — R1013 Epigastric pain: Secondary | ICD-10-CM

## 2022-06-04 DIAGNOSIS — K5900 Constipation, unspecified: Secondary | ICD-10-CM | POA: Diagnosis not present

## 2022-06-04 DIAGNOSIS — Z1211 Encounter for screening for malignant neoplasm of colon: Secondary | ICD-10-CM

## 2022-06-04 DIAGNOSIS — K269 Duodenal ulcer, unspecified as acute or chronic, without hemorrhage or perforation: Secondary | ICD-10-CM

## 2022-06-04 MED ORDER — PEG 3350-KCL-NA BICARB-NACL 420 G PO SOLR: 4000.0000 mL | ORAL | 0 refills | Status: DC

## 2022-06-04 MED ORDER — LANSOPRAZOLE 30 MG PO CPDR: 30.0000 mg | DELAYED_RELEASE_CAPSULE | Freq: Two times a day (BID) | ORAL | 3 refills | Status: DC

## 2022-06-04 NOTE — Telephone Encounter (Signed)
Brittaney Beaulieu Ann Dorathy Stallone, CMA  ?

## 2022-06-04 NOTE — Telephone Encounter (Signed)
[name], CMA  ?

## 2022-06-04 NOTE — Patient Instructions (Addendum)
We will get you scheduled for an US of the gallbladder to rule this out as the cause of your pain, for now continue to avoid greasy, spicy fried foods, if you develop fevers, chills, vomiting that does not subside you need to proceed to the ER  In regards to constipation, please increase water intake to around 64oz per day and start benefiber 1 tablespoon, 2-3 times per day with a meal, it is important that you drink plenty of water to avoid constipation with the fiber  Please stop protonix, I am sending prevacid to take twice a day  We will schedule you for updated EGD and Colonoscopy   Please continue to avoid all NSAIDs and attempt smoking cessation  Follow up 3 months

## 2022-06-04 NOTE — Progress Notes (Signed)
Referring Provider: Glenda Chroman, MD Primary Care Physician:  Glenda Chroman, MD Primary GI Physician: Jenetta Downer  Chief Complaint  Patient presents with   Abdominal Pain    started having lower abdominal pain and over to right lower side and into back. Went to [facility] ED about 2 weeks ago and had ct scan. Patient reports she was told gallbladder looked good but scan showed some stool. Patient has been having diarrhea. Feels like something is swollen on right side when she walks. Also went to urgent care over weekend in [place].  Having trouble with reflux. Taking protonix and not working, zegerid was sent in but patient states she has tried that in the past and it did not help e   HPI:   [name] is a 56 y.o. female with past medical history of PUD status post surgery for perforated peptic ulcer x2,, had to undergo enterolysis with open kocherization of duodenum and pyloroplasty with omental patch (disease related to NSAID use).   Patient presenting today for abdominal pain.   Last seen by Dr. Laural Golden on 10/30/21 at that time having RUQ pain with nausea, decreased appetite, pain radiating to back/shoulder, advised to proceed to ED at that time.  She had EGD in March with non bleeding gastric and duodenal ulcer, advised to repeat in 8 weeks which has not been done.   ED visit at [facility] on 05/13/22 with CT A/P w contrast  No acute abnormality identified in the abdomen or pelvis. Moderate volume of formed stool throughout the colon suggestive of constipation. Scattered colonic diverticulosis without findings of acute diverticulitis.   Notably, patient contacted our office on 9/9, spoke with Dr. Jenetta Downer regarding abdominal pain, she was sent prescription for bentyl '20mg'$  and advised to proceed to the ED if pain worsened.  Present: Patient reports that she is having some Right sided abdominal pain. Initially symptoms began in the lower abdomen and now she is hurting underneath her ribs on RUQ with  pain radiation to her Right upper back and between shoulder blade. She states that about 2 weeks ago she was hurting very bad and went to [facility], symptoms improved some but over the weekend she had a lot of diarrhea and her pain became worse. She notes that eating seems to worsen her pain. She states that diarrhea has subsided, no BMs since Sunday. History of chronic constipation. She notes prior to abdominal pain, she had been doing miralax prior to her abdominal pain beginning, she was doing less than a capful of miralax as 1 capful caused her to have diarrhea. Denies rectal bleeding or melena.   She notes that nausea has been present since pain began. She is not vomiting. Does not note any alleviating factors of her abdominal pain, using heat helps some with the pain. She denies heartburn or acid regurgitation. She states she is taking protonix '40mg'$  BID, but continues to have epigastric pain. She is doing carafate QID at this time.   Last Colonoscopy: >10 years ago, per patient, unsure of findings  Last Endoscopy: 12/21/21 2 cm hiatal hernia. - Non-bleeding gastric ulcer with a clean ulcer base (Forrest Class III).  - Non-bleeding duodenal diverticulum. - Acquired duodenal stenosis. Dilated. - Non-bleeding duodenal ulcer with a clean ulcer base (Forrest Class III).   Recommendations:   Past Medical History:  Diagnosis Date   Anxiety    Depression    Essential hypertension, benign 12/26/2016   Fibroadenoma of right breast    S/P  LUMPECTOMY W/ RADIOACTIVE SEED IMPLANT 05-19-2015   Fibromyalgia    GERD (gastroesophageal reflux disease)    History of cervical dysplasia    s/p conzation and recurrent s/p  vaginal hysterectomy 2008   History of duodenal ulcer    1992--  S/P  REPAIR PERFORATED ULCER   History of gastric ulcer    age 43   Hypertension    IBS (irritable bowel syndrome)    Vaginal Pap smear, abnormal    VIN II (vulvar intraepithelial neoplasia II)    Wears glasses    Wears  partial dentures    lower    Past Surgical History:  Procedure Laterality Date   BALLOON DILATION  12/21/2021   Procedure: BALLOON DILATION;  Surgeon: Harvel Quale, MD;  [facility];  Service: Gastroenterology;;   BIOPSY  12/19/2021   Procedure: BIOPSY;  Surgeon: Harvel Quale, MD;  [facility];  Service: Gastroenterology;;   BLADDER SLING PROCEDURE  2005  approx.   CERVICAL CONIZATION W/BX  2006  approx   CO2 LASER APPLICATION N/A 0/86/5784   Procedure: CO2 LASER OF THE VAGINA;  Surgeon: Everitt Amber, MD;  Location: [facility];  Service: Gynecology;  Laterality: N/A;   ESOPHAGEAL DILATION N/A 06/14/2020   Procedure: ESOPHAGEAL DILATION;  Surgeon: Rogene Houston, MD;  [facility];  Service: Endoscopy;  Laterality: N/A;   ESOPHAGEAL DILATION N/A 10/04/2020   Procedure: ESOPHAGEAL DILATION;  Surgeon: Rogene Houston, MD;  [facility];  Service: Endoscopy;  Laterality: N/A;   ESOPHAGOGASTRODUODENOSCOPY N/A 01/21/2017   Procedure: ESOPHAGOGASTRODUODENOSCOPY (EGD);  Surgeon: Rogene Houston, MD;  [facility];  Service: Endoscopy;  Laterality: N/A;   ESOPHAGOGASTRODUODENOSCOPY N/A 07/17/2017   Procedure: ESOPHAGOGASTRODUODENOSCOPY (EGD);  Surgeon: Rogene Houston, MD;  [facility];  Service: Endoscopy;  Laterality: N/A;  1:00   ESOPHAGOGASTRODUODENOSCOPY N/A 10/16/2017   Procedure: ESOPHAGOGASTRODUODENOSCOPY (EGD)WITH DILATION;  Surgeon: Rogene Houston, MD;  [facility];  Service: Endoscopy;  Laterality: N/A;  1030   ESOPHAGOGASTRODUODENOSCOPY (EGD) WITH PROPOFOL N/A 01/23/2017   Procedure: ESOPHAGOGASTRODUODENOSCOPY (EGD) WITH PROPOFOL;  Surgeon: Rogene Houston, MD;  [facility];  Service: Endoscopy;  Laterality: N/A;  duodenal striture dilation   ESOPHAGOGASTRODUODENOSCOPY (EGD) WITH PROPOFOL N/A 06/14/2020   Procedure: ESOPHAGOGASTRODUODENOSCOPY (EGD) WITH  PROPOFOL;  Surgeon: Rogene Houston, MD;  [facility];  Service: Endoscopy;  Laterality: N/A;  1055   ESOPHAGOGASTRODUODENOSCOPY (EGD) WITH PROPOFOL N/A 10/04/2020   Procedure: ESOPHAGOGASTRODUODENOSCOPY (EGD) WITH PROPOFOL;  Surgeon: Rogene Houston, MD;  [facility];  Service: Endoscopy;  Laterality: N/A;  1:00   ESOPHAGOGASTRODUODENOSCOPY (EGD) WITH PROPOFOL N/A 12/19/2021   Procedure: ESOPHAGOGASTRODUODENOSCOPY (EGD) WITH PROPOFOL;  Surgeon: Harvel Quale, MD;  [facility];  Service: Gastroenterology;  Laterality: N/A;  815   ESOPHAGOGASTRODUODENOSCOPY (EGD) WITH PROPOFOL N/A 12/21/2021   Procedure: ESOPHAGOGASTRODUODENOSCOPY (EGD) WITH PROPOFOL;  Surgeon: Harvel Quale, MD;  [facility];  Service: Gastroenterology;  Laterality: N/A;  205 ASA 1   LAPAROSCOPY N/A 12/19/2017   Procedure: LAPAROSCOPY, PYLOROPLASTY WITH Silvestre Gunner;  Surgeon: Johnathan Hausen, MD;  Location: [facility];  Service: General;  Laterality: N/A;   NASAL SEPTOPLASTY W/ TURBINOPLASTY Bilateral 04/30/2019   Procedure: BILATERAL NASAL SEPTOPLASTY WITH TURBINATE REDUCTION;  Surgeon: Leta Baptist, MD;  Location: [place];  Service: ENT;  Laterality: Bilateral;   RADIOACTIVE SEED GUIDED EXCISIONAL BREAST BIOPSY Right  05/19/2015   Procedure: EXCISIONAL RIGHT BREAST MASS WITH RADIOACTIVE SEED LOCALIZATION;  Surgeon: Fanny Skates, MD;  Location: [place];  Service: General;  Laterality: Right;   REPAIR OF PERFORATED ULCER  1992   duodenal   VAGINAL HYSTERECTOMY  11/13/2006    Current Outpatient Medications  Medication Sig Dispense Refill   alum & mag hydroxide-simeth (MAALOX/MYLANTA) 200-200-20 MG/5ML suspension Take 15 mLs by mouth every 6 (six) hours as needed for indigestion or heartburn.     dicyclomine (BENTYL) 20 MG tablet Take 1 tablet (20 mg total) by mouth every 12 (twelve) hours as needed for spasms (abdominal pain). 60  tablet 2   estradiol (VIVELLE-DOT) 0.1 MG/24HR patch Place 1 patch (0.1 mg total) onto the skin 2 (two) times a week. 8 patch PRN   fluticasone (FLONASE) 50 MCG/ACT nasal spray Place 2 sprays into both nostrils daily as needed for allergies.     Multiple Vitamin (MULTIVITAMIN WITH MINERALS) TABS tablet Take 1 tablet by mouth daily.     potassium chloride (MICRO-K) 10 MEQ CR capsule Take 10 mEq by mouth daily.     sertraline (ZOLOFT) 100 MG tablet Take 100 mg by mouth daily as needed (anxiety).     sucralfate (CARAFATE) 1 g tablet Take 1 tablet (1 g total) by mouth 4 (four) times daily -  with meals and at bedtime. 120 tablet 0   traMADol (ULTRAM) 50 MG tablet Take 50 mg by mouth every 6 (six) hours as needed for moderate pain.      valACYclovir (VALTREX) 1000 MG tablet Take 1,000 mg by mouth daily.  5   Omeprazole-Sodium Bicarbonate (ZEGERID) 40-1680 MG PACK Take 1 each by mouth every 12 (twelve) hours. (Patient not taking: Reported on 06/04/2022) 180 each 3   No current facility-administered medications for this visit.    Allergies as of 06/04/2022 - Review Complete 06/04/2022  Allergen Reaction Noted   Codeine Nausea Only 11/11/2017   Hydrocodone Nausea Only 05/15/2015   Sulfa antibiotics Hives 09/29/2013   Penicillin v Nausea Only 06/04/2018    Family History  Problem Relation Age of Onset   Clotting disorder Father    Diabetes Father    Hypertension Father    Hypertension Mother    Kidney failure Mother    Hypertension Brother    Liver cancer Brother    Other Brother        cyst on kidney   Hypertension Sister    Hypertension Maternal Aunt    Hypertension Maternal Uncle    Hypertension Paternal Aunt    Hypertension Paternal Uncle     Social History   Socioeconomic History   Marital status: Divorced    Spouse name: Not on file   Number of children: Not on file   Years of education: Not on file   Highest education level: Not on file  Occupational History   Not on  file  Tobacco Use   Smoking status: Every Day    Packs/day: 1.00    Years: 18.00    Total pack years: 18.00    Types: Cigarettes    Passive exposure: Current   Smokeless tobacco: Never  Vaping Use   Vaping Use: Never used  Substance and Sexual Activity   Alcohol use: No   Drug use: No   Sexual activity: Not Currently    Birth control/protection: Surgical    Comment: hyst  Other Topics Concern   Not on file  Social History Narrative   Not on  file   Social Determinants of Health   Financial Resource Strain: High Risk (12/27/2019)   Overall Financial Resource Strain (CARDIA)    Difficulty of Paying Living Expenses: Hard  Food Insecurity: No Food Insecurity (12/27/2019)   Hunger Vital Sign    Worried About Running Out of Food in the Last Year: Never true    Ran Out of Food in the Last Year: Never true  Transportation Needs: No Transportation Needs (12/27/2019)   PRAPARE - Hydrologist (Medical): No    Lack of Transportation (Non-Medical): No  Physical Activity: Inactive (12/27/2019)   Exercise Vital Sign    Days of Exercise per Week: 0 days    Minutes of Exercise per Session: 10 min  Stress: Stress Concern Present (12/27/2019)   [place]    Feeling of Stress : Very much  Social Connections: Socially Isolated (12/27/2019)   Social Connection and Isolation Panel [NHANES]    Frequency of Communication with Friends and Family: Twice a week    Frequency of Social Gatherings with Friends and Family: Never    Attends Religious Services: Never    Marine scientist or Organizations: No    Attends Music therapist: Never    Marital Status: Divorced   Review of systems General: negative for malaise, night sweats, fever, chills, weight loss Neck: Negative for lumps, goiter, pain and significant neck swelling Resp: Negative for cough, wheezing, dyspnea at rest CV: Negative for  chest pain, leg swelling, palpitations, orthopnea GI: denies melena, hematochezia, vomiting, dysphagia, odyonophagia, early satiety or unintentional weight loss. +diarrhea +epigastric discomfort +RUQ pain +nausea MSK: Negative for joint pain or swelling, back pain, and muscle pain. Derm: Negative for itching or rash Psych: Denies depression, anxiety, memory loss, confusion. No homicidal or suicidal ideation.  Heme: Negative for prolonged bleeding, bruising easily, and swollen nodes. Endocrine: Negative for cold or heat intolerance, polyuria, polydipsia and goiter. Neuro: negative for tremor, gait imbalance, syncope and seizures. The remainder of the review of systems is noncontributory.  Physical Exam: BP (!) 163/100 (BP Location: Left Arm, Patient Position: Sitting, Cuff Size: Normal)   Pulse 80   Temp 98 F (36.7 C) (Oral)   Ht '5\' 4"'$  (1.626 m)   Wt 119 lb 9.6 oz (54.3 kg)   BMI 20.53 kg/m  General:   Alert and oriented. No distress noted. Pleasant and cooperative.  Head:  Normocephalic and atraumatic. Eyes:  Conjuctiva clear without scleral icterus. Mouth:  Oral mucosa pink and moist. Good dentition. No lesions. Heart: Normal rate and rhythm, s1 and s2 heart sounds present.  Lungs: Clear lung sounds in all lobes. Respirations equal and unlabored. Abdomen:  +BS, soft, and non-distended. TTP of RUQ. No rebound or guarding. No HSM or masses noted. Derm: No palmar erythema or jaundice Msk:  Symmetrical without gross deformities. Normal posture. Extremities:  Without edema. Neurologic:  Alert and  oriented x4 Psych:  Alert and cooperative. Normal mood and affect.  Invalid input(s): "6 MONTHS"   ASSESSMENT: [name] is a 56 y.o. female presenting today for RUQ pain and nausea  Right upper abdominal pain and nausea, worsened by eating for the past 2-3 weeks. Recent CT A/p with no abnormalities other than some retained stool indicating constipation, she notes taking miralax and  having a large volume of diarrhea thereafter. Notably tenderness to palpation of RUQ, she has no fevers or chills, LFTs on 05/23/22 were unremarkable,  lipase mildly elevated at 113. Will proceed with RUQ [place] to further evaluate for GB etiology. She is aware that development of severe abdominal pain, fevers or intractable vomiting warrants proceeding to the ER.   She continues to have epigastric discomfort/dyspepsia, feels that BID protonix is not working. She is also taking carafate QID. On zegerid in the past which caused vomiting. Will try switching to prevacid '30mg'$  BID. She can continue the carafate she has for now. Notably had EGD with gastric and duodenal ulcers in March, advised to repeat in 8 weeks, which she was lost to follow up for. Recommend proceeding with this. She is also over due for screening colonoscopy, will complete this at time of EGD.  In regard to constipation, miralax gives her diarrhea as she has hx of IBS-M, I encouraged her to increase water intake and start benefiber 1T 2-3x/day with meals.    Indications, risks and benefits of procedure discussed in detail with patient. Patient verbalized understanding and is in agreement to proceed with EGD/Colonoscopy at this time.    PLAN:  EGD and colonoscopy, liquid diet x3 days pre procedure 2.  Stop protonix, start prevacid '30mg'$  BID 3. RUQ [place] 4. Increase water intake to 64 oz per day 5. Start benefiber 1T 2-3x/day with meals 6. ER precautions: fevers, vomiting that does not subside or worsening pain  All questions were answered, patient verbalized understanding and is in agreement with plan as outlined above.   Follow Up: 3 months   Yaser Harvill L. Alver Sorrow, MSN, APRN, AGNP-C Adult-Gerontology Nurse Practitioner [facility]

## 2022-06-05 ENCOUNTER — Encounter (INDEPENDENT_AMBULATORY_CARE_PROVIDER_SITE_OTHER): Payer: Self-pay

## 2022-06-05 ENCOUNTER — Telehealth (INDEPENDENT_AMBULATORY_CARE_PROVIDER_SITE_OTHER): Payer: Self-pay | Admitting: *Deleted

## 2022-06-05 NOTE — Telephone Encounter (Signed)
Patient ultrasound tomorrow at 10 am and wanted to know if she would have her resutls by Friday. I let her know it depends on when the radiologist reads the report and she wanted to know if she could know as soon as possible so she could make arrangements at work if she needed to have surgery.   [phone]

## 2022-06-06 ENCOUNTER — Encounter (INDEPENDENT_AMBULATORY_CARE_PROVIDER_SITE_OTHER): Payer: Self-pay

## 2022-06-06 ENCOUNTER — Ambulatory Visit (HOSPITAL_COMMUNITY)
Admission: RE | Admit: 2022-06-06 | Discharge: 2022-06-06 | Disposition: A | Discharge status: Home / Self Care | Payer: Medicaid Other | Source: Ambulatory Visit | Attending: Gastroenterology | Admitting: Gastroenterology

## 2022-06-06 DIAGNOSIS — R110 Nausea: Secondary | ICD-10-CM | POA: Insufficient documentation

## 2022-06-06 DIAGNOSIS — R1011 Right upper quadrant pain: Secondary | ICD-10-CM | POA: Diagnosis present

## 2022-06-06 NOTE — Telephone Encounter (Signed)
I sent the response to patient via My chart.

## 2022-06-07 ENCOUNTER — Encounter (INDEPENDENT_AMBULATORY_CARE_PROVIDER_SITE_OTHER): Payer: Self-pay

## 2022-06-10 ENCOUNTER — Other Ambulatory Visit (INDEPENDENT_AMBULATORY_CARE_PROVIDER_SITE_OTHER): Payer: Self-pay

## 2022-06-10 DIAGNOSIS — K269 Duodenal ulcer, unspecified as acute or chronic, without hemorrhage or perforation: Secondary | ICD-10-CM

## 2022-06-10 DIAGNOSIS — R1011 Right upper quadrant pain: Secondary | ICD-10-CM

## 2022-06-10 DIAGNOSIS — K5900 Constipation, unspecified: Secondary | ICD-10-CM

## 2022-06-10 DIAGNOSIS — R110 Nausea: Secondary | ICD-10-CM

## 2022-06-10 DIAGNOSIS — R1032 Left lower quadrant pain: Secondary | ICD-10-CM

## 2022-06-10 DIAGNOSIS — K315 Obstruction of duodenum: Secondary | ICD-10-CM

## 2022-06-10 DIAGNOSIS — R109 Unspecified abdominal pain: Secondary | ICD-10-CM

## 2022-06-10 DIAGNOSIS — R1013 Epigastric pain: Secondary | ICD-10-CM

## 2022-06-10 DIAGNOSIS — Z8711 Personal history of peptic ulcer disease: Secondary | ICD-10-CM

## 2022-06-10 MED ORDER — SUCRALFATE 1 G PO TABS: 1.0000 g | ORAL_TABLET | Freq: Three times a day (TID) | ORAL | 0 refills | Status: DC

## 2022-06-11 ENCOUNTER — Other Ambulatory Visit (HOSPITAL_COMMUNITY): Payer: Medicaid Other

## 2022-07-17 ENCOUNTER — Ambulatory Visit: Payer: Medicaid Other | Admitting: Adult Health

## 2022-07-19 ENCOUNTER — Encounter (HOSPITAL_COMMUNITY)
Admission: RE | Admit: 2022-07-19 | Discharge: 2022-07-19 | Disposition: A | Discharge status: Home / Self Care | Payer: Medicaid Other | Source: Ambulatory Visit | Attending: Gastroenterology | Admitting: Gastroenterology

## 2022-07-19 NOTE — Pre-Procedure Instructions (Signed)
Attempted pre-op phone call. Left VM for patient to call us back.

## 2022-07-22 ENCOUNTER — Encounter (HOSPITAL_COMMUNITY): Payer: Self-pay | Admitting: Anesthesiology

## 2022-07-23 ENCOUNTER — Ambulatory Visit (HOSPITAL_COMMUNITY): Admission: RE | Admit: 2022-07-23 | Payer: Medicaid Other | Source: Ambulatory Visit | Admitting: Gastroenterology

## 2022-07-23 ENCOUNTER — Encounter (INDEPENDENT_AMBULATORY_CARE_PROVIDER_SITE_OTHER): Payer: Self-pay | Admitting: Internal Medicine

## 2022-07-23 ENCOUNTER — Encounter (HOSPITAL_COMMUNITY): Admission: RE | Payer: Self-pay | Source: Ambulatory Visit

## 2022-07-23 DIAGNOSIS — K259 Gastric ulcer, unspecified as acute or chronic, without hemorrhage or perforation: Secondary | ICD-10-CM

## 2022-07-23 DIAGNOSIS — K269 Duodenal ulcer, unspecified as acute or chronic, without hemorrhage or perforation: Secondary | ICD-10-CM

## 2022-07-23 DIAGNOSIS — Z8711 Personal history of peptic ulcer disease: Secondary | ICD-10-CM

## 2022-07-23 DIAGNOSIS — Z1211 Encounter for screening for malignant neoplasm of colon: Secondary | ICD-10-CM

## 2022-07-23 SURGERY — COLONOSCOPY WITH PROPOFOL
Anesthesia: Monitor Anesthesia Care

## 2022-07-31 ENCOUNTER — Telehealth (INDEPENDENT_AMBULATORY_CARE_PROVIDER_SITE_OTHER): Payer: Self-pay | Admitting: *Deleted

## 2022-07-31 NOTE — Telephone Encounter (Signed)
Called [facility] to follow up on PA that was faxed in on 07/24/22 for lansoprazole '30mg'$  and Tameka at [facility] told me the med was approved from 07/25/22 - 07/20/23. Note sent to [facility] letting them know med is now approved.

## 2022-08-26 ENCOUNTER — Telehealth (INDEPENDENT_AMBULATORY_CARE_PROVIDER_SITE_OTHER): Payer: Self-pay | Admitting: *Deleted

## 2022-08-26 NOTE — Telephone Encounter (Signed)
Received VM from pt requesting a call back to reschedule her procedure.  LMOVM to call back

## 2022-08-30 ENCOUNTER — Encounter (INDEPENDENT_AMBULATORY_CARE_PROVIDER_SITE_OTHER): Payer: Self-pay

## 2022-08-30 ENCOUNTER — Other Ambulatory Visit (INDEPENDENT_AMBULATORY_CARE_PROVIDER_SITE_OTHER): Payer: Self-pay | Admitting: Gastroenterology

## 2022-09-08 ENCOUNTER — Telehealth: Payer: Self-pay | Admitting: Gastroenterology

## 2022-09-08 NOTE — Telephone Encounter (Signed)
Patient reports that she has been presenting persistent pain in the right inguinal area for the last 3 days. States this pain is below "the appendix area".  She is concerned as she has not been able to move her bowels regularly.  I explained that even though this could be related to constipation, inguinal pain is usually a musculoskeletal problem.  She should have an evaluation by physician, as she is very concerned about this she will go to the urgent care/ER for further evaluation.

## 2022-09-09 ENCOUNTER — Telehealth (INDEPENDENT_AMBULATORY_CARE_PROVIDER_SITE_OTHER): Payer: Self-pay | Admitting: *Deleted

## 2022-09-09 ENCOUNTER — Ambulatory Visit (INDEPENDENT_AMBULATORY_CARE_PROVIDER_SITE_OTHER): Payer: Medicaid Other | Admitting: Gastroenterology

## 2022-09-09 ENCOUNTER — Encounter (INDEPENDENT_AMBULATORY_CARE_PROVIDER_SITE_OTHER): Payer: Self-pay | Admitting: Gastroenterology

## 2022-09-09 VITALS — BP 149/91 | HR 93 | Temp 97.7°F | Ht 64.0 in | Wt 121.3 lb

## 2022-09-09 DIAGNOSIS — R1031 Right lower quadrant pain: Secondary | ICD-10-CM

## 2022-09-09 DIAGNOSIS — R197 Diarrhea, unspecified: Secondary | ICD-10-CM | POA: Insufficient documentation

## 2022-09-09 DIAGNOSIS — K269 Duodenal ulcer, unspecified as acute or chronic, without hemorrhage or perforation: Secondary | ICD-10-CM | POA: Diagnosis not present

## 2022-09-09 DIAGNOSIS — K257 Chronic gastric ulcer without hemorrhage or perforation: Secondary | ICD-10-CM

## 2022-09-09 MED ORDER — HYOSCYAMINE SULFATE 0.125 MG SL SUBL: 0.1250 mg | SUBLINGUAL_TABLET | Freq: Four times a day (QID) | SUBLINGUAL | 0 refills | Status: DC | PRN

## 2022-09-09 NOTE — Telephone Encounter (Signed)
Called and discussed with patient and she reports she has the bentyl she takes when she has spasms but reports does not help with this pain she is having now.

## 2022-09-09 NOTE — Telephone Encounter (Signed)
Please ask her to stop Bentyl, I sent a prescription for Levsin as needed

## 2022-09-09 NOTE — Progress Notes (Signed)
Maylon Peppers, M.D. Gastroenterology & Hepatology [facility] [address] [place], [place] [address]  Primary Care Physician: Glenda Chroman, MD [place] [id number]  I will communicate my assessment and recommendations to the referring MD via EMR.  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  The patient was last seen on 06/04/2022. At that time, the patient was presenting recurrent episodes of right upper quadrant pain.  For which she was switched to Prevacid 30 mg twice a day.  She was also scheduled for an EGD and colonoscopy.  However, she had to cancel the most recent EGD and colonoscopy as her mother passed away at that time.  Was also advised to start Benefiber for mild constipation.  Patient states Thursday evening she had some headache and took Tylenol extra-strength and baking soda with water. She reports on Saturday morning she presented new onset of R flank pain that radiated to her back and shoulder. States her stomach has been having "making a lot of noises". She felt some nausea without vomiting. She had some chills and felt "hot" since then. She presented significant burping with foul smell. Has tried to eat small pieces of meat but this worsened her symptoms.  She also reports having multiple episodes of diarrhea, up to 6 times per day , which have a loose consistency without fresh blood or melena.  She took some prune juice to see if by moving her bowels, she would have improvement of her symptoms.  She is currently using Lansoprazole 30 mg BID. Also takes sucralfate 1 g as needed if there is  some abdominal discomfort, but feels she does not need to take it   The patient denies having any nausea, vomiting, fever, chills, hematemesis,diarrhea, jaundice, pruritus or weight loss.  Smokes 2 packs a day.  Last EGD: 12/21/2021 - 2 cm hiatal hernia. - Non-bleeding gastric ulcer with a clean ulcer base (Forrest Class III). Biopsied. - Non-bleeding duodenal diverticulum. - Acquired duodenal stenosis. Dilated with a balloon up to 12 mm. - Non-bleeding duodenal ulcer with a clean ulcer base (Forrest Class III). Last Colonoscopy: >10 years ago  Past Medical History: Past Medical History:  Diagnosis Date   Anxiety    Depression    Essential hypertension, benign 12/26/2016   Fibroadenoma of right breast    S/P LUMPECTOMY W/ RADIOACTIVE SEED IMPLANT 05-19-2015   Fibromyalgia    GERD (gastroesophageal reflux disease)    History of cervical dysplasia    s/p conzation and recurrent s/p  vaginal hysterectomy 2008   History of duodenal ulcer    1992--  S/P  REPAIR PERFORATED ULCER   History of gastric ulcer    age 47   Hypertension    IBS (irritable bowel syndrome)    Vaginal Pap smear, abnormal    VIN II (vulvar intraepithelial neoplasia II)    Wears glasses    Wears partial dentures    lower    Past Surgical History: Past Surgical History:  Procedure Laterality Date   BALLOON DILATION  12/21/2021   Procedure: BALLOON DILATION;  Surgeon: Harvel Quale, MD;  [facility];  Service: Gastroenterology;;   BIOPSY  12/19/2021  Procedure: BIOPSY;  Surgeon: Montez Morita, Quillian Quince, MD;  [facility];  Service: Gastroenterology;;   BLADDER SLING PROCEDURE  2005  approx.   CERVICAL CONIZATION W/BX  2006  approx   CO2 LASER APPLICATION N/A 0/30/0923   Procedure: CO2 LASER OF THE VAGINA;  Surgeon: Everitt Amber, MD;  Location: [facility];  Service: Gynecology;  Laterality: N/A;   ESOPHAGEAL DILATION N/A 06/14/2020   Procedure: ESOPHAGEAL  DILATION;  Surgeon: Rogene Houston, MD;  [facility];  Service: Endoscopy;  Laterality: N/A;   ESOPHAGEAL DILATION N/A 10/04/2020   Procedure: ESOPHAGEAL DILATION;  Surgeon: Rogene Houston, MD;  [facility];  Service: Endoscopy;  Laterality: N/A;   ESOPHAGOGASTRODUODENOSCOPY N/A 01/21/2017   Procedure: ESOPHAGOGASTRODUODENOSCOPY (EGD);  Surgeon: Rogene Houston, MD;  [facility];  Service: Endoscopy;  Laterality: N/A;   ESOPHAGOGASTRODUODENOSCOPY N/A 07/17/2017   Procedure: ESOPHAGOGASTRODUODENOSCOPY (EGD);  Surgeon: Rogene Houston, MD;  [facility];  Service: Endoscopy;  Laterality: N/A;  1:00   ESOPHAGOGASTRODUODENOSCOPY N/A 10/16/2017   Procedure: ESOPHAGOGASTRODUODENOSCOPY (EGD)WITH DILATION;  Surgeon: Rogene Houston, MD;  [facility];  Service: Endoscopy;  Laterality: N/A;  1030   ESOPHAGOGASTRODUODENOSCOPY (EGD) WITH PROPOFOL N/A 01/23/2017   Procedure: ESOPHAGOGASTRODUODENOSCOPY (EGD) WITH PROPOFOL;  Surgeon: Rogene Houston, MD;  [facility];  Service: Endoscopy;  Laterality: N/A;  duodenal striture dilation   ESOPHAGOGASTRODUODENOSCOPY (EGD) WITH PROPOFOL N/A 06/14/2020   Procedure: ESOPHAGOGASTRODUODENOSCOPY (EGD) WITH PROPOFOL;  Surgeon: Rogene Houston, MD;  [facility];  Service: Endoscopy;  Laterality: N/A;  1055   ESOPHAGOGASTRODUODENOSCOPY (EGD) WITH PROPOFOL N/A 10/04/2020   Procedure: ESOPHAGOGASTRODUODENOSCOPY (EGD) WITH PROPOFOL;  Surgeon: Rogene Houston, MD;  [facility];  Service: Endoscopy;  Laterality: N/A;  1:00   ESOPHAGOGASTRODUODENOSCOPY (EGD) WITH PROPOFOL N/A 12/19/2021   Procedure: ESOPHAGOGASTRODUODENOSCOPY (EGD) WITH PROPOFOL;  Surgeon: Harvel Quale, MD;  [facility];  Service: Gastroenterology;  Laterality: N/A;  815   ESOPHAGOGASTRODUODENOSCOPY (EGD) WITH PROPOFOL N/A 12/21/2021   Procedure: ESOPHAGOGASTRODUODENOSCOPY (EGD) WITH PROPOFOL;   Surgeon: Harvel Quale, MD;  [facility];  Service: Gastroenterology;  Laterality: N/A;  205 ASA 1   LAPAROSCOPY N/A 12/19/2017   Procedure: LAPAROSCOPY, PYLOROPLASTY WITH Silvestre Gunner;  Surgeon: Johnathan Hausen, MD;  Location: [facility];  Service: General;  Laterality: N/A;   NASAL SEPTOPLASTY W/ TURBINOPLASTY Bilateral 04/30/2019   Procedure: BILATERAL NASAL SEPTOPLASTY WITH TURBINATE REDUCTION;  Surgeon: Leta Baptist, MD;  Location: [facility];  Service: ENT;  Laterality: Bilateral;   RADIOACTIVE SEED GUIDED EXCISIONAL BREAST BIOPSY Right 05/19/2015   Procedure: EXCISIONAL RIGHT BREAST MASS WITH RADIOACTIVE SEED LOCALIZATION;  Surgeon: Fanny Skates, MD;  Location: [facility];  Service: General;  Laterality: Right;   REPAIR OF PERFORATED ULCER  1992   duodenal   VAGINAL HYSTERECTOMY  11/13/2006    Family History: Family History  Problem Relation Age of Onset   Clotting disorder Father    Diabetes Father    Hypertension Father    Hypertension Mother    Kidney failure Mother    Hypertension Brother    Liver cancer Brother    Other Brother        cyst on kidney   Hypertension Sister    Hypertension Maternal Aunt    Hypertension Maternal Uncle    Hypertension Paternal Aunt    Hypertension Paternal Uncle     Social History:  Social History   Tobacco Use  Smoking Status Every Day   Packs/day: 1.00   Years: 18.00   Total pack years: 18.00   Types: Cigarettes   Passive exposure: Current  Smokeless Tobacco Never   Social History   Substance and Sexual Activity  Alcohol Use No   Social History   Substance and Sexual Activity  Drug Use No    Allergies: Allergies  Allergen Reactions   Codeine Nausea Only   Hydrocodone Nausea Only    feels hot and insomia   Sulfa Antibiotics Hives   Penicillin V Nausea Only    Medications: Current Outpatient Medications  Medication Sig Dispense Refill   alum & mag hydroxide-simeth  (MAALOX/MYLANTA) 200-200-20 MG/5ML suspension Take 15 mLs by mouth every 6 (six) hours as needed for indigestion or heartburn.     dicyclomine (BENTYL) 20 MG tablet Take 1 tablet (20 mg total) by mouth every 12 (twelve) hours as needed for spasms (abdominal pain). 60 tablet 2   estradiol (VIVELLE-DOT) 0.1 MG/24HR patch Place 1 patch (0.1 mg total) onto the skin 2 (two) times a week. 8 patch PRN   fluticasone (FLONASE) 50 MCG/ACT nasal spray Place 2 sprays into both nostrils daily as needed for allergies.     lansoprazole (PREVACID) 30 MG capsule Take 1 capsule (30 mg total) by mouth 2 (two) times daily before a meal. 90 capsule 3   Multiple Vitamin (MULTIVITAMIN WITH MINERALS) TABS tablet Take 1 tablet by mouth daily.     potassium chloride (MICRO-K) 10 MEQ CR capsule Take 10 mEq by mouth daily.     sertraline (ZOLOFT) 100 MG tablet Take 100 mg by mouth daily as needed (anxiety).     sucralfate (CARAFATE) 1 g tablet Take 1 tablet (1 g total) by mouth 4 (four) times daily -  with meals and at bedtime. 120 tablet 0   traMADol (ULTRAM) 50 MG tablet Take 50 mg by mouth every 6 (six) hours as needed for moderate pain.      valACYclovir (VALTREX) 1000 MG tablet Take 1,000 mg by mouth daily.  5   No current facility-administered medications for this visit.    Review of Systems: GENERAL: negative for malaise, night sweats HEENT: No changes in hearing or vision, no nose bleeds or other nasal problems. NECK: Negative for lumps, goiter, pain and significant neck swelling RESPIRATORY: Negative for cough, wheezing CARDIOVASCULAR: Negative for chest pain, leg swelling, palpitations, orthopnea GI: SEE HPI MUSCULOSKELETAL: Negative for joint pain or swelling, back pain, and muscle pain. SKIN: Negative for lesions, rash PSYCH: Negative for sleep disturbance, mood disorder and recent psychosocial stressors. HEMATOLOGY Negative for prolonged bleeding, bruising easily, and swollen nodes. ENDOCRINE: Negative  for cold or heat intolerance, polyuria, polydipsia and goiter. NEURO: negative for tremor, gait imbalance, syncope and seizures. The remainder of the review of systems is noncontributory.   Physical Exam: BP (!) 149/91 (BP Location: Left Arm, Patient Position: Sitting, Cuff Size: Normal) Comment: recheck 175/98  Pulse 93   Temp 97.7 F (36.5 C) (Oral)   Ht '5\' 4"'$  (1.626 m)   Wt 121 lb 4.8 oz (55 kg)   BMI 20.82 kg/m  GENERAL: The patient is AO x3, in no acute distress. HEENT: Head is normocephalic and atraumatic. EOMI are intact. Mouth is well hydrated and without lesions. NECK: Supple. No masses LUNGS: Clear to auscultation. No presence of rhonchi/wheezing/rales. Adequate chest expansion HEART: RRR, normal s1 and s2. ABDOMEN: tender to palpation in the right flank and RLQ,  no guarding, no peritoneal signs, and nondistended. BS +. No masses. EXTREMITIES: Without any cyanosis, clubbing, rash, lesions or edema. NEUROLOGIC: AOx3, no focal motor deficit. SKIN: no jaundice, no rashes  Imaging/Labs: as above  I personally reviewed and interpreted the available labs, imaging and endoscopic files.  Impression and Plan: AZRIEL DANCY is a 56 y.o. 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  The patient has presented new onset of right flank pain along with changes in his bowel movements which could be secondary to an acute process such as a gastrointestinal infection.  Due to this, we will check stool testing to rule out ongoing infection but given her ongoing concerns regarding the abdominal pain, we will schedule a CT of the abdomen pelvis with IV contrast.  She can take Bentyl as needed for now to relieve her symptoms.  Patient was due for repeat EGD to assess mucosal healing in the upper GI tract which will be  rescheduled.  She is also due for colorectal cancer screening with a colonoscopy which will be performed on the same day.  I advised her to avoid any NSAIDs or high-dose aspirin but it was emphasized the importance of complete smoking cessation.  The patient was found to have elevated blood pressure when vital signs were checked in the office. The blood pressure was rechecked by the nursing staff and it was found be persistently elevated >140/90 mmHg. I personally advised to the patient to follow up closely with PCP for hypertension control.  -Schedule CT abdomen/pelvis with IV contrast -Check C. Diff, GI path in stool -Schedule EGD and colonoscopy in 6 weeks -Stop Carafate for now -Continue lansoprazole 30 mg twice a day -Bentyl as needed for abdominal pain -Smoking cessation is imperative  All questions were answered.      Maylon Peppers, MD Gastroenterology and Hepatology [facility]

## 2022-09-09 NOTE — Telephone Encounter (Signed)
Discussed with patient and she verbalized understanding.  

## 2022-09-09 NOTE — Telephone Encounter (Signed)
Called patient to clarify which med she is talking about and she said to disregard message.

## 2022-09-09 NOTE — Telephone Encounter (Signed)
Patient seen today and called back to ask if she could having something to take for pain. She has tramadol for fibromyalgia pain but reports it is not helping abdominal pain and she not able to sleep at night. Please advise. CT scan is scheduled for tomorrow 5pm.   [phone]

## 2022-09-09 NOTE — Telephone Encounter (Signed)
We discussed in the clinic to take her Bentyl as needed, please ask her to do so. If taking it and no improvement, then we can try something else

## 2022-09-09 NOTE — Patient Instructions (Addendum)
Schedule CT abdomen/pelvis with IV contrast Check C. Diff, GI path in stool Schedule EGD and colonoscopy in 6 weeks Stop Carafate for now Continue lansoprazole 30 mg twice a day Smoking cessation is imperative  The patient was found to have elevated blood pressure when vital signs were checked in the office. The blood pressure was rechecked by the nursing staff and it was found be persistently elevated >140/90 mmHg. I personally advised to the patient to follow up closely with PCP for hypertension control.

## 2022-09-09 NOTE — Telephone Encounter (Signed)
PA approved for CT A/P via [place].   Authorization Number: [id number] Review Date: 09/09/2022 11:22:00 AM Expiration Date: 10/24/2022 Status: Your case has been Approved.

## 2022-09-10 ENCOUNTER — Encounter (INDEPENDENT_AMBULATORY_CARE_PROVIDER_SITE_OTHER): Payer: Self-pay | Admitting: *Deleted

## 2022-09-10 ENCOUNTER — Ambulatory Visit (HOSPITAL_COMMUNITY): Payer: Medicaid Other

## 2022-09-11 ENCOUNTER — Ambulatory Visit (HOSPITAL_COMMUNITY)
Admission: RE | Admit: 2022-09-11 | Discharge: 2022-09-11 | Disposition: A | Discharge status: Home / Self Care | Payer: Medicaid Other | Source: Ambulatory Visit | Attending: Gastroenterology | Admitting: Gastroenterology

## 2022-09-11 DIAGNOSIS — R1031 Right lower quadrant pain: Secondary | ICD-10-CM | POA: Diagnosis present

## 2022-09-11 MED ORDER — IOHEXOL 300 MG/ML  SOLN
100.0000 mL | Freq: Once | INTRAMUSCULAR | Status: AC | PRN
  Administered 2022-09-11: 100 mL via INTRAVENOUS

## 2022-09-12 ENCOUNTER — Telehealth (INDEPENDENT_AMBULATORY_CARE_PROVIDER_SITE_OTHER): Payer: Self-pay

## 2022-09-12 NOTE — Telephone Encounter (Signed)
Patient called back today asking if you would give her some tramadol for the RUQ pain. She says she was told to take Hyoscyamine prn,but she is not having cramping or spasms this is pain she says. She also says she is currently taking tramadol for another heath issue and wanted to know if you could send in more. I advised that we do not call in controlled substances and the if she thought she needed something more she could try reaching out to the pcp that gave her the tramadol to begin with. Patient states understanding.

## 2022-09-12 NOTE — Telephone Encounter (Signed)
Agree, please also let her know hyoscyamine works for abdominal pain, not only spasms so she can try this medication as indicated if she has abdominal pain

## 2022-09-12 NOTE — Telephone Encounter (Signed)
I called and left a detailed message that the Hyoscyamine helps with pain also, to try this.

## 2022-09-13 ENCOUNTER — Encounter (INDEPENDENT_AMBULATORY_CARE_PROVIDER_SITE_OTHER): Payer: Self-pay

## 2022-09-13 ENCOUNTER — Encounter (INDEPENDENT_AMBULATORY_CARE_PROVIDER_SITE_OTHER): Payer: Self-pay | Admitting: Gastroenterology

## 2022-09-18 ENCOUNTER — Other Ambulatory Visit (INDEPENDENT_AMBULATORY_CARE_PROVIDER_SITE_OTHER): Payer: Self-pay | Admitting: Gastroenterology

## 2022-09-18 DIAGNOSIS — K279 Peptic ulcer, site unspecified, unspecified as acute or chronic, without hemorrhage or perforation: Secondary | ICD-10-CM

## 2022-09-18 MED ORDER — SUCRALFATE 1 GM/10ML PO SUSP: 1.0000 g | Freq: Four times a day (QID) | ORAL | 1 refills | Status: DC

## 2022-09-19 ENCOUNTER — Telehealth: Payer: Self-pay | Admitting: *Deleted

## 2022-09-19 MED ORDER — CLENPIQ 10-3.5-12 MG-GM -GM/175ML PO SOLN: 1.0000 | ORAL | 0 refills | Status: DC

## 2022-09-19 NOTE — Telephone Encounter (Signed)
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

## 2022-09-19 NOTE — Telephone Encounter (Signed)
PA done via [facility]. Pending review. [id number]

## 2022-09-19 NOTE — Telephone Encounter (Signed)
Noted and sent the message to Dr. Jenetta Downer attached to the Ct scan.

## 2022-09-25 ENCOUNTER — Encounter (INDEPENDENT_AMBULATORY_CARE_PROVIDER_SITE_OTHER): Payer: Self-pay | Admitting: Gastroenterology

## 2022-09-25 NOTE — Telephone Encounter (Signed)
PA approved 10/09/22-01/07/23

## 2022-09-29 ENCOUNTER — Encounter (INDEPENDENT_AMBULATORY_CARE_PROVIDER_SITE_OTHER): Payer: Self-pay

## 2022-10-01 ENCOUNTER — Encounter (INDEPENDENT_AMBULATORY_CARE_PROVIDER_SITE_OTHER): Payer: Self-pay | Admitting: Gastroenterology

## 2022-10-06 ENCOUNTER — Encounter (INDEPENDENT_AMBULATORY_CARE_PROVIDER_SITE_OTHER): Payer: Self-pay | Admitting: Gastroenterology

## 2022-10-07 NOTE — Telephone Encounter (Signed)
Patient returned call

## 2022-10-08 NOTE — Telephone Encounter (Signed)
Patient also left a voicemail for me to call her and I did. She wanted to know the results of stool test. I told her one was still pending and the other one is not signed off on yet. She is going for egd and tCs tomorrow and I told her to talk with provider tomorrow about results.

## 2022-10-09 ENCOUNTER — Ambulatory Visit (HOSPITAL_COMMUNITY): Admission: RE | Admit: 2022-10-09 | Payer: Medicaid Other | Source: Ambulatory Visit | Admitting: Gastroenterology

## 2022-10-09 ENCOUNTER — Other Ambulatory Visit (INDEPENDENT_AMBULATORY_CARE_PROVIDER_SITE_OTHER): Payer: Self-pay | Admitting: Gastroenterology

## 2022-10-09 ENCOUNTER — Encounter (HOSPITAL_COMMUNITY): Admission: RE | Payer: Self-pay | Source: Ambulatory Visit

## 2022-10-09 ENCOUNTER — Other Ambulatory Visit: Payer: Self-pay | Admitting: *Deleted

## 2022-10-09 ENCOUNTER — Telehealth: Payer: Self-pay | Admitting: *Deleted

## 2022-10-09 DIAGNOSIS — R109 Unspecified abdominal pain: Secondary | ICD-10-CM

## 2022-10-09 DIAGNOSIS — K2970 Gastritis, unspecified, without bleeding: Secondary | ICD-10-CM

## 2022-10-09 DIAGNOSIS — R197 Diarrhea, unspecified: Secondary | ICD-10-CM

## 2022-10-09 LAB — C. DIFFICILE GDH AND TOXIN A/B
GDH ANTIGEN: NOT DETECTED
MICRO NUMBER:: 14427179
SPECIMEN QUALITY:: ADEQUATE
TOXIN A AND B: NOT DETECTED

## 2022-10-09 LAB — GASTROINTESTINAL PATHOGEN PNL

## 2022-10-09 SURGERY — COLONOSCOPY WITH PROPOFOL
Anesthesia: Monitor Anesthesia Care

## 2022-10-09 MED ORDER — DEXLANSOPRAZOLE 60 MG PO CPDR: 60.0000 mg | DELAYED_RELEASE_CAPSULE | Freq: Every day | ORAL | 3 refills | Status: DC

## 2022-10-09 NOTE — Telephone Encounter (Signed)
Please inform her the C. Diff test came back negative, will need to repeat stool culture (to perform it at [place]) Needs to reschedule procedures

## 2022-10-09 NOTE — Telephone Encounter (Signed)
Called pt to reschedule procedure. She did not want to schedule right now. She said she will call back when she looks at her work schedule

## 2022-10-09 NOTE — Telephone Encounter (Signed)
[name], patient wants to reschedule procedures that was canceled for today due to her not being able to do the prep.

## 2022-10-09 NOTE — Telephone Encounter (Signed)
Patient left voicemail at 4:43 am and I called her back when I got to office. She was scheduled for procedures today. She reports her head is hurting and she is having nausea and unable to finish the prep. She was clenpiq and only able to finish half. Mindy canceled her procedure since she is unable to finish and I let patient know her procedure is cancelled. She asked about results of stool test yesterday and I let her know the GI path was canceled. She said her stool was pudding like when she turned it in. The other stool test was back. She is concerned she has a bacterial infection. 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 She then said she has some kind of infection and said she wanted you to admit her to the hospital.

## 2022-10-09 NOTE — Telephone Encounter (Signed)
Unfortunately we need to proceed with her endoscopic procedures to further evaluate this.  The stool test was processed forC . Diff came back negative, so far nothing supports a gastrointestinal infection. Can send a stool culture ([facility]) to have this checked again as her previous test was canceled.  She can go to the ER and be evaluated, they will determine if she needs admission to the hospital, so far I cannot recommend this.

## 2022-10-09 NOTE — Telephone Encounter (Signed)
Tried to call patient and no answer. I left on her vm that I would send her a mychart message and I copied and pasted the message and sent to her mychart.

## 2022-10-09 NOTE — Telephone Encounter (Signed)
HI Arbie Cookey, I tried to call you to discuss Dr. Jenetta Downer recommendations. Please see below:  Unfortunately we need to proceed with her endoscopic procedures to further evaluate this.  The stool test was processed forC . Diff came back negative, so far nothing supports a gastrointestinal infection. Can send a stool culture ([facility]) to have this checked again as her previous test was canceled.  She can go to the ER and be evaluated, they will determine if she needs admission to the hospital, so far I cannot recommend this.   Destine, Let us know when you are ready to reschedule procedure. I will put in orders for stool test to be done at [facility] on [address]. Please make sure the stool specimen is loose/watery or they will not process. Thanks Abigail Butts

## 2022-10-14 NOTE — Telephone Encounter (Signed)
I called patient and discussed her concerns about her stool sample she collected. She collected a sample at home in her own container and asked if that would be ok. I told her she probably needs the sterile container from the lab and to go to [facility] and pick up and ask the lab tech. She reports she will do that. She then said something needs to be done because she will lose her job. I told her you wanted her to get procedure done and she said there is no way she can do the prep. That everytime in the past she has had to be admitted to the hospital to get it done because she gets dehydrated.

## 2022-10-16 ENCOUNTER — Telehealth: Payer: Self-pay | Admitting: Internal Medicine

## 2022-10-16 NOTE — Telephone Encounter (Signed)
She has seen a gastroenterologist within the [facility] system within the past 5 weeks and the appropriate workup has been recommended and the plan is in place We appreciate the interest in our practice, however at this time due to high demand from patients without established GI providers we cannot accommodate this transfer.

## 2022-10-16 NOTE — Telephone Encounter (Signed)
Good Morning Dr. Hilarie Fredrickson,  Supervising Provider 1/17 PM  I have received records for this patient from [facility], she is wanting to transfer here due to the fact she is very sick and they have not been able to help her. States she has severe diarrhea and abdominal pain to where she can't leave the house. I spoke with patient's sister and she states they are willing to see an APP if needed. I will be sending some records up to your office and you also have the ones to review in Epic. Please review at your earliest convenience and advise on scheduling.  Thank You!

## 2022-10-17 ENCOUNTER — Encounter: Payer: Self-pay | Admitting: *Deleted

## 2022-10-17 NOTE — Telephone Encounter (Signed)
Patient advised.

## 2022-10-18 LAB — GI PROFILE, STOOL, PCR

## 2022-10-18 NOTE — Telephone Encounter (Signed)
Please see message Dr. Jenetta Downer. I see a note where she called [facility] to transfer care "she is wanting to transfer here due to the fact she is very sick and they have not been able to help her." But looks like they denied to see her as a patient.

## 2022-10-18 NOTE — Telephone Encounter (Signed)
PA still valid auth# [id number]  DOS 10/09/22-01/07/23

## 2022-10-23 ENCOUNTER — Encounter (HOSPITAL_COMMUNITY)
Admission: RE | Admit: 2022-10-23 | Discharge: 2022-10-23 | Disposition: A | Discharge status: Home / Self Care | Payer: Medicaid Other | Source: Ambulatory Visit | Attending: Gastroenterology | Admitting: Gastroenterology

## 2022-10-23 HISTORY — DX: Cyst of kidney, acquired: N28.1

## 2022-10-24 ENCOUNTER — Encounter (HOSPITAL_COMMUNITY): Payer: Self-pay

## 2022-10-25 ENCOUNTER — Ambulatory Visit (HOSPITAL_COMMUNITY): Payer: Medicaid Other | Admitting: Anesthesiology

## 2022-10-25 ENCOUNTER — Ambulatory Visit (HOSPITAL_COMMUNITY)
Admission: RE | Admit: 2022-10-25 | Discharge: 2022-10-25 | Disposition: A | Discharge status: Home / Self Care | Payer: Medicaid Other | Attending: Gastroenterology | Admitting: Gastroenterology

## 2022-10-25 ENCOUNTER — Encounter (INDEPENDENT_AMBULATORY_CARE_PROVIDER_SITE_OTHER): Payer: Self-pay | Admitting: Gastroenterology

## 2022-10-25 ENCOUNTER — Encounter (HOSPITAL_COMMUNITY)
Admission: RE | Disposition: A | Discharge status: Home / Self Care | Payer: Self-pay | Source: Home / Self Care | Attending: Gastroenterology

## 2022-10-25 DIAGNOSIS — K449 Diaphragmatic hernia without obstruction or gangrene: Secondary | ICD-10-CM

## 2022-10-25 DIAGNOSIS — M797 Fibromyalgia: Secondary | ICD-10-CM | POA: Insufficient documentation

## 2022-10-25 DIAGNOSIS — K648 Other hemorrhoids: Secondary | ICD-10-CM | POA: Insufficient documentation

## 2022-10-25 DIAGNOSIS — F32A Depression, unspecified: Secondary | ICD-10-CM | POA: Insufficient documentation

## 2022-10-25 DIAGNOSIS — F17200 Nicotine dependence, unspecified, uncomplicated: Secondary | ICD-10-CM | POA: Insufficient documentation

## 2022-10-25 DIAGNOSIS — K315 Obstruction of duodenum: Secondary | ICD-10-CM | POA: Diagnosis not present

## 2022-10-25 DIAGNOSIS — I10 Essential (primary) hypertension: Secondary | ICD-10-CM | POA: Insufficient documentation

## 2022-10-25 DIAGNOSIS — Z1211 Encounter for screening for malignant neoplasm of colon: Secondary | ICD-10-CM

## 2022-10-25 DIAGNOSIS — Z79899 Other long term (current) drug therapy: Secondary | ICD-10-CM | POA: Diagnosis not present

## 2022-10-25 DIAGNOSIS — K259 Gastric ulcer, unspecified as acute or chronic, without hemorrhage or perforation: Secondary | ICD-10-CM | POA: Diagnosis not present

## 2022-10-25 DIAGNOSIS — K279 Peptic ulcer, site unspecified, unspecified as acute or chronic, without hemorrhage or perforation: Secondary | ICD-10-CM

## 2022-10-25 DIAGNOSIS — K52831 Collagenous colitis: Secondary | ICD-10-CM | POA: Diagnosis not present

## 2022-10-25 DIAGNOSIS — R197 Diarrhea, unspecified: Secondary | ICD-10-CM | POA: Diagnosis present

## 2022-10-25 DIAGNOSIS — K219 Gastro-esophageal reflux disease without esophagitis: Secondary | ICD-10-CM | POA: Insufficient documentation

## 2022-10-25 DIAGNOSIS — K5710 Diverticulosis of small intestine without perforation or abscess without bleeding: Secondary | ICD-10-CM | POA: Insufficient documentation

## 2022-10-25 DIAGNOSIS — R1013 Epigastric pain: Secondary | ICD-10-CM

## 2022-10-25 DIAGNOSIS — F419 Anxiety disorder, unspecified: Secondary | ICD-10-CM | POA: Diagnosis not present

## 2022-10-25 DIAGNOSIS — K269 Duodenal ulcer, unspecified as acute or chronic, without hemorrhage or perforation: Secondary | ICD-10-CM | POA: Diagnosis not present

## 2022-10-25 HISTORY — PX: BIOPSY: SHX5522

## 2022-10-25 HISTORY — PX: ESOPHAGOGASTRODUODENOSCOPY (EGD) WITH PROPOFOL: SHX5813

## 2022-10-25 HISTORY — PX: COLONOSCOPY WITH PROPOFOL: SHX5780

## 2022-10-25 LAB — HM COLONOSCOPY

## 2022-10-25 SURGERY — COLONOSCOPY WITH PROPOFOL
Anesthesia: General

## 2022-10-25 MED ORDER — ONDANSETRON HCL 4 MG/2ML IJ SOLN
INTRAMUSCULAR | Status: AC
  Filled 2022-10-25: qty 2

## 2022-10-25 MED ORDER — PROPOFOL 10 MG/ML IV BOLUS
INTRAVENOUS | Status: DC | PRN
  Administered 2022-10-25: 50 mg via INTRAVENOUS

## 2022-10-25 MED ORDER — LACTATED RINGERS IV SOLN: INTRAVENOUS | Status: DC

## 2022-10-25 MED ORDER — ONDANSETRON 4 MG PO TBDP
ORAL_TABLET | ORAL | Status: AC
  Administered 2022-10-25: 4 mg via ORAL
  Filled 2022-10-25: qty 1

## 2022-10-25 MED ORDER — ONDANSETRON 4 MG PO TBDP: 4.0000 mg | ORAL_TABLET | Freq: Once | ORAL | Status: AC

## 2022-10-25 MED ORDER — LIDOCAINE 2% (20 MG/ML) 5 ML SYRINGE
INTRAMUSCULAR | Status: DC | PRN
  Administered 2022-10-25: 50 mg via INTRAVENOUS

## 2022-10-25 MED ORDER — PHENYLEPHRINE 80 MCG/ML (10ML) SYRINGE FOR IV PUSH (FOR BLOOD PRESSURE SUPPORT)
PREFILLED_SYRINGE | INTRAVENOUS | Status: DC | PRN
  Administered 2022-10-25: 80 ug via INTRAVENOUS

## 2022-10-25 MED ORDER — PROPOFOL 500 MG/50ML IV EMUL
INTRAVENOUS | Status: DC | PRN
  Administered 2022-10-25: 175 ug/kg/min via INTRAVENOUS

## 2022-10-25 NOTE — Anesthesia Preprocedure Evaluation (Signed)
Anesthesia Evaluation  Patient identified by MRN, date of birth, ID band Patient awake    Reviewed: Allergy & Precautions, H&P , NPO status , Patient's Chart, lab work & pertinent test results  Airway Mallampati: II  TM Distance: >3 FB Neck ROM: Full    Dental  (+) Dental Advisory Given, Caps, Missing   Pulmonary neg pulmonary ROS, Current Smoker and Patient abstained from smoking.   Pulmonary exam normal breath sounds clear to auscultation       Cardiovascular hypertension, Pt. on medications Normal cardiovascular exam Rhythm:Regular Rate:Normal     Neuro/Psych  PSYCHIATRIC DISORDERS Anxiety Depression     Neuromuscular disease    GI/Hepatic Neg liver ROS, PUD,GERD  Medicated,,  Endo/Other  negative endocrine ROS    Renal/GU Renal disease  negative genitourinary   Musculoskeletal  (+)  Fibromyalgia -, narcotic dependent  Abdominal   Peds negative pediatric ROS (+)  Hematology negative hematology ROS (+)   Anesthesia Other Findings   Reproductive/Obstetrics negative OB ROS                             Anesthesia Physical Anesthesia Plan  ASA: 2  Anesthesia Plan: General   Post-op Pain Management: Minimal or no pain anticipated   Induction: Intravenous  PONV Risk Score and Plan: 1 and Propofol infusion  Airway Management Planned: Nasal Cannula and Natural Airway  Additional Equipment:   Intra-op Plan:   Post-operative Plan:   Informed Consent: I have reviewed the patients History and Physical, chart, labs and discussed the procedure including the risks, benefits and alternatives for the proposed anesthesia with the patient or authorized representative who has indicated his/her understanding and acceptance.     Dental advisory given  Plan Discussed with: CRNA and Surgeon  Anesthesia Plan Comments:        Anesthesia Quick Evaluation

## 2022-10-25 NOTE — Op Note (Signed)
[facility] Patient Name: [name] Procedure Date: 10/25/2022 9:41 AM MRN: [id number] Date of Birth: Aug 09, 1966 Attending MD: Maylon Peppers , , [id number] CSN: [id number] Age: 57 Admit Type: Outpatient Procedure:                Colonoscopy Indications:              Clinically significant diarrhea of unexplained                            origin Providers:                Maylon Peppers, Caprice Kluver, [place], Technician Referring MD:              Medicines:                Monitored Anesthesia Care Complications:            No immediate complications. Estimated Blood Loss:     Estimated blood loss: none. Procedure:                Pre-Anesthesia Assessment:                           - Prior to the procedure, a History and Physical                            was performed, and patient medications, allergies                            and sensitivities were reviewed. The patient's                            tolerance of previous anesthesia was reviewed.                           - The risks and benefits of the procedure and the                            sedation options and risks were discussed with the                            patient. All questions were answered and informed                            consent was obtained.                           - ASA Grade Assessment: III - A patient with severe                            systemic disease.                           After obtaining informed consent, the colonoscope  was passed under direct vision. Throughout the                            procedure, the patient's blood pressure, pulse, and                            oxygen saturations were monitored continuously. The                            [id number]-HQ190L ([id number]) scope was introduced through                            the anus and advanced to the the cecum, identified                            by  appendiceal orifice and ileocecal valve. The                            colonoscopy was performed without difficulty. The                            patient tolerated the procedure well. The quality                            of the bowel preparation was adequate. Scope In: 10:11:32 AM Scope Out: 10:36:10 AM Scope Withdrawal Time: 0 hours 16 minutes 15 seconds  Total Procedure Duration: 0 hours 24 minutes 38 seconds  Findings:      The perianal and digital rectal examinations were normal.      The colon (entire examined portion) appeared normal. Biopsies for       histology were taken with a cold forceps from the right colon and left       colon for evaluation of microscopic colitis.      Non-bleeding internal hemorrhoids were found during retroflexion. The       hemorrhoids were small. Impression:               - The entire examined colon is normal. Biopsied.                           - Non-bleeding internal hemorrhoids. Moderate Sedation:      Per Anesthesia Care Recommendation:           - Discharge patient to home (ambulatory).                           - Resume previous diet.                           - Await pathology results.                           - Repeat colonoscopy in 10 years for screening                            purposes. Procedure Code(s):        --- Professional ---  45380, Colonoscopy, flexible; with biopsy, single                            or multiple Diagnosis Code(s):        --- Professional ---                           K64.8, Other hemorrhoids                           R19.7, Diarrhea, unspecified CPT copyright 2022 American [employer] Association. All rights reserved. The codes documented in this report are preliminary and upon coder review may  be revised to meet current compliance requirements. Maylon Peppers, MD Maylon Peppers,  10/25/2022 10:45:13 AM This report has been signed electronically. Number of Addenda: 0

## 2022-10-25 NOTE — Op Note (Signed)
[facility] Patient Name: [name] Procedure Date: 10/25/2022 9:42 AM MRN: [id number] Date of Birth: 16-Jun-1966 Attending MD: Maylon Peppers , , [id number] CSN: [id number] Age: 57 Admit Type: Outpatient Procedure:                Upper GI endoscopy Indications:              Epigastric abdominal pain, Follow-up of peptic ulcer Providers:                Maylon Peppers, Caprice Kluver, Ladoris Gene                            Technician, Technician Referring MD:              Medicines:                Monitored Anesthesia Care Complications:            No immediate complications. Estimated Blood Loss:     Estimated blood loss: none. Procedure:                Pre-Anesthesia Assessment:                           - Prior to the procedure, a History and Physical                            was performed, and patient medications, allergies                            and sensitivities were reviewed. The patient's                            tolerance of previous anesthesia was reviewed.                           - The risks and benefits of the procedure and the                            sedation options and risks were discussed with the                            patient. All questions were answered and informed                            consent was obtained.                           - ASA Grade Assessment: III - A patient with severe                            systemic disease.                           After obtaining informed consent, the endoscope was                            passed under direct vision. Throughout  the                            procedure, the patient's blood pressure, pulse, and                            oxygen saturations were monitored continuously. The                            [id number]-[id number] ([id number]) scope was introduced through the                            mouth, and advanced to the second part of duodenum.                            The upper GI endoscopy was  accomplished without                            difficulty. The patient tolerated the procedure                            well. Scope In: 9:55:49 AM Scope Out: 10:05:26 AM Total Procedure Duration: 0 hours 9 minutes 37 seconds  Findings:      A 1 cm hiatal hernia was present.      The gastroesophageal flap valve was visualized endoscopically and       classified as Hill Grade III (minimal fold, loose to endoscope, hiatal       hernia likely).      One non-bleeding cratered gastric ulcer with no stigmata of bleeding was       found in the gastric antrum. The lesion was 10 mm in largest dimension.       This was similar in size to previous ulcer on 11/2021. Biopsies were       taken with a cold forceps for Helicobacter pylori testing.      Two non-bleeding cratered duodenal ulcers with a clean ulcer base       (Forrest Class III) were found in the first portion of the duodenum. The       largest lesion was 15 mm in largest dimension. There was mild scope       assoiated trauma when scope was advanced.      An acquired benign-appearing, intrinsic mild stenosis was found in the       first portion of the duodenum and was traversed.      A small non-bleeding diverticulum was found in the second portion of the       duodenum.      The exam of the duodenum was otherwise normal. Impression:               - 1 cm hiatal hernia.                           - Gastroesophageal flap valve classified as Hill                            Grade III (minimal fold, loose to endoscope, hiatal  hernia likely).                           - Non-bleeding gastric ulcer with no stigmata of                            bleeding. Biopsied.                           - Non-bleeding duodenal ulcers with a clean ulcer                            base (Forrest Class III).                           - Acquired duodenal stenosis.                           - Non-bleeding duodenal  diverticulum. Moderate Sedation:      Per Anesthesia Care Recommendation:           - Discharge patient to home (ambulatory).                           - Resume previous diet.                           - Continue present medications.                           - For Dexilant, should open capsule and swallow                            granules.                           - Repeat upper endoscopy in 3 months for                            surveillance.                           - Await pathology results.                           - Strongly advised to quit smoking                           - No Goody powders/BC powders, ibuprofen, naproxen,                            or other non-steroidal anti-inflammatory drugs. Procedure Code(s):        --- Professional ---                           [phone], Esophagogastroduodenoscopy, flexible,                            transoral; with biopsy,  single or multiple Diagnosis Code(s):        --- Professional ---                           K44.9, Diaphragmatic hernia without obstruction or                            gangrene                           K25.9, Gastric ulcer, unspecified as acute or                            chronic, without hemorrhage or perforation                           K26.9, Duodenal ulcer, unspecified as acute or                            chronic, without hemorrhage or perforation                           K31.5, Obstruction of duodenum                           R10.13, Epigastric pain                           K27.9, Peptic ulcer, site unspecified, unspecified                            as acute or chronic, without hemorrhage or                            perforation                           K57.10, Diverticulosis of small intestine without                            perforation or abscess without bleeding CPT copyright 2022 American Medical Association. All rights reserved. The codes documented in this report are preliminary and  upon coder review may  be revised to meet current compliance requirements. Maylon Peppers, MD Maylon Peppers,  10/25/2022 10:42:41 AM This report has been signed electronically. Number of Addenda: 0

## 2022-10-25 NOTE — H&P (Signed)
[name] is an 57 y.o. female.   Chief Complaint: Abdominal pain and diarrhea HPI: [name] is a 57 y.o. 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   Patient reports that her abdominal pain has significantly improved after she started taking lansoprazole twice a day and Bentyl as needed.  Diarrhea has slowly improved but she is still having 3-4 watery bowel movements per day without melena or hematochezia. The patient denies having any nausea, vomiting, fever, chills, hematochezia, melena, hematemesis, abdominal distention, jaundice, pruritus or weight loss.   Past Medical History:  Diagnosis Date   Anxiety    Cyst of right kidney    Depression    Essential hypertension, benign 12/26/2016   Fibroadenoma of right breast    S/P LUMPECTOMY W/ RADIOACTIVE SEED IMPLANT 05-19-2015   Fibromyalgia    GERD (gastroesophageal reflux disease)    History of cervical dysplasia    s/p conzation and recurrent s/p  vaginal hysterectomy 2008   History of duodenal ulcer    1992--  S/P  REPAIR PERFORATED ULCER   History of gastric ulcer    age 57   Hypertension    IBS (irritable bowel syndrome)    Vaginal Pap smear, abnormal    VIN II (vulvar intraepithelial neoplasia II)    Wears glasses    Wears partial dentures    lower    Past Surgical History:  Procedure Laterality Date   BALLOON DILATION  12/21/2021   Procedure: BALLOON DILATION;  Surgeon: Harvel Quale, MD;  [facility];  Service: Gastroenterology;;   BIOPSY  12/19/2021   Procedure: BIOPSY;  Surgeon: Harvel Quale, MD;  [facility]: [facility];  Service: Gastroenterology;;   BLADDER SLING PROCEDURE  2005  approx.   CERVICAL CONIZATION W/BX  2006  approx   CO2 LASER APPLICATION N/A 0/94/7096   Procedure: CO2 LASER OF THE  VAGINA;  Surgeon: Everitt Amber, MD;  Location: [facility];  Service: Gynecology;  Laterality: N/A;   ESOPHAGEAL DILATION N/A 06/14/2020   Procedure: ESOPHAGEAL DILATION;  Surgeon: Rogene Houston, MD;  [facility]: [facility];  Service: Endoscopy;  Laterality: N/A;   ESOPHAGEAL DILATION N/A 10/04/2020   Procedure: ESOPHAGEAL DILATION;  Surgeon: Rogene Houston, MD;  [facility];  Service: Endoscopy;  Laterality: N/A;   ESOPHAGOGASTRODUODENOSCOPY N/A 01/21/2017   Procedure: ESOPHAGOGASTRODUODENOSCOPY (EGD);  Surgeon: Rogene Houston, MD;  [facility];  Service: Endoscopy;  Laterality: N/A;   ESOPHAGOGASTRODUODENOSCOPY N/A 07/17/2017   Procedure: ESOPHAGOGASTRODUODENOSCOPY (EGD);  Surgeon: Rogene Houston, MD;  [facility];  Service: Endoscopy;  Laterality: N/A;  1:00   ESOPHAGOGASTRODUODENOSCOPY N/A 10/16/2017   Procedure: ESOPHAGOGASTRODUODENOSCOPY (EGD)WITH DILATION;  Surgeon: Rogene Houston, MD;  [facility];  Service: Endoscopy;  Laterality: N/A;  1030   ESOPHAGOGASTRODUODENOSCOPY (EGD) WITH PROPOFOL N/A 01/23/2017   Procedure: ESOPHAGOGASTRODUODENOSCOPY (EGD) WITH PROPOFOL;  Surgeon: Rogene Houston, MD;  [facility];  Service: Endoscopy;  Laterality: N/A;  duodenal striture dilation   ESOPHAGOGASTRODUODENOSCOPY (EGD) WITH PROPOFOL N/A 06/14/2020   Procedure: ESOPHAGOGASTRODUODENOSCOPY (EGD) WITH PROPOFOL;  Surgeon: Rogene Houston, MD;  [facility];  Service: Endoscopy;  Laterality: N/A;  1055   ESOPHAGOGASTRODUODENOSCOPY (EGD) WITH PROPOFOL N/A 10/04/2020   Procedure: ESOPHAGOGASTRODUODENOSCOPY (EGD) WITH PROPOFOL;  Surgeon: Rogene Houston, MD;  [facility];  Service: Endoscopy;  Laterality: N/A;  1:00   ESOPHAGOGASTRODUODENOSCOPY (EGD) WITH PROPOFOL N/A 12/19/2021   Procedure: ESOPHAGOGASTRODUODENOSCOPY (EGD) WITH PROPOFOL;  Surgeon: Harvel Quale, MD;  [facility];   Service: Gastroenterology;  Laterality: N/A;  815   ESOPHAGOGASTRODUODENOSCOPY (EGD) WITH PROPOFOL N/A 12/21/2021   Procedure: ESOPHAGOGASTRODUODENOSCOPY (EGD) WITH PROPOFOL;  Surgeon: Harvel Quale, MD;  [facility]: [facility];  Service: Gastroenterology;  Laterality: N/A;  205 ASA 1   LAPAROSCOPY N/A 12/19/2017   Procedure: LAPAROSCOPY, PYLOROPLASTY WITH Silvestre Gunner;  Surgeon: Johnathan Hausen, MD;  Location: [facility];  Service: General;  Laterality: N/A;   NASAL SEPTOPLASTY W/ TURBINOPLASTY Bilateral 04/30/2019   Procedure: BILATERAL NASAL SEPTOPLASTY WITH TURBINATE REDUCTION;  Surgeon: Leta Baptist, MD;  Location: [facility];  Service: ENT;  Laterality: Bilateral;   RADIOACTIVE SEED GUIDED EXCISIONAL BREAST BIOPSY Right 05/19/2015   Procedure: EXCISIONAL RIGHT BREAST MASS WITH RADIOACTIVE SEED LOCALIZATION;  Surgeon: Fanny Skates, MD;  Location: [place];  Service: General;  Laterality: Right;   REPAIR OF PERFORATED ULCER  1992   duodenal   VAGINAL HYSTERECTOMY  11/13/2006    Family History  Problem Relation Age of Onset   Clotting disorder Father    Diabetes Father    Hypertension Father    Hypertension Mother    Kidney failure Mother    Hypertension Brother    Liver cancer Brother    Other Brother        cyst on kidney   Hypertension Sister    Hypertension Maternal Aunt    Hypertension Maternal Uncle    Hypertension Paternal Aunt    Hypertension Paternal Uncle    Social History:  reports that she has been smoking cigarettes. She has a 18.00 pack-year smoking history. She has been exposed to tobacco smoke. She has never used smokeless tobacco. She reports that she does not drink alcohol and does not use drugs.  Allergies:  Allergies  Allergen Reactions   Codeine Nausea Only   Hydrocodone Nausea Only    feels hot and insomia   Sulfa Antibiotics Hives   Penicillin V Nausea Only    Medications Prior to Admission  Medication Sig  Dispense Refill   ALPRAZolam (XANAX) 0.25 MG tablet Take 0.25 mg by mouth 2 (two) times daily as needed for anxiety.     dexlansoprazole (DEXILANT) 60 MG capsule Take 1 capsule (60 mg total) by mouth daily. 90 capsule 3   estradiol (VIVELLE-DOT) 0.1 MG/24HR patch Place 1 patch (0.1 mg total) onto the skin 2 (two) times a week. 8 patch PRN   hyoscyamine (LEVSIN SL) 0.125 MG SL tablet Place 1 tablet (0.125 mg total) under the tongue every 6 (six) hours as needed (abdominal pain). 30 tablet 0   potassium chloride (MICRO-K) 10 MEQ CR capsule Take 10 mEq by mouth daily.     sucralfate (CARAFATE) 1 GM/10ML suspension Take 10 mLs (1 g total) by mouth 4 (four) times daily. 420 mL 1   traMADol (ULTRAM) 50 MG tablet Take 50 mg by mouth every 6 (six) hours as needed for moderate pain.      alum & mag hydroxide-simeth (MAALOX/MYLANTA) 200-200-20 MG/5ML suspension Take 15 mLs by mouth every 6 (six) hours as needed for indigestion or heartburn.     amLODipine (NORVASC) 10 MG tablet Take 10 mg by mouth daily as needed (High blood pressure).     fluticasone (FLONASE) 50 MCG/ACT nasal spray Place 2 sprays  into both nostrils daily as needed for allergies.     Multiple Vitamin (MULTIVITAMIN WITH MINERALS) TABS tablet Take 1 tablet by mouth daily.     Sod Picosulfate-Mag Ox-Cit Acd (CLENPIQ) 10-3.5-12 MG-GM -GM/175ML SOLN Take 1 kit by mouth as directed. 350 mL 0   valACYclovir (VALTREX) 1000 MG tablet Take 1,000 mg by mouth daily as needed (Flair -up).  5    No results found for this or any previous visit (from the past 48 hour(s)). No results found.  Review of Systems  Gastrointestinal:  Positive for abdominal pain and diarrhea.  All other systems reviewed and are negative.   Blood pressure 119/69, pulse 76, temperature 98.1 F (36.7 C), temperature source Oral, resp. rate 18, SpO2 96 %. Physical Exam  GENERAL: The patient is AO x3, in no acute distress. HEENT: Head is normocephalic and atraumatic. EOMI  are intact. Mouth is well hydrated and without lesions. NECK: Supple. No masses LUNGS: Clear to auscultation. No presence of rhonchi/wheezing/rales. Adequate chest expansion HEART: RRR, normal s1 and s2. ABDOMEN: Soft, nontender, no guarding, no peritoneal signs, and nondistended. BS +. No masses. EXTREMITIES: Without any cyanosis, clubbing, rash, lesions or edema. NEUROLOGIC: AOx3, no focal motor deficit. SKIN: no jaundice, no rashes  Assessment/Plan [name] is a 57 y.o. 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  Will proceed with EGD and colonoscopy.  Harvel Quale, MD 10/25/2022, 9:36 AM

## 2022-10-25 NOTE — Transfer of Care (Signed)
Immediate Anesthesia Transfer of Care Note  Patient: [name]  Procedure(s) Performed: COLONOSCOPY WITH PROPOFOL ESOPHAGOGASTRODUODENOSCOPY (EGD) WITH PROPOFOL BIOPSY  Patient Location: PACU  Anesthesia Type:General  Level of Consciousness: awake  Airway & Oxygen Therapy: Patient Spontanous Breathing  Post-op Assessment: Report given to RN and Post -op Vital signs reviewed and stable  Post vital signs: Reviewed and stable  Last Vitals:  Vitals Value Taken Time  BP    Temp    Pulse    Resp    SpO2      Last Pain:  Vitals:   10/25/22 0950  TempSrc:   PainSc: 5       Patients Stated Pain Goal: 6 (20/94/70 9628)  Complications: No notable events documented.

## 2022-10-25 NOTE — Anesthesia Postprocedure Evaluation (Signed)
Anesthesia Post Note  Patient: [name]  Procedure(s) Performed: COLONOSCOPY WITH PROPOFOL ESOPHAGOGASTRODUODENOSCOPY (EGD) WITH PROPOFOL BIOPSY  Patient location during evaluation: Phase II Anesthesia Type: General Level of consciousness: awake and alert and oriented Pain management: pain level controlled Vital Signs Assessment: post-procedure vital signs reviewed and stable Respiratory status: spontaneous breathing, nonlabored ventilation and respiratory function stable Cardiovascular status: blood pressure returned to baseline and stable Postop Assessment: no apparent nausea or vomiting Anesthetic complications: no  No notable events documented.   Last Vitals:  Vitals:   10/25/22 0849 10/25/22 1041  BP: 119/69 (!) 95/54  Pulse: 76 92  Resp: 18 16  Temp: 36.7 C 36.6 C  SpO2: 96% 99%    Last Pain:  Vitals:   10/25/22 1041  TempSrc: Oral  PainSc: Asleep                 Kaveri Perras C Aimar [place]

## 2022-10-25 NOTE — Discharge Instructions (Signed)
You are being discharged to home.  Resume your previous diet.  Continue your present medications.  For Dexilant, should open capsule and swallow granules. Your physician has recommended a repeat upper endoscopy in three months for surveillance.  We are waiting for your pathology results.  Do not take any Goody powders/BC powders,  ibuprofen (including Advil, Motrin or Nuprin), naproxen, or other non-steroidal anti-inflammatory drugs.  Your physician has recommended a repeat colonoscopy in 10 years for screening purposes.

## 2022-10-28 ENCOUNTER — Encounter (INDEPENDENT_AMBULATORY_CARE_PROVIDER_SITE_OTHER): Payer: Self-pay | Admitting: Gastroenterology

## 2022-10-28 ENCOUNTER — Ambulatory Visit (INDEPENDENT_AMBULATORY_CARE_PROVIDER_SITE_OTHER): Payer: Medicaid Other | Admitting: Gastroenterology

## 2022-10-28 ENCOUNTER — Encounter (INDEPENDENT_AMBULATORY_CARE_PROVIDER_SITE_OTHER): Payer: Self-pay | Admitting: *Deleted

## 2022-10-28 ENCOUNTER — Other Ambulatory Visit (INDEPENDENT_AMBULATORY_CARE_PROVIDER_SITE_OTHER): Payer: Self-pay | Admitting: Gastroenterology

## 2022-10-28 DIAGNOSIS — K52831 Collagenous colitis: Secondary | ICD-10-CM

## 2022-10-28 LAB — SURGICAL PATHOLOGY

## 2022-10-28 MED ORDER — BUDESONIDE 3 MG PO CPEP: ORAL_CAPSULE | ORAL | 0 refills | Status: DC

## 2022-11-01 ENCOUNTER — Encounter (HOSPITAL_COMMUNITY): Payer: Self-pay | Admitting: Gastroenterology

## 2022-11-05 ENCOUNTER — Encounter (INDEPENDENT_AMBULATORY_CARE_PROVIDER_SITE_OTHER): Payer: Self-pay | Admitting: Gastroenterology

## 2022-11-05 ENCOUNTER — Other Ambulatory Visit (INDEPENDENT_AMBULATORY_CARE_PROVIDER_SITE_OTHER): Payer: Self-pay | Admitting: Gastroenterology

## 2022-11-05 DIAGNOSIS — R1031 Right lower quadrant pain: Secondary | ICD-10-CM

## 2022-11-05 MED ORDER — HYOSCYAMINE SULFATE 0.125 MG SL SUBL: 0.1250 mg | SUBLINGUAL_TABLET | Freq: Four times a day (QID) | SUBLINGUAL | 2 refills | Status: DC | PRN

## 2022-11-18 ENCOUNTER — Encounter (INDEPENDENT_AMBULATORY_CARE_PROVIDER_SITE_OTHER): Payer: Self-pay | Admitting: Gastroenterology

## 2022-11-25 NOTE — Telephone Encounter (Signed)
Hi Dr. Silverio Decamp, patient's sister Dahlia Byes (currently your patient), called to check status on this Transfer of Care request. Without giving her any patient specific information, she was informed of Dr. Vena Rua recommendation below. She is anxiously requesting for you to review the request as well for the patient is in need of proper GI care and seems to have an intensive medical history along with family hx of colon cancer. They are concerned and not satisfied with the current GI provider. She is asking for your help for her sister to continue her care with [facility].   Please advise,

## 2022-11-27 NOTE — Telephone Encounter (Signed)
Ok to schedule next available appointment with me.

## 2022-12-10 ENCOUNTER — Other Ambulatory Visit (INDEPENDENT_AMBULATORY_CARE_PROVIDER_SITE_OTHER): Payer: Self-pay | Admitting: Gastroenterology

## 2022-12-10 DIAGNOSIS — K279 Peptic ulcer, site unspecified, unspecified as acute or chronic, without hemorrhage or perforation: Secondary | ICD-10-CM

## 2022-12-10 NOTE — Telephone Encounter (Signed)
Last seen 09/09/22

## 2022-12-15 ENCOUNTER — Encounter (INDEPENDENT_AMBULATORY_CARE_PROVIDER_SITE_OTHER): Payer: Self-pay | Admitting: Gastroenterology

## 2022-12-16 ENCOUNTER — Other Ambulatory Visit (INDEPENDENT_AMBULATORY_CARE_PROVIDER_SITE_OTHER): Payer: Self-pay | Admitting: *Deleted

## 2022-12-16 MED ORDER — SUCRALFATE 1 G PO TABS: 1.0000 g | ORAL_TABLET | Freq: Three times a day (TID) | ORAL | 0 refills | Status: DC

## 2023-01-08 ENCOUNTER — Other Ambulatory Visit: Payer: Self-pay | Admitting: Adult Health

## 2023-01-17 ENCOUNTER — Encounter (INDEPENDENT_AMBULATORY_CARE_PROVIDER_SITE_OTHER): Payer: Self-pay | Admitting: *Deleted

## 2023-01-30 ENCOUNTER — Ambulatory Visit (INDEPENDENT_AMBULATORY_CARE_PROVIDER_SITE_OTHER): Payer: Medicaid Other | Admitting: Gastroenterology

## 2023-02-10 ENCOUNTER — Ambulatory Visit: Payer: Medicaid Other | Admitting: Gastroenterology

## 2023-03-02 ENCOUNTER — Other Ambulatory Visit (INDEPENDENT_AMBULATORY_CARE_PROVIDER_SITE_OTHER): Payer: Self-pay | Admitting: Gastroenterology

## 2023-03-20 ENCOUNTER — Encounter (INDEPENDENT_AMBULATORY_CARE_PROVIDER_SITE_OTHER): Payer: Self-pay | Admitting: Gastroenterology

## 2023-03-25 ENCOUNTER — Other Ambulatory Visit (HOSPITAL_COMMUNITY): Payer: Self-pay | Admitting: Family Medicine

## 2023-03-25 DIAGNOSIS — R1031 Right lower quadrant pain: Secondary | ICD-10-CM

## 2023-03-31 ENCOUNTER — Encounter (INDEPENDENT_AMBULATORY_CARE_PROVIDER_SITE_OTHER): Payer: Self-pay | Admitting: Gastroenterology

## 2023-03-31 ENCOUNTER — Ambulatory Visit (HOSPITAL_COMMUNITY): Payer: Medicaid Other

## 2023-03-31 ENCOUNTER — Telehealth (INDEPENDENT_AMBULATORY_CARE_PROVIDER_SITE_OTHER): Payer: Self-pay | Admitting: Gastroenterology

## 2023-03-31 ENCOUNTER — Encounter (HOSPITAL_COMMUNITY): Payer: Self-pay

## 2023-03-31 ENCOUNTER — Ambulatory Visit (INDEPENDENT_AMBULATORY_CARE_PROVIDER_SITE_OTHER): Payer: Medicaid Other | Admitting: Gastroenterology

## 2023-03-31 VITALS — BP 134/82 | HR 106 | Temp 97.5°F | Ht 64.0 in | Wt 113.1 lb

## 2023-03-31 DIAGNOSIS — K257 Chronic gastric ulcer without hemorrhage or perforation: Secondary | ICD-10-CM | POA: Diagnosis not present

## 2023-03-31 DIAGNOSIS — K589 Irritable bowel syndrome without diarrhea: Secondary | ICD-10-CM

## 2023-03-31 DIAGNOSIS — K269 Duodenal ulcer, unspecified as acute or chronic, without hemorrhage or perforation: Secondary | ICD-10-CM

## 2023-03-31 DIAGNOSIS — R1031 Right lower quadrant pain: Secondary | ICD-10-CM

## 2023-03-31 MED ORDER — HYOSCYAMINE SULFATE 0.125 MG SL SUBL: 0.1250 mg | SUBLINGUAL_TABLET | Freq: Three times a day (TID) | SUBLINGUAL | 2 refills | Status: AC | PRN

## 2023-03-31 NOTE — Patient Instructions (Addendum)
Schedule EGD  Smoking cessation is imperative! Continue Dexilant 60 mg qday Take Levsin 1 tablet q8h as needed for abdominal pain Proceed with transvaginal US Finish budesonide taper

## 2023-03-31 NOTE — Telephone Encounter (Signed)
Pt would like to be scheduled in August for EGD. Pt prefers Friday afternoons. Will call once I have August schedule

## 2023-03-31 NOTE — Progress Notes (Signed)
[name], M.D. Gastroenterology & Hepatology [facility]enter[facility] [address] [place], [place] [address]  Primary Care Physician: Ignatius Specking, MD [address] [place] [address]  I will communicate my assessment and recommendations to the referring MD via EMR.  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  The patient was last seen on 09/09/2022. At that time, the patient was scheduled for a CT abdomen and pelvis with IV contrast which was performed on 09/11/2022, which showed presence of wall thickening of the gastric antrum concerning for gastric ulcer and a small perigastric lymph node.  C. difficile and GI pathogen panel were negative on 10/15/2022.  She was continued on lansoprazole 30 mg twice daily for GERD.  EGD and colonoscopy were scheduled with fine described below.  Patient was diagnosed with collagenous colitis and was given a 25-month budesonide taper. She is currently taking budesonide 3 mg qday. She is still smoking 1 pack of cigarettes.  Patient reports that on 03/20/2023 she presented new onset of lower abdominal pain throughout the day. She reports having some mucus in the stool on top of her soft stool. This lasted for a few days and eventually normalized. She was concerned about the change of her bowel movements. She reports that she has had recurrent episodes of pain in the periumbilical area radiating to the RLQ area. She is not taking Levsin as she ran out of the medication.  States her stools have normalized.  The patient denies having any nausea,  vomiting, fever, chills, hematochezia, melena, hematemesis, abdominal distention,diarrhea, jaundice, pruritus or weight loss.  Patient is scheduled to undergo a pelvic ultrasound.  Last EGD: 10/25/2022 1 cm hiatal hernia 110 mm cratered gastric ulcer was found in the gastric antrum (focal erosion and reactive gastropathy without H. pylori or intestinal metaplasia), 2 nonbleeding cratered duodenal ulcers in the first portion of the duodenum with largest measuring 15 mm, presence of a mild stenosis in the first portion of the duodenum, diverticulum in the second portion of the duodenum  Patient was scheduled for repeat EGD in 3 months but this was not performed.  Advised to stop using stimulatory medications and quit smoking.  Last Colonoscopy: 10/25/2022 Normal colon with biopsies consistent with collagenous colitis, internal hemorrhoids. Repeat colonoscopy in 10 years.  Past Medical History: Past Medical History:  Diagnosis Date   Anxiety    Cyst of right kidney    Depression    Essential hypertension, benign 12/26/2016   Fibroadenoma of right breast    S/P LUMPECTOMY W/ RADIOACTIVE SEED IMPLANT 05-19-2015   Fibromyalgia    GERD (gastroesophageal reflux disease)    History of cervical dysplasia    s/p conzation and recurrent s/p  vaginal hysterectomy 2008   History of duodenal ulcer    1992--  S/P  REPAIR PERFORATED ULCER   History of gastric ulcer    age 61   Hypertension    IBS (irritable bowel syndrome)    Vaginal Pap smear, abnormal    VIN II (vulvar intraepithelial neoplasia II)    Wears glasses    Wears partial dentures    lower    Past  Surgical History: Past Surgical History:  Procedure Laterality Date   BALLOON DILATION  12/21/2021   Procedure: BALLOON DILATION;  Surgeon: Dolores Frame, MD;  [facility];  Service: Gastroenterology;;   BIOPSY  12/19/2021   Procedure: BIOPSY;  Surgeon: Dolores Frame, MD;  [facility];   Service: Gastroenterology;;   BIOPSY  10/25/2022   Procedure: BIOPSY;  Surgeon: Dolores Frame, MD;  [facility];  Service: Gastroenterology;;   BLADDER SLING PROCEDURE  2005  approx.   CERVICAL CONIZATION W/BX  2006  approx   CO2 LASER APPLICATION N/A 06/13/2016   Procedure: CO2 LASER OF THE VAGINA;  Surgeon: Adolphus Birchwood, MD;  Location: [facility];  Service: Gynecology;  Laterality: N/A;   COLONOSCOPY WITH PROPOFOL N/A 10/25/2022   Procedure: COLONOSCOPY WITH PROPOFOL;  Surgeon: Dolores Frame, MD;  [facility];  Service: Gastroenterology;  Laterality: N/A;  9:45am, asa 1-2   ESOPHAGEAL DILATION N/A 06/14/2020   Procedure: ESOPHAGEAL DILATION;  Surgeon: Malissa Hippo, MD;  [facility];  Service: Endoscopy;  Laterality: N/A;   ESOPHAGEAL DILATION N/A 10/04/2020   Procedure: ESOPHAGEAL DILATION;  Surgeon: Malissa Hippo, MD;  [facility];  Service: Endoscopy;  Laterality: N/A;   ESOPHAGOGASTRODUODENOSCOPY N/A 01/21/2017   Procedure: ESOPHAGOGASTRODUODENOSCOPY (EGD);  Surgeon: Malissa Hippo, MD;  [facility];  Service: Endoscopy;  Laterality: N/A;   ESOPHAGOGASTRODUODENOSCOPY N/A 07/17/2017   Procedure: ESOPHAGOGASTRODUODENOSCOPY (EGD);  Surgeon: Malissa Hippo, MD;  [facility];  Service: Endoscopy;  Laterality: N/A;  1:00   ESOPHAGOGASTRODUODENOSCOPY N/A 10/16/2017   Procedure: ESOPHAGOGASTRODUODENOSCOPY (EGD)WITH DILATION;  Surgeon: Malissa Hippo, MD;  [facility];  Service: Endoscopy;  Laterality: N/A;  1030   ESOPHAGOGASTRODUODENOSCOPY (EGD) WITH PROPOFOL N/A 01/23/2017   Procedure: ESOPHAGOGASTRODUODENOSCOPY (EGD) WITH PROPOFOL;  Surgeon: Malissa Hippo, MD;  [facility];  Service: Endoscopy;  Laterality: N/A;  duodenal striture dilation   ESOPHAGOGASTRODUODENOSCOPY (EGD) WITH PROPOFOL N/A 06/14/2020   Procedure: ESOPHAGOGASTRODUODENOSCOPY (EGD) WITH  PROPOFOL;  Surgeon: Malissa Hippo, MD;  [facility];  Service: Endoscopy;  Laterality: N/A;  1055   ESOPHAGOGASTRODUODENOSCOPY (EGD) WITH PROPOFOL N/A 10/04/2020   Procedure: ESOPHAGOGASTRODUODENOSCOPY (EGD) WITH PROPOFOL;  Surgeon: Malissa Hippo, MD;  [facility];  Service: Endoscopy;  Laterality: N/A;  1:00   ESOPHAGOGASTRODUODENOSCOPY (EGD) WITH PROPOFOL N/A 12/19/2021   Procedure: ESOPHAGOGASTRODUODENOSCOPY (EGD) WITH PROPOFOL;  Surgeon: Dolores Frame, MD;  [facility];  Service: Gastroenterology;  Laterality: N/A;  815   ESOPHAGOGASTRODUODENOSCOPY (EGD) WITH PROPOFOL N/A 12/21/2021   Procedure: ESOPHAGOGASTRODUODENOSCOPY (EGD) WITH PROPOFOL;  Surgeon: Dolores Frame, MD;  [facility];  Service: Gastroenterology;  Laterality: N/A;  205 ASA 1   ESOPHAGOGASTRODUODENOSCOPY (EGD) WITH PROPOFOL N/A 10/25/2022   Procedure: ESOPHAGOGASTRODUODENOSCOPY (EGD) WITH PROPOFOL;  Surgeon: Dolores Frame, MD;  [facility];  Service: Gastroenterology;  Laterality: N/A;   LAPAROSCOPY N/A 12/19/2017   Procedure: LAPAROSCOPY, PYLOROPLASTY WITH Peggye Ley;  Surgeon: Luretha Murphy, MD;  Location: [facility];  Service: General;  Laterality: N/A;   NASAL SEPTOPLASTY W/ TURBINOPLASTY Bilateral 04/30/2019   Procedure: BILATERAL NASAL SEPTOPLASTY WITH TURBINATE REDUCTION;  Surgeon: Newman Pies, MD;  Location: [facility];  Service: ENT;  Laterality: Bilateral;   RADIOACTIVE SEED GUIDED EXCISIONAL BREAST BIOPSY Right 05/19/2015   Procedure: EXCISIONAL RIGHT BREAST MASS WITH RADIOACTIVE SEED LOCALIZATION;  Surgeon: Mikey Bussing  Derrell Lolling, MD;  Location: [facility];  Service: General;  Laterality: Right;   REPAIR OF PERFORATED ULCER  1992   duodenal   VAGINAL HYSTERECTOMY  11/13/2006    Family History: Family History  Problem Relation Age of Onset   Clotting disorder Father    Diabetes Father    Hypertension  Father    Hypertension Mother    Kidney failure Mother    Hypertension Brother    Liver cancer Brother    Other Brother        cyst on kidney   Hypertension Sister    Hypertension Maternal Aunt    Hypertension Maternal Uncle    Hypertension Paternal Aunt    Hypertension Paternal Uncle     Social History: Social History   Tobacco Use  Smoking Status Every Day   Packs/day: 1.00   Years: 18.00   Additional pack years: 0.00   Total pack years: 18.00   Types: Cigarettes   Passive exposure: Current  Smokeless Tobacco Never   Social History   Substance and Sexual Activity  Alcohol Use No   Social History   Substance and Sexual Activity  Drug Use No    Allergies: Allergies  Allergen Reactions   Codeine Nausea Only   Hydrocodone Nausea Only    feels hot and insomia   Sulfa Antibiotics Hives   Penicillin V Nausea Only    Medications: Current Outpatient Medications  Medication Sig Dispense Refill   ALPRAZolam (XANAX) 0.25 MG tablet Take 0.25 mg by mouth 2 (two) times daily as needed for anxiety.     alum & mag hydroxide-simeth (MAALOX/MYLANTA) 200-200-20 MG/5ML suspension Take 15 mLs by mouth every 6 (six) hours as needed for indigestion or heartburn.     amLODipine (NORVASC) 10 MG tablet Take 10 mg by mouth daily as needed (High blood pressure).     budesonide (ENTOCORT EC) 3 MG 24 hr capsule Take 3 capsules (9 mg total) by mouth daily for 60 days, THEN 2 capsules (6 mg total) daily for 60 days, THEN 1 capsule (3 mg total) daily. 360 capsule 0   dexlansoprazole (DEXILANT) 60 MG capsule Take 1 capsule (60 mg total) by mouth daily. 90 capsule 3   estradiol (VIVELLE-DOT) 0.1 MG/24HR patch PLACE ONE PATCH ONTO SKIN TWICE A WEEK 8 patch 6   fluticasone (FLONASE) 50 MCG/ACT nasal spray Place 2 sprays into both nostrils daily as needed for allergies.     hyoscyamine (LEVSIN SL) 0.125 MG SL tablet Place 1 tablet (0.125 mg total) under the tongue every 6 (six) hours as needed  for cramping (abdominal pain). 90 tablet 2   Multiple Vitamin (MULTIVITAMIN WITH MINERALS) TABS tablet Take 1 tablet by mouth daily.     potassium chloride (MICRO-K) 10 MEQ CR capsule Take 10 mEq by mouth daily.     sucralfate (CARAFATE) 1 g tablet TAKE 1 TABLET BY MOUTH FOUR TIMES DAILY (WITH MEALS AND AT BEDTIME) 120 tablet 0   traMADol (ULTRAM) 50 MG tablet Take 50 mg by mouth every 6 (six) hours as needed for moderate pain.      valACYclovir (VALTREX) 1000 MG tablet Take 1,000 mg by mouth daily as needed (Flair -up).  5   No current facility-administered medications for this visit.    Review of Systems: GENERAL: negative for malaise, night sweats HEENT: No changes in hearing or vision, no nose bleeds or other nasal problems. NECK: Negative for lumps, goiter, pain and significant neck swelling RESPIRATORY: Negative for  cough, wheezing CARDIOVASCULAR: Negative for chest pain, leg swelling, palpitations, orthopnea GI: SEE HPI MUSCULOSKELETAL: Negative for joint pain or swelling, back pain, and muscle pain. SKIN: Negative for lesions, rash PSYCH: Negative for sleep disturbance, mood disorder and recent psychosocial stressors. HEMATOLOGY Negative for prolonged bleeding, bruising easily, and swollen nodes. ENDOCRINE: Negative for cold or heat intolerance, polyuria, polydipsia and goiter. NEURO: negative for tremor, gait imbalance, syncope and seizures. The remainder of the review of systems is noncontributory.   Physical Exam: BP 134/82 (BP Location: Left Arm, Patient Position: Sitting, Cuff Size: Normal)   Pulse (!) 106   Temp (!) 97.5 F (36.4 C) (Temporal)   Ht 5\' 4"  (1.626 m)   Wt 113 lb 1.6 oz (51.3 kg)   BMI 19.41 kg/m  GENERAL: The patient is AO x3, in no acute distress. HEENT: Head is normocephalic and atraumatic. EOMI are intact. Mouth is well hydrated and without lesions. NECK: Supple. No masses LUNGS: Clear to auscultation. No presence of rhonchi/wheezing/rales.  Adequate chest expansion HEART: RRR, normal s1 and s2. ABDOMEN: tender to palpation in the RLQ, no guarding, no peritoneal signs, and nondistended. BS +. No masses. EXTREMITIES: Without any cyanosis, clubbing, rash, lesions or edema. NEUROLOGIC: AOx3, no focal motor deficit. SKIN: no jaundice, no rashes  Imaging/Labs: as above  I personally reviewed and interpreted the available labs, imaging and endoscopic files.  Impression and Plan: [name] is a 57 y.o. 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  Patient had transient episode of mucousy stool which was followed by some right lower abdominal pain.  Her diarrhea has resolved but she is still having discomfort in her abdomen and pelvic area.  She has not presented any other red flag signs.  She has had recent cross-sectional abdominal imaging with normalization of her bowel movements, I do not consider that further testing is warranted yet, but she can proceed with her scheduled transvaginal ultrasound.  It is possible her symptoms are related to symptomatic irritable bowel syndrome, for which I advised her to take Levsin as needed.    I encouraged her to completely stop smoking as this may be driving part of her symptoms and may have led to her previous gastric ulcers.  She is due for repeat EGD which will be scheduled soon.  She is not currently having any more diarrhea and she will continue her budesonide taper.  If her diarrhea were to recur after this course, we may need to send a new taper but keep her on the 3 mg dosing.  -Schedule EGD for gastric ulcer surveillance -Smoking cessation is imperative! -Continue Dexilant 60 mg qday -Take Levsin 1 tablet q8h as needed for abdominal pain -Proceed with scheduled transvaginal  US -Finish budesonide taper  All questions were answered.      [name], MD Gastroenterology and Hepatology [facility] Gastroenterology

## 2023-04-03 ENCOUNTER — Encounter: Payer: Self-pay | Admitting: Adult Health

## 2023-04-03 ENCOUNTER — Other Ambulatory Visit (HOSPITAL_COMMUNITY)
Admission: RE | Admit: 2023-04-03 | Discharge: 2023-04-03 | Disposition: A | Discharge status: Home / Self Care | Payer: Medicaid Other | Source: Ambulatory Visit | Attending: Adult Health | Admitting: Adult Health

## 2023-04-03 ENCOUNTER — Ambulatory Visit: Payer: Medicaid Other | Admitting: Adult Health

## 2023-04-03 VITALS — BP 129/88 | HR 100 | Ht 64.0 in | Wt 113.2 lb

## 2023-04-03 DIAGNOSIS — N891 Moderate vaginal dysplasia: Secondary | ICD-10-CM | POA: Insufficient documentation

## 2023-04-03 DIAGNOSIS — Z90710 Acquired absence of both cervix and uterus: Secondary | ICD-10-CM | POA: Diagnosis not present

## 2023-04-03 DIAGNOSIS — R1031 Right lower quadrant pain: Secondary | ICD-10-CM

## 2023-04-03 DIAGNOSIS — N941 Unspecified dyspareunia: Secondary | ICD-10-CM | POA: Diagnosis not present

## 2023-04-03 NOTE — Progress Notes (Signed)
  Subjective:     Patient ID: [name], female   DOB: [birth year], 57 y.o.   MRN: [id number]  HPI [name] is a 57 year old white female, divorced, sp hysterectomy in complaining of RLQ pain on and off for weeks, more constant now.  PCP is Dr Sherril Croon.  Review of Systems RLQ pain on and off for 3 weeks, more constant now and radiates to back. Denies any problems with urination or bowel movements(she does have IBS) Does have pain with sex    Reviewed past medical,surgical, social and family history. Reviewed medications and allergies.  Objective:   Physical Exam BP 129/88 (BP Location: Left Arm, Patient Position: Sitting, Cuff Size: Normal)   Pulse 100   Ht 5\' 4"  (1.626 m)   Wt 113 lb 3.2 oz (51.3 kg)   BMI 19.43 kg/m     Skin warm and dry, and tan.Pelvic: external genitalia is normal in appearance no lesions, vagina: pale, no lesions,urethra has no lesions or masses noted, cervix and uterus are absent, pap with GC/CHL and HR HPV genotyping performed, adnexa: no masses, RLQ tenderness noted, no rebound. Bladder is non tender and no masses felt.   Fall risk is low  Upstream - 04/03/23 1456       Pregnancy Intention Screening   Does the patient want to become pregnant in the next year? N/A    Does the patient's partner want to become pregnant in the next year? N/A    Would the patient like to discuss contraceptive options today? N/A      Contraception Wrap Up   Current Method Female Sterilization   hyst   End Method Female Sterilization   hyst   Contraception Counseling Provided No            Examination chaperoned by Malachy Mood LPN  Assessment:     1. RLQ abdominal pain Has pain on and off for 3 weeks, more constant now Will get pelvic US to assess ovaries, 04/14/23 at 1:30 pm at [facility] and will talk when results back  - US PELVIC COMPLETE WITH TRANSVAGINAL; Future  2. Dyspareunia, female   3. VAIN II (vaginal intraepithelial neoplasia grade II) Pap sent  -  Cytology - PAP( [place])  4. S/P hysterectomy Pap sent - Cytology - PAP( [place])     Plan:     Follow up prn

## 2023-04-10 LAB — CYTOLOGY - PAP
Chlamydia: NEGATIVE
Comment: NEGATIVE
Comment: NEGATIVE
Comment: NORMAL
Diagnosis: NEGATIVE
High risk HPV: NEGATIVE
Neisseria Gonorrhea: NEGATIVE

## 2023-04-14 ENCOUNTER — Ambulatory Visit (HOSPITAL_COMMUNITY)
Admission: RE | Admit: 2023-04-14 | Discharge: 2023-04-14 | Disposition: A | Discharge status: Home / Self Care | Payer: Medicaid Other | Source: Ambulatory Visit | Attending: Adult Health | Admitting: Adult Health

## 2023-04-14 DIAGNOSIS — R1031 Right lower quadrant pain: Secondary | ICD-10-CM | POA: Diagnosis not present

## 2023-04-28 ENCOUNTER — Telehealth (INDEPENDENT_AMBULATORY_CARE_PROVIDER_SITE_OTHER): Payer: Self-pay | Admitting: Gastroenterology

## 2023-04-28 NOTE — Telephone Encounter (Signed)
Pt contacted to schedule EGD (ASA 1). Prefers afternoon Fridays. Pt states she will need to give me a call back.

## 2023-05-07 ENCOUNTER — Other Ambulatory Visit (INDEPENDENT_AMBULATORY_CARE_PROVIDER_SITE_OTHER): Payer: Self-pay | Admitting: Gastroenterology

## 2023-07-08 ENCOUNTER — Other Ambulatory Visit: Payer: Self-pay

## 2023-07-08 ENCOUNTER — Encounter (INDEPENDENT_AMBULATORY_CARE_PROVIDER_SITE_OTHER): Payer: Self-pay | Admitting: Gastroenterology

## 2023-07-08 ENCOUNTER — Emergency Department (HOSPITAL_COMMUNITY)
Admission: EM | Admit: 2023-07-08 | Discharge: 2023-07-08 | Disposition: A | Discharge status: Home / Self Care | Payer: Medicaid Other | Attending: Emergency Medicine | Admitting: Emergency Medicine

## 2023-07-08 ENCOUNTER — Encounter (HOSPITAL_COMMUNITY): Payer: Self-pay | Admitting: Emergency Medicine

## 2023-07-08 ENCOUNTER — Emergency Department (HOSPITAL_COMMUNITY): Payer: Medicaid Other

## 2023-07-08 DIAGNOSIS — Z79899 Other long term (current) drug therapy: Secondary | ICD-10-CM | POA: Diagnosis not present

## 2023-07-08 DIAGNOSIS — D72829 Elevated white blood cell count, unspecified: Secondary | ICD-10-CM | POA: Diagnosis not present

## 2023-07-08 DIAGNOSIS — I10 Essential (primary) hypertension: Secondary | ICD-10-CM | POA: Insufficient documentation

## 2023-07-08 DIAGNOSIS — R1031 Right lower quadrant pain: Secondary | ICD-10-CM | POA: Diagnosis present

## 2023-07-08 LAB — BASIC METABOLIC PANEL
Anion gap: 11 (ref 5–15)
BUN: 24 mg/dL — ABNORMAL HIGH (ref 6–20)
CO2: 30 mmol/L (ref 22–32)
Calcium: 11.9 mg/dL — ABNORMAL HIGH (ref 8.9–10.3)
Chloride: 94 mmol/L — ABNORMAL LOW (ref 98–111)
Creatinine, Ser: 1.13 mg/dL — ABNORMAL HIGH (ref 0.44–1.00)
GFR, Estimated: 57 mL/min — ABNORMAL LOW (ref >60)
Glucose, Bld: 123 mg/dL — ABNORMAL HIGH (ref 70–99)
Potassium: 4.3 mmol/L (ref 3.5–5.1)
Sodium: 135 mmol/L (ref 135–145)

## 2023-07-08 LAB — URINALYSIS, ROUTINE W REFLEX MICROSCOPIC
Bacteria, UA: NONE SEEN
Bilirubin Urine: NEGATIVE
Glucose, UA: NEGATIVE mg/dL
Ketones, ur: NEGATIVE mg/dL
Leukocytes,Ua: NEGATIVE
Nitrite: NEGATIVE
Protein, ur: NEGATIVE mg/dL
Specific Gravity, Urine: 1.006 (ref 1.005–1.030)
pH: 7 (ref 5.0–8.0)

## 2023-07-08 LAB — COMPREHENSIVE METABOLIC PANEL
ALT: 14 U/L (ref 0–44)
AST: 19 U/L (ref 15–41)
Albumin: 3.6 g/dL (ref 3.5–5.0)
Alkaline Phosphatase: 77 U/L (ref 38–126)
Anion gap: 13 (ref 5–15)
BUN: 24 mg/dL — ABNORMAL HIGH (ref 6–20)
CO2: 28 mmol/L (ref 22–32)
Calcium: 11.8 mg/dL — ABNORMAL HIGH (ref 8.9–10.3)
Chloride: 93 mmol/L — ABNORMAL LOW (ref 98–111)
Creatinine, Ser: 1.08 mg/dL — ABNORMAL HIGH (ref 0.44–1.00)
GFR, Estimated: >60 mL/min (ref >60)
Glucose, Bld: 122 mg/dL — ABNORMAL HIGH (ref 70–99)
Potassium: 4.3 mmol/L (ref 3.5–5.1)
Sodium: 134 mmol/L — ABNORMAL LOW (ref 135–145)
Total Bilirubin: 0.4 mg/dL (ref 0.3–1.2)
Total Protein: 7.5 g/dL (ref 6.5–8.1)

## 2023-07-08 LAB — CBC
HCT: 44.9 % (ref 36.0–46.0)
Hemoglobin: 14.5 g/dL (ref 12.0–15.0)
MCH: 29.9 pg (ref 26.0–34.0)
MCHC: 32.3 g/dL (ref 30.0–36.0)
MCV: 92.6 fL (ref 80.0–100.0)
Platelets: 417 10*3/uL — ABNORMAL HIGH (ref 150–400)
RBC: 4.85 MIL/uL (ref 3.87–5.11)
RDW: 15.8 % — ABNORMAL HIGH (ref 11.5–15.5)
WBC: 14.4 10*3/uL — ABNORMAL HIGH (ref 4.0–10.5)
nRBC: 0 % (ref 0.0–0.2)

## 2023-07-08 LAB — LIPASE, BLOOD: Lipase: 30 U/L (ref 11–51)

## 2023-07-08 MED ORDER — ONDANSETRON HCL 4 MG/2ML IJ SOLN
4.0000 mg | Freq: Once | INTRAMUSCULAR | Status: AC
  Administered 2023-07-08: 4 mg via INTRAVENOUS
  Filled 2023-07-08: qty 2

## 2023-07-08 MED ORDER — CIPROFLOXACIN HCL 500 MG PO TABS: 500.0000 mg | ORAL_TABLET | Freq: Two times a day (BID) | ORAL | 0 refills | Status: DC

## 2023-07-08 MED ORDER — HYDROMORPHONE HCL 1 MG/ML IJ SOLN
1.0000 mg | Freq: Once | INTRAMUSCULAR | Status: AC
  Administered 2023-07-08: 1 mg via INTRAVENOUS
  Filled 2023-07-08: qty 1

## 2023-07-08 MED ORDER — IOHEXOL 300 MG/ML  SOLN
100.0000 mL | Freq: Once | INTRAMUSCULAR | Status: AC | PRN
  Administered 2023-07-08: 100 mL via INTRAVENOUS

## 2023-07-08 MED ORDER — METRONIDAZOLE 500 MG PO TABS: 500.0000 mg | ORAL_TABLET | Freq: Two times a day (BID) | ORAL | 0 refills | Status: DC

## 2023-07-08 MED ORDER — ONDANSETRON HCL 4 MG PO TABS
4.0000 mg | ORAL_TABLET | Freq: Four times a day (QID) | ORAL | 0 refills | Status: AC
Start: 2023-07-08 — End: ?

## 2023-07-08 MED ORDER — HYDROMORPHONE HCL 1 MG/ML IJ SOLN
0.5000 mg | Freq: Once | INTRAMUSCULAR | Status: AC
  Administered 2023-07-08: 0.5 mg via INTRAVENOUS
  Filled 2023-07-08: qty 0.5

## 2023-07-08 NOTE — Discharge Instructions (Signed)
Take the antibiotics as directed.  Bland diet as tolerated.  Call your GI provider to arrange follow-up appointment.  Return to emergency department for any new or worsening symptoms.

## 2023-07-08 NOTE — ED Triage Notes (Signed)
Pt presents with right flank pain x 2 weeks, had imaging done at [facility], but hasn't received results.

## 2023-07-08 NOTE — ED Provider Notes (Signed)
[place] [facility] AT [facility] Provider Note   CSN: [id number] Arrival date & time: 07/08/23  0844     History  Chief Complaint  Patient presents with   Flank Pain    Right    [name] is a 57 y.o. female.   Flank Pain Associated symptoms include abdominal pain. Pertinent negatives include no chest pain and no shortness of breath.        [name] is a 57 y.o. female with past medical history of hypertension, fibromyalgia, chronic abdominal pain, history of cervical dysplasia who presents to the Emergency Department complaining of right flank pain x 2 weeks.  States she has had right abdominal pain off and on for years.  Was seen by PCP and had imaging done at [facility] last week but does not yet know the results.  Pain became worse this morning upon waking.  She states she has sharp stabbing pains from her right flank that radiates into her right lower abdomen.  No history of kidney stones.  She denies any pain or burning with urination or hematuria.  She feels like she is able to completely empty her bladder when she voids.   Home Medications Prior to Admission medications   Medication Sig Start Date End Date Taking? Authorizing Provider  ALPRAZolam (XANAX) 0.25 MG tablet Take 0.25 mg by mouth 2 (two) times daily as needed for anxiety.    [provider]  alum & mag hydroxide-simeth (MAALOX/MYLANTA) 200-200-20 MG/5ML suspension Take 15 mLs by mouth every 6 (six) hours as needed for indigestion or heartburn.    [provider]  amLODipine (NORVASC) 10 MG tablet Take 10 mg by mouth daily as needed (High blood pressure).    [provider]  dexlansoprazole (DEXILANT) 60 MG capsule Take 1 capsule (60 mg total) by mouth daily. 10/09/22   Dolores Frame, MD  estradiol (VIVELLE-DOT) 0.1 MG/24HR patch PLACE ONE PATCH ONTO SKIN TWICE A WEEK 01/09/23   Adline Potter, NP  fluticasone (FLONASE) 50 MCG/ACT  nasal spray Place 2 sprays into both nostrils daily as needed for allergies. 11/10/20   [provider]  hyoscyamine (LEVSIN SL) 0.125 MG SL tablet Place 1 tablet (0.125 mg total) under the tongue every 8 (eight) hours as needed for cramping (abdominal pain). 03/31/23   Dolores Frame, MD  Multiple Vitamin (MULTIVITAMIN WITH MINERALS) TABS tablet Take 1 tablet by mouth daily.    [provider]  potassium chloride (MICRO-K) 10 MEQ CR capsule Take 10 mEq by mouth daily. 09/05/20   [provider]  sucralfate (CARAFATE) 1 g tablet TAKE 1 TABLET BY MOUTH FOUR TIMES DAILY (WITH MEALS AND AT BEDTIME) 05/08/23   Marguerita Merles, Reuel Boom, MD  traMADol (ULTRAM) 50 MG tablet Take 50 mg by mouth every 6 (six) hours as needed for moderate pain.     [provider]  valACYclovir (VALTREX) 1000 MG tablet Take 1,000 mg by mouth daily as needed (Flair -up). 07/26/16   [provider]      Allergies    Codeine, Hydrocodone, Sulfa antibiotics, and Penicillin v    Review of Systems   Review of Systems  Constitutional:  Negative for chills and fever.  Respiratory:  Negative for shortness of breath.   Cardiovascular:  Negative for chest pain.  Gastrointestinal:  Positive for abdominal pain and nausea. Negative for diarrhea and vomiting.  Genitourinary:  Positive for flank pain. Negative for decreased urine volume, difficulty  urinating, dysuria and frequency.  Musculoskeletal:  Positive for back pain.  Neurological:  Negative for weakness and numbness.    Physical Exam Updated Vital Signs BP (!) 144/86 (BP Location: Right Arm)   Pulse (!) 111   Temp 98 F (36.7 C) (Oral)   Resp 20   Ht 5\' 4"  (1.626 m)   Wt 50.8 kg   SpO2 (!) 88%   BMI 19.22 kg/m  Physical Exam Vitals and nursing note reviewed.  Constitutional:      General: She is not in acute distress.    Appearance: She is not toxic-appearing.     Comments: Patient is rocking back and forth on  the stretcher and appears tearful on exam  HENT:     Mouth/Throat:     Mouth: Mucous membranes are moist.  Cardiovascular:     Rate and Rhythm: Regular rhythm. Tachycardia present.     Pulses: Normal pulses.  Pulmonary:     Effort: Pulmonary effort is normal.  Chest:     Chest wall: No tenderness.  Abdominal:     Palpations: Abdomen is soft.     Tenderness: There is no abdominal tenderness. There is right CVA tenderness. There is no guarding.     Comments: Diffuse tenderness to palpation right CVA area and right lower quadrant.  No guarding  Musculoskeletal:        General: Normal range of motion.     Right lower leg: No edema.     Left lower leg: No edema.  Skin:    General: Skin is warm.     Capillary Refill: Capillary refill takes less than 2 seconds.     Findings: No rash.  Neurological:     General: No focal deficit present.     Mental Status: She is alert.     ED Results / Procedures / Treatments   Labs (all labs ordered are listed, but only abnormal results are displayed) Labs Reviewed  URINALYSIS, ROUTINE W REFLEX MICROSCOPIC - Abnormal; Notable for the following components:      Result Value   Color, Urine STRAW (*)    APPearance HAZY (*)    Hgb urine dipstick SMALL (*)    All other components within normal limits  BASIC METABOLIC PANEL - Abnormal; Notable for the following components:   Chloride 94 (*)    Glucose, Bld 123 (*)    BUN 24 (*)    Creatinine, Ser 1.13 (*)    Calcium 11.9 (*)    GFR, Estimated 57 (*)    All other components within normal limits  CBC - Abnormal; Notable for the following components:   WBC 14.4 (*)    RDW 15.8 (*)    Platelets 417 (*)    All other components within normal limits  COMPREHENSIVE METABOLIC PANEL - Abnormal; Notable for the following components:   Sodium 134 (*)    Chloride 93 (*)    Glucose, Bld 122 (*)    BUN 24 (*)    Creatinine, Ser 1.08 (*)    Calcium 11.8 (*)    All other components within normal limits   LIPASE, BLOOD    EKG None  Radiology CT ABDOMEN PELVIS W CONTRAST  Result Date: 07/08/2023 CLINICAL DATA:  Right flank pain. EXAM: CT ABDOMEN AND PELVIS WITH CONTRAST TECHNIQUE: Multidetector CT imaging of the abdomen and pelvis was performed using the standard protocol following bolus administration of intravenous contrast. RADIATION DOSE REDUCTION: This exam was performed according to the departmental dose-optimization  program which includes automated exposure control, adjustment of the mA and/or kV according to patient size and/or use of iterative reconstruction technique. CONTRAST:  OMNIPAQUE IOHEXOL 300 MG/ML  SOLN COMPARISON:  CT abdomen/pelvis dated September 11, 2022. FINDINGS: Lower chest: No acute abnormality. Hepatobiliary: No focal liver abnormality is seen. No gallstones, gallbladder wall thickening, or biliary dilatation. Pancreas: Unremarkable. No pancreatic ductal dilatation or surrounding inflammatory changes. Spleen: Normal in size without focal abnormality. Adrenals/Urinary Tract: Adrenal glands are unremarkable. Kidneys are normal, without renal calculi, suspicious focal lesion, or hydronephrosis. Stable 2.4 cm right renal cyst, for which no follow-up imaging is recommended. Bladder is unremarkable. Stomach/Bowel: Similar wall thickening of the gastric antrum with irregular nodular outpouching along the anterior superior gastric antrum is again noted, possibly reflecting a gastric ulcer. No significant surrounding inflammation. Appendix appears normal. No evidence of obstruction. There is wall thickening of the distal descending and sigmoid colon without evidence of significant edema or surrounding inflammatory changes. No intraperitoneal free fluid or free air. Vascular/Lymphatic: The abdominal aorta is normal in caliber. No enlarged abdominal or pelvic lymph nodes. Reproductive: Status post hysterectomy. No adnexal masses. Other: No abdominal wall hernia. Musculoskeletal: No  acute or significant osseous findings. IMPRESSION: 1. Wall thickening of the distal descending and sigmoid colon without significant associated inflammatory changes could relate to a mild nonspecific colitis. 2. No renal calculi or hydronephrosis. 3. Additional unchanged ancillary findings as described above. Electronically Signed   By: Hart Robinsons M.D.   On: 07/08/2023 13:39    Procedures Procedures    Medications Ordered in ED Medications  HYDROmorphone (DILAUDID) injection 1 mg (1 mg Intravenous Given 07/08/23 0945)  ondansetron (ZOFRAN) injection 4 mg (4 mg Intravenous Given 07/08/23 0945)    ED Course/ Medical Decision Making/ A&P                                 Medical Decision Making Patient with worsening right flank and right lower quadrant pain since waking this morning.  Has history of chronic right abdominal pain with unclear cause.  Patient states that she had imaging last week at [facility] but does not know the results.  Nausea without vomiting, no fever no radicular symptoms or extremity numbness.  Denies any difficulty with voiding   Differential at this time would include but not limited to kidney stone, pyelonephritis, GYN cause of pelvic pain, acute appendicitis musculoskeletal pain  Amount and/or Complexity of Data Reviewed Labs: ordered.    Details: Labs interpreted by me show urinalysis with small amount of blood, no evidence of infection, chemistries show slight elevation in kidney function studies, leukocytosis with white count of 14,000 Radiology: ordered.    Details: T abdomen and pelvis shows 1 thickening of the distal descending and sigmoid colon without significant associated inflammatory changes, could relate to mild nonspecific colitis. Discussion of management or test interpretation with external provider(s): Patient reports recent imaging at [facility] but on review of care everywhere I am unable to find these results, recent labs or imaging orders.  On  recheck, patient resting comfortably.  Nausea resolved.  Pain improved.  Discussed CT findings.  Patient reports history of recurrent colitis currently followed by Dr. Levon Hedger with GI We will start antibiotics.  Patient states she cannot take Augmentin due to GI upset, willing to take Cipro Flagyl  Appears appropriate for d/c, will f/u with GI.  Return precautions discussed.   Risk Prescription  drug management.           Final Clinical Impression(s) / ED Diagnoses Final diagnoses:  Right lower quadrant abdominal pain    Rx / DC Orders ED Discharge Orders     None         Pauline Aus, PA-C 07/08/23 1412    Bethann Berkshire, MD 07/10/23 1652

## 2023-07-10 ENCOUNTER — Encounter (INDEPENDENT_AMBULATORY_CARE_PROVIDER_SITE_OTHER): Payer: Self-pay

## 2023-07-14 ENCOUNTER — Telehealth (INDEPENDENT_AMBULATORY_CARE_PROVIDER_SITE_OTHER): Payer: Medicaid Other | Admitting: Gastroenterology

## 2023-07-15 ENCOUNTER — Telehealth (INDEPENDENT_AMBULATORY_CARE_PROVIDER_SITE_OTHER): Payer: Self-pay | Admitting: *Deleted

## 2023-07-15 ENCOUNTER — Other Ambulatory Visit (INDEPENDENT_AMBULATORY_CARE_PROVIDER_SITE_OTHER): Payer: Self-pay | Admitting: *Deleted

## 2023-07-15 NOTE — Telephone Encounter (Signed)
How many pills has she taken so far?  It is very hard to make these decisions if she does not follow up in clinic, she did not show up for her appointment yesterday

## 2023-07-15 NOTE — Telephone Encounter (Signed)
States she never took cipro or flagyl that the hospital gave her and she started budesonide 3mg  one bid on oct 14th and has been taking that every day since then. She said her car messed up and visit was changed to phone visit and she missed the call somehow she said.

## 2023-07-15 NOTE — Telephone Encounter (Signed)
Patient states she is unable to take cipro and flagyl that was given to her in ED for colitis due to it making her "deathly ill". I asked her what she meant by that and she said it causes hives and nausea. She said she still had budesonide at home. Almost a whole bottle almost 90 tablets she did take some previously and wanted to  know if she should take those?  [phone]

## 2023-07-15 NOTE — Telephone Encounter (Signed)
Discussed with patient. Pt scheduled with Tobi Bastos tomorrow for a phone visit. She states she cannot come in office. She said best number to call is [phone]

## 2023-07-15 NOTE — Telephone Encounter (Signed)
She never called back, despite Crystal leaving a message.  Please ask her to make an appointment - phone call is OK in next available with me or any of the APPs. Mitzie, please try to arrange this.  Thanks

## 2023-07-16 ENCOUNTER — Ambulatory Visit (INDEPENDENT_AMBULATORY_CARE_PROVIDER_SITE_OTHER): Payer: Medicaid Other | Admitting: Gastroenterology

## 2023-07-16 ENCOUNTER — Telehealth: Payer: Medicaid Other | Admitting: Gastroenterology

## 2023-07-16 DIAGNOSIS — R109 Unspecified abdominal pain: Secondary | ICD-10-CM | POA: Diagnosis not present

## 2023-07-16 MED ORDER — AMOXICILLIN-POT CLAVULANATE 875-125 MG PO TABS: 1.0000 | ORAL_TABLET | Freq: Two times a day (BID) | ORAL | 0 refills | Status: DC

## 2023-07-16 NOTE — Progress Notes (Addendum)
Primary Care Physician:  Ignatius Specking, MD  Primary GI: Dr. Levon Hedger  Patient Location: Home   Provider Location: [facility] office   Reason for Visit: Abdominal pain    Persons present on the virtual encounter, with roles: patient and NP   Total time (minutes) spent on medical discussion: #15 minutes   Telephone note Due to COVID-19, visit is conducted virtually and was requested by patient.   I connected with [name] on 07/16/23 at  8:00 AM EDT by telephone and verified that I am speaking with the correct person using two identifiers.   I discussed the limitations, risks, security and privacy concerns of performing an evaluation and management service by telephone and the availability of in person appointments. I also discussed with the patient that there may be a patient responsible charge related to this service. The patient expressed understanding and agreed to proceed.  Chief Complaint  Patient presents with   Hospitalization Follow-up    Patient doing a phone call today. ED follow up on colitis. Patient states she is unable to take cipro and flagyl given to her by ED. States it makes her sick. She is currently on budesonide 3mg  one bid.      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   She states she ate a taco salad over the weekend of the 12th/13th. Started to have RUQ pain wrapping around back and into shoulder. Had looser stool , about 4 loose stools in 24 hours prior to presenting to the ED. She presented to the ED and CT abd/pelvis with contrast showed wall thickening of the distal descending and sigmoid colon without significant associated inflammatory changes that could related to midl nonspecific colitis. No  rectal bleeding. Prescribed cipro and flagyl. Has nausea when taking. Didn't take any. Denies any diarrhea now. Was short lived. No diarrhea. Soft BM daily now. Pain still present. No fever or chills. No urinary symptoms. Pain comes and goes. Eating broth.   Cutting back with smoking to less than a pack per day. No NSAIDs. Can't lay on right side. States right-sided pain has been going on for several months. Upon review of chart, RUQ pain has been long-standing. No allstones on [place] in past. HIDA in March 2024 at [facility] with normal EF 76%.   Started budesonide last week on her own and then stopped. She is concerned about her gallbladder.    Last EGD: 10/25/2022 1 cm hiatal hernia 110 mm cratered gastric ulcer was found in the gastric antrum (focal erosion and reactive gastropathy without H. pylori or intestinal metaplasia), 2 nonbleeding cratered duodenal ulcers in the first portion of the duodenum with largest measuring 15 mm, presence of a mild stenosis in the first portion of the duodenum, diverticulum in the second portion of the duodenum   Patient was scheduled for repeat EGD in 3 months but this was not performed.  Advised to stop using stimulatory medications and quit smoking. Still has not been performed.   Last Colonoscopy: 10/25/2022 Normal colon with biopsies consistent with collagenous colitis, internal hemorrhoids. Repeat colonoscopy in 10 years.  Past Medical History:  Diagnosis Date   Anxiety    Cyst of right kidney    Depression    Essential hypertension, benign 12/26/2016   Fibroadenoma of  right breast    S/P LUMPECTOMY W/ RADIOACTIVE SEED IMPLANT 05-19-2015   Fibromyalgia    GERD (gastroesophageal reflux disease)    History of cervical dysplasia    s/p conzation and recurrent s/p  vaginal hysterectomy 2008   History of duodenal ulcer    1992--  S/P  REPAIR PERFORATED ULCER   History of gastric ulcer    age 54   Hypertension    IBS (irritable bowel syndrome)    Vaginal Pap  smear, abnormal    VIN II (vulvar intraepithelial neoplasia II)    Wears glasses    Wears partial dentures    lower     Past Surgical History:  Procedure Laterality Date   BALLOON DILATION  12/21/2021   Procedure: BALLOON DILATION;  Surgeon: Dolores Frame, MD;  [facility];  Service: Gastroenterology;;   BIOPSY  12/19/2021   Procedure: BIOPSY;  Surgeon: Dolores Frame, MD;  [facility];  Service: Gastroenterology;;   BIOPSY  10/25/2022   Procedure: BIOPSY;  Surgeon: Dolores Frame, MD;  [facility];  Service: Gastroenterology;;   BLADDER SLING PROCEDURE  2005  approx.   CERVICAL CONIZATION W/BX  2006  approx   CO2 LASER APPLICATION N/A 06/13/2016   Procedure: CO2 LASER OF THE VAGINA;  Surgeon: Adolphus Birchwood, MD;  Location[facility];  Service: Gynecology;  Laterality: N/A;   COLONOSCOPY WITH PROPOFOL N/A 10/25/2022   Procedure: COLONOSCOPY WITH PROPOFOL;  Surgeon: Dolores Frame, MD;  [facility];  Service: Gastroenterology;  Laterality: N/A;  9:45am, asa 1-2   ESOPHAGEAL DILATION N/A 06/14/2020   Procedure: ESOPHAGEAL DILATION;  Surgeon: Malissa Hippo, MD;  [facility];  Service: Endoscopy;  Laterality: N/A;   ESOPHAGEAL DILATION N/A 10/04/2020   Procedure: ESOPHAGEAL DILATION;  Surgeon: Malissa Hippo, MD;  [facility];  Service: Endoscopy;  Laterality: N/A;   ESOPHAGOGASTRODUODENOSCOPY N/A 01/21/2017   Procedure: ESOPHAGOGASTRODUODENOSCOPY (EGD);  Surgeon: Malissa Hippo, MD;  [facility];  Service: Endoscopy;  Laterality: N/A;   ESOPHAGOGASTRODUODENOSCOPY N/A 07/17/2017   Procedure: ESOPHAGOGASTRODUODENOSCOPY (EGD);  Surgeon: Malissa Hippo, MD;  [facility];  Service: Endoscopy;  Laterality: N/A;  1:00   ESOPHAGOGASTRODUODENOSCOPY N/A 10/16/2017   Procedure: ESOPHAGOGASTRODUODENOSCOPY (EGD)WITH DILATION;  Surgeon: Malissa Hippo,  MD;  [facility];  Service: Endoscopy;  Laterality: N/A;  1030   ESOPHAGOGASTRODUODENOSCOPY (EGD) WITH PROPOFOL N/A 01/23/2017   Procedure: ESOPHAGOGASTRODUODENOSCOPY (EGD) WITH PROPOFOL;  Surgeon: Malissa Hippo, MD;  [facility];  Service: Endoscopy;  Laterality: N/A;  duodenal striture dilation   ESOPHAGOGASTRODUODENOSCOPY (EGD) WITH PROPOFOL N/A 06/14/2020   Procedure: ESOPHAGOGASTRODUODENOSCOPY (EGD) WITH PROPOFOL;  Surgeon: Malissa Hippo, MD;  [facility];  Service: Endoscopy;  Laterality: N/A;  1055   ESOPHAGOGASTRODUODENOSCOPY (EGD) WITH PROPOFOL N/A 10/04/2020   Procedure: ESOPHAGOGASTRODUODENOSCOPY (EGD) WITH PROPOFOL;  Surgeon: Malissa Hippo, MD;  [facility];  Service: Endoscopy;  Laterality: N/A;  1:00   ESOPHAGOGASTRODUODENOSCOPY (EGD) WITH PROPOFOL N/A 12/19/2021   Procedure: ESOPHAGOGASTRODUODENOSCOPY (EGD) WITH PROPOFOL;  Surgeon: Dolores Frame, MD;  [facility];  Service: Gastroenterology;  Laterality: N/A;  815   ESOPHAGOGASTRODUODENOSCOPY (EGD) WITH PROPOFOL N/A 12/21/2021   Procedure: ESOPHAGOGASTRODUODENOSCOPY (EGD) WITH PROPOFOL;  Surgeon: Dolores Frame, MD;  [facility];  Service: Gastroenterology;  Laterality: N/A;  205 ASA 1   ESOPHAGOGASTRODUODENOSCOPY (EGD) WITH PROPOFOL N/A  10/25/2022   Procedure: ESOPHAGOGASTRODUODENOSCOPY (EGD) WITH PROPOFOL;  Surgeon: Dolores Frame, MD;  [facility];  Service: Gastroenterology;  Laterality: N/A;   LAPAROSCOPY N/A 12/19/2017   Procedure: LAPAROSCOPY, PYLOROPLASTY WITH Peggye Ley;  Surgeon: Luretha Murphy, MD;  Location: [facility];  Service: General;  Laterality: N/A;   NASAL SEPTOPLASTY W/ TURBINOPLASTY Bilateral 04/30/2019   Procedure: BILATERAL NASAL SEPTOPLASTY WITH TURBINATE REDUCTION;  Surgeon: Newman Pies, MD;  Location: [facility];  Service: ENT;  Laterality: Bilateral;   RADIOACTIVE SEED GUIDED  EXCISIONAL BREAST BIOPSY Right 05/19/2015   Procedure: EXCISIONAL RIGHT BREAST MASS WITH RADIOACTIVE SEED LOCALIZATION;  Surgeon: Claud Kelp, MD;  Location: [facility];  Service: General;  Laterality: Right;   REPAIR OF PERFORATED ULCER  1992   duodenal   VAGINAL HYSTERECTOMY  11/13/2006     Current Meds  Medication Sig   ALPRAZolam (XANAX) 0.25 MG tablet Take 0.25 mg by mouth 2 (two) times daily as needed for anxiety.   alum & mag hydroxide-simeth (MAALOX/MYLANTA) 200-200-20 MG/5ML suspension Take 15 mLs by mouth every 6 (six) hours as needed for indigestion or heartburn.   amLODipine (NORVASC) 10 MG tablet Take 10 mg by mouth daily as needed (High blood pressure).   budesonide (ENTOCORT EC) 3 MG 24 hr capsule Take by mouth daily. One bid   dexlansoprazole (DEXILANT) 60 MG capsule Take 1 capsule (60 mg total) by mouth daily.   estradiol (VIVELLE-DOT) 0.1 MG/24HR patch PLACE ONE PATCH ONTO SKIN TWICE A WEEK   fluticasone (FLONASE) 50 MCG/ACT nasal spray Place 2 sprays into both nostrils daily as needed for allergies.   hyoscyamine (LEVSIN SL) 0.125 MG SL tablet Place 1 tablet (0.125 mg total) under the tongue every 8 (eight) hours as needed for cramping (abdominal pain).   Multiple Vitamin (MULTIVITAMIN WITH MINERALS) TABS tablet Take 1 tablet by mouth daily.   ondansetron (ZOFRAN) 4 MG tablet Take 1 tablet (4 mg total) by mouth every 6 (six) hours. As needed for nausea vomiting   potassium chloride (MICRO-K) 10 MEQ CR capsule Take 10 mEq by mouth daily.   sucralfate (CARAFATE) 1 g tablet TAKE 1 TABLET BY MOUTH FOUR TIMES DAILY (WITH MEALS AND AT BEDTIME)   traMADol (ULTRAM) 50 MG tablet Take 50 mg by mouth every 6 (six) hours as needed for moderate pain.    valACYclovir (VALTREX) 1000 MG tablet Take 1,000 mg by mouth daily as needed (Flair -up).     Family History  Problem Relation Age of Onset   Clotting disorder Father    Diabetes Father    Hypertension Father     Hypertension Mother    Kidney failure Mother    Hypertension Brother    Liver cancer Brother    Other Brother        cyst on kidney   Hypertension Sister    Hypertension Maternal Aunt    Hypertension Maternal Uncle    Hypertension Paternal Aunt    Hypertension Paternal Uncle     Social History   Socioeconomic History   Marital status: Divorced    Spouse name: Not on file   Number of children: Not on file   Years of education: Not on file   Highest education level: Not on file  Occupational History   Not on file  Tobacco Use   Smoking status: Every Day    Current packs/day: 1.00    Average packs/day: 1 pack/day for 18.0 years (18.0 ttl pk-yrs)  Types: Cigarettes    Passive exposure: Current   Smokeless tobacco: Never  Vaping Use   Vaping status: Never Used  Substance and Sexual Activity   Alcohol use: No   Drug use: No   Sexual activity: Not Currently    Birth control/protection: Surgical    Comment: hyst  Other Topics Concern   Not on file  Social History Narrative   Not on file   Social Determinants of Health   Financial Resource Strain: High Risk (12/27/2019)   Overall Financial Resource Strain (CARDIA)    Difficulty of Paying Living Expenses: Hard  Food Insecurity: No Food Insecurity (12/27/2019)   Hunger Vital Sign    Worried About Running Out of Food in the Last Year: Never true    Ran Out of Food in the Last Year: Never true  Transportation Needs: No Transportation Needs (12/27/2019)   PRAPARE - Administrator, Civil Service (Medical): No    Lack of Transportation (Non-Medical): No  Physical Activity: Inactive (12/27/2019)   Exercise Vital Sign    Days of Exercise per Week: 0 days    Minutes of Exercise per Session: 10 min  Stress: Stress Concern Present (12/27/2019)   [employer] of Occupational Health - Occupational Stress Questionnaire    Feeling of Stress : Very much  Social Connections: Socially Isolated (12/27/2019)   Social  Connection and Isolation Panel [NHANES]    Frequency of Communication with Friends and Family: Twice a week    Frequency of Social Gatherings with Friends and Family: Never    Attends Religious Services: Never    Database administrator or Organizations: No    Attends Engineer, structural: Never    Marital Status: Divorced       Review of Systems: Gen: Denies fever, chills, anorexia. Denies fatigue, weakness, weight loss.  CV: Denies chest pain, palpitations, syncope, peripheral edema, and claudication. Resp: Denies dyspnea at rest, cough, wheezing, coughing up blood, and pleurisy. GI: see HPI Derm: Denies rash, itching, dry skin Psych: Denies depression, anxiety, memory loss, confusion. No homicidal or suicidal ideation.  Heme: Denies bruising, bleeding, and enlarged lymph nodes.  Lab Results  Component Value Date   WBC 14.4 (H) 07/08/2023   HGB 14.5 07/08/2023   HCT 44.9 07/08/2023   MCV 92.6 07/08/2023   PLT 417 (H) 07/08/2023   Lab Results  Component Value Date   LIPASE 30 07/08/2023   Lab Results  Component Value Date   ALT 14 07/08/2023   AST 19 07/08/2023   ALKPHOS 77 07/08/2023   BILITOT 0.4 07/08/2023     Observations/Objective: No distress. 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   Difficult to ascertain exact location of pain as this visit was over the phone, but after much questioning, she does attribute acute on chronic pain in RUQ that radiates around back and up into shoulder. Lipase, LFTs normal. No known gallstones, and HIDA previously normal. CT with non-specific colitis but she had self-limiting diarrhea that is now resolved. I  suspect she may have had a self-limiting gastroenteritis but now continues with right-sided pain that has been ongoing since at least the past year. Notably, she was having a soft BM daily and in  clinical remission after budesonide course and taper for collagenous colitis, so I don't feel we are dealing with a flare of this.   As diarrhea has resolved, unable to collect stool studies and not consistent with ongoing infection. Antibiotics not indicated at this point.  With known PUD, would recommend surveillance EGD as previously has been requested (last in Feb 2024). She continues to smoke although "decreasing".  Upon review of chart, she does have a postprandial RUQ abdominal pain component and unable to rule out a biliary etiology underlying, even without stones on [place]. Could be dealing with biliary dyskinesia and may benefit from surgical referral. Patient expressed to me she was quite worried about her gallbladder and desires to see a Careers adviser.  We will arrange EGD with Dr. Levon Hedger in the near future.  Continue Dexilant and Carafate STOP budesonide. She was asked not to start this on her own Smoking cessation again discussed. NSAIDs avoidance Consider surgical referral for elective cholecystectomy Patient would benefit from in-person evaluation for abdominal exam in the future if symptoms worsen in interim between now and EGD    Follow Up Instructions:    I discussed the assessment and treatment plan with the patient. The patient was provided an opportunity to ask questions and all were answered. The patient agreed with the plan and demonstrated an understanding of the instructions.   The patient was advised to call back or seek an in-person evaluation if the symptoms worsen or if the condition fails to improve as anticipated.  I provided 15 minutes of telephone time during this encounter.  Gelene Mink, PhD, ANP-BC [facility] Gastroenterology   I have reviewed the note and agree  with the APP's assessment as described in this progress note  [name], MD Gastroenterology and Hepatology [facility] Gastroenterology

## 2023-07-16 NOTE — H&P (View-Only) (Signed)
Primary Care Physician:  Ignatius Specking, MD  Primary GI: Dr. Levon Hedger  Patient Location: Home   Provider Location: [facility] office   Reason for Visit: Abdominal pain    Persons present on the virtual encounter, with roles: patient and NP   Total time (minutes) spent on medical discussion: #15 minutes   Telephone note Due to COVID-19, visit is conducted virtually and was requested by patient.   I connected with [name] on 07/16/23 at  8:00 AM EDT by telephone and verified that I am speaking with the correct person using two identifiers.   I discussed the limitations, risks, security and privacy concerns of performing an evaluation and management service by telephone and the availability of in person appointments. I also discussed with the patient that there may be a patient responsible charge related to this service. The patient expressed understanding and agreed to proceed.  Chief Complaint  Patient presents with   Hospitalization Follow-up    Patient doing a phone call today. ED follow up on colitis. Patient states she is unable to take cipro and flagyl given to her by ED. States it makes her sick. She is currently on budesonide 3mg  one bid.      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   She states she ate a taco salad over the weekend of the 12th/13th. Started to have RUQ pain wrapping around back and into shoulder. Had looser stool , about 4 loose stools in 24 hours prior to presenting to the ED. She presented to the ED and CT abd/pelvis with contrast showed wall thickening of the distal descending and sigmoid colon without significant associated inflammatory changes that could related to midl nonspecific colitis. No  rectal bleeding. Prescribed cipro and flagyl. Has nausea when taking. Didn't take any. Denies any diarrhea now. Was short lived. No diarrhea. Soft BM daily now. Pain still present. No fever or chills. No urinary symptoms. Pain comes and goes. Eating broth.   Cutting back with smoking to less than a pack per day. No NSAIDs. Can't lay on right side. States right-sided pain has been going on for several months. Upon review of chart, RUQ pain has been long-standing. No allstones on [place] in past. HIDA in March 2024 at [facility] with normal EF 76%.   Started budesonide last week on her own and then stopped. She is concerned about her gallbladder.    Last EGD: 10/25/2022 1 cm hiatal hernia 110 mm cratered gastric ulcer was found in the gastric antrum (focal erosion and reactive gastropathy without H. pylori or intestinal metaplasia), 2 nonbleeding cratered duodenal ulcers in the first portion of the duodenum with largest measuring 15 mm, presence of a mild stenosis in the first portion of the duodenum, diverticulum in the second portion of the duodenum   Patient was scheduled for repeat EGD in 3 months but this was not performed.  Advised to stop using stimulatory medications and quit smoking. Still has not been performed.   Last Colonoscopy: 10/25/2022 Normal colon with biopsies consistent with collagenous colitis, internal hemorrhoids. Repeat colonoscopy in 10 years.  Past Medical History:  Diagnosis Date   Anxiety    Cyst of right kidney    Depression    Essential hypertension, benign 12/26/2016   Fibroadenoma of  right breast    S/P LUMPECTOMY W/ RADIOACTIVE SEED IMPLANT 05-19-2015   Fibromyalgia    GERD (gastroesophageal reflux disease)    History of cervical dysplasia    s/p conzation and recurrent s/p  vaginal hysterectomy 2008   History of duodenal ulcer    1992--  S/P  REPAIR PERFORATED ULCER   History of gastric ulcer    age 54   Hypertension    IBS (irritable bowel syndrome)    Vaginal Pap  smear, abnormal    VIN II (vulvar intraepithelial neoplasia II)    Wears glasses    Wears partial dentures    lower     Past Surgical History:  Procedure Laterality Date   BALLOON DILATION  12/21/2021   Procedure: BALLOON DILATION;  Surgeon: Dolores Frame, MD;  [facility];  Service: Gastroenterology;;   BIOPSY  12/19/2021   Procedure: BIOPSY;  Surgeon: Dolores Frame, MD;  [facility];  Service: Gastroenterology;;   BIOPSY  10/25/2022   Procedure: BIOPSY;  Surgeon: Dolores Frame, MD;  [facility];  Service: Gastroenterology;;   BLADDER SLING PROCEDURE  2005  approx.   CERVICAL CONIZATION W/BX  2006  approx   CO2 LASER APPLICATION N/A 06/13/2016   Procedure: CO2 LASER OF THE VAGINA;  Surgeon: Adolphus Birchwood, MD;  Location[facility];  Service: Gynecology;  Laterality: N/A;   COLONOSCOPY WITH PROPOFOL N/A 10/25/2022   Procedure: COLONOSCOPY WITH PROPOFOL;  Surgeon: Dolores Frame, MD;  [facility];  Service: Gastroenterology;  Laterality: N/A;  9:45am, asa 1-2   ESOPHAGEAL DILATION N/A 06/14/2020   Procedure: ESOPHAGEAL DILATION;  Surgeon: Malissa Hippo, MD;  [facility];  Service: Endoscopy;  Laterality: N/A;   ESOPHAGEAL DILATION N/A 10/04/2020   Procedure: ESOPHAGEAL DILATION;  Surgeon: Malissa Hippo, MD;  [facility];  Service: Endoscopy;  Laterality: N/A;   ESOPHAGOGASTRODUODENOSCOPY N/A 01/21/2017   Procedure: ESOPHAGOGASTRODUODENOSCOPY (EGD);  Surgeon: Malissa Hippo, MD;  [facility];  Service: Endoscopy;  Laterality: N/A;   ESOPHAGOGASTRODUODENOSCOPY N/A 07/17/2017   Procedure: ESOPHAGOGASTRODUODENOSCOPY (EGD);  Surgeon: Malissa Hippo, MD;  [facility];  Service: Endoscopy;  Laterality: N/A;  1:00   ESOPHAGOGASTRODUODENOSCOPY N/A 10/16/2017   Procedure: ESOPHAGOGASTRODUODENOSCOPY (EGD)WITH DILATION;  Surgeon: Malissa Hippo,  MD;  [facility];  Service: Endoscopy;  Laterality: N/A;  1030   ESOPHAGOGASTRODUODENOSCOPY (EGD) WITH PROPOFOL N/A 01/23/2017   Procedure: ESOPHAGOGASTRODUODENOSCOPY (EGD) WITH PROPOFOL;  Surgeon: Malissa Hippo, MD;  [facility];  Service: Endoscopy;  Laterality: N/A;  duodenal striture dilation   ESOPHAGOGASTRODUODENOSCOPY (EGD) WITH PROPOFOL N/A 06/14/2020   Procedure: ESOPHAGOGASTRODUODENOSCOPY (EGD) WITH PROPOFOL;  Surgeon: Malissa Hippo, MD;  [facility];  Service: Endoscopy;  Laterality: N/A;  1055   ESOPHAGOGASTRODUODENOSCOPY (EGD) WITH PROPOFOL N/A 10/04/2020   Procedure: ESOPHAGOGASTRODUODENOSCOPY (EGD) WITH PROPOFOL;  Surgeon: Malissa Hippo, MD;  [facility];  Service: Endoscopy;  Laterality: N/A;  1:00   ESOPHAGOGASTRODUODENOSCOPY (EGD) WITH PROPOFOL N/A 12/19/2021   Procedure: ESOPHAGOGASTRODUODENOSCOPY (EGD) WITH PROPOFOL;  Surgeon: Dolores Frame, MD;  [facility];  Service: Gastroenterology;  Laterality: N/A;  815   ESOPHAGOGASTRODUODENOSCOPY (EGD) WITH PROPOFOL N/A 12/21/2021   Procedure: ESOPHAGOGASTRODUODENOSCOPY (EGD) WITH PROPOFOL;  Surgeon: Dolores Frame, MD;  [facility];  Service: Gastroenterology;  Laterality: N/A;  205 ASA 1   ESOPHAGOGASTRODUODENOSCOPY (EGD) WITH PROPOFOL N/A  10/25/2022   Procedure: ESOPHAGOGASTRODUODENOSCOPY (EGD) WITH PROPOFOL;  Surgeon: Dolores Frame, MD;  [facility];  Service: Gastroenterology;  Laterality: N/A;   LAPAROSCOPY N/A 12/19/2017   Procedure: LAPAROSCOPY, PYLOROPLASTY WITH Peggye Ley;  Surgeon: Luretha Murphy, MD;  Location: [facility];  Service: General;  Laterality: N/A;   NASAL SEPTOPLASTY W/ TURBINOPLASTY Bilateral 04/30/2019   Procedure: BILATERAL NASAL SEPTOPLASTY WITH TURBINATE REDUCTION;  Surgeon: Newman Pies, MD;  Location: [facility];  Service: ENT;  Laterality: Bilateral;   RADIOACTIVE SEED GUIDED  EXCISIONAL BREAST BIOPSY Right 05/19/2015   Procedure: EXCISIONAL RIGHT BREAST MASS WITH RADIOACTIVE SEED LOCALIZATION;  Surgeon: Claud Kelp, MD;  Location: [facility];  Service: General;  Laterality: Right;   REPAIR OF PERFORATED ULCER  1992   duodenal   VAGINAL HYSTERECTOMY  11/13/2006     Current Meds  Medication Sig   ALPRAZolam (XANAX) 0.25 MG tablet Take 0.25 mg by mouth 2 (two) times daily as needed for anxiety.   alum & mag hydroxide-simeth (MAALOX/MYLANTA) 200-200-20 MG/5ML suspension Take 15 mLs by mouth every 6 (six) hours as needed for indigestion or heartburn.   amLODipine (NORVASC) 10 MG tablet Take 10 mg by mouth daily as needed (High blood pressure).   budesonide (ENTOCORT EC) 3 MG 24 hr capsule Take by mouth daily. One bid   dexlansoprazole (DEXILANT) 60 MG capsule Take 1 capsule (60 mg total) by mouth daily.   estradiol (VIVELLE-DOT) 0.1 MG/24HR patch PLACE ONE PATCH ONTO SKIN TWICE A WEEK   fluticasone (FLONASE) 50 MCG/ACT nasal spray Place 2 sprays into both nostrils daily as needed for allergies.   hyoscyamine (LEVSIN SL) 0.125 MG SL tablet Place 1 tablet (0.125 mg total) under the tongue every 8 (eight) hours as needed for cramping (abdominal pain).   Multiple Vitamin (MULTIVITAMIN WITH MINERALS) TABS tablet Take 1 tablet by mouth daily.   ondansetron (ZOFRAN) 4 MG tablet Take 1 tablet (4 mg total) by mouth every 6 (six) hours. As needed for nausea vomiting   potassium chloride (MICRO-K) 10 MEQ CR capsule Take 10 mEq by mouth daily.   sucralfate (CARAFATE) 1 g tablet TAKE 1 TABLET BY MOUTH FOUR TIMES DAILY (WITH MEALS AND AT BEDTIME)   traMADol (ULTRAM) 50 MG tablet Take 50 mg by mouth every 6 (six) hours as needed for moderate pain.    valACYclovir (VALTREX) 1000 MG tablet Take 1,000 mg by mouth daily as needed (Flair -up).     Family History  Problem Relation Age of Onset   Clotting disorder Father    Diabetes Father    Hypertension Father     Hypertension Mother    Kidney failure Mother    Hypertension Brother    Liver cancer Brother    Other Brother        cyst on kidney   Hypertension Sister    Hypertension Maternal Aunt    Hypertension Maternal Uncle    Hypertension Paternal Aunt    Hypertension Paternal Uncle     Social History   Socioeconomic History   Marital status: Divorced    Spouse name: Not on file   Number of children: Not on file   Years of education: Not on file   Highest education level: Not on file  Occupational History   Not on file  Tobacco Use   Smoking status: Every Day    Current packs/day: 1.00    Average packs/day: 1 pack/day for 18.0 years (18.0 ttl pk-yrs)  Types: Cigarettes    Passive exposure: Current   Smokeless tobacco: Never  Vaping Use   Vaping status: Never Used  Substance and Sexual Activity   Alcohol use: No   Drug use: No   Sexual activity: Not Currently    Birth control/protection: Surgical    Comment: hyst  Other Topics Concern   Not on file  Social History Narrative   Not on file   Social Determinants of Health   Financial Resource Strain: High Risk (12/27/2019)   Overall Financial Resource Strain (CARDIA)    Difficulty of Paying Living Expenses: Hard  Food Insecurity: No Food Insecurity (12/27/2019)   Hunger Vital Sign    Worried About Running Out of Food in the Last Year: Never true    Ran Out of Food in the Last Year: Never true  Transportation Needs: No Transportation Needs (12/27/2019)   PRAPARE - Administrator, Civil Service (Medical): No    Lack of Transportation (Non-Medical): No  Physical Activity: Inactive (12/27/2019)   Exercise Vital Sign    Days of Exercise per Week: 0 days    Minutes of Exercise per Session: 10 min  Stress: Stress Concern Present (12/27/2019)   [employer] of Occupational Health - Occupational Stress Questionnaire    Feeling of Stress : Very much  Social Connections: Socially Isolated (12/27/2019)   Social  Connection and Isolation Panel [NHANES]    Frequency of Communication with Friends and Family: Twice a week    Frequency of Social Gatherings with Friends and Family: Never    Attends Religious Services: Never    Database administrator or Organizations: No    Attends Engineer, structural: Never    Marital Status: Divorced       Review of Systems: Gen: Denies fever, chills, anorexia. Denies fatigue, weakness, weight loss.  CV: Denies chest pain, palpitations, syncope, peripheral edema, and claudication. Resp: Denies dyspnea at rest, cough, wheezing, coughing up blood, and pleurisy. GI: see HPI Derm: Denies rash, itching, dry skin Psych: Denies depression, anxiety, memory loss, confusion. No homicidal or suicidal ideation.  Heme: Denies bruising, bleeding, and enlarged lymph nodes.  Lab Results  Component Value Date   WBC 14.4 (H) 07/08/2023   HGB 14.5 07/08/2023   HCT 44.9 07/08/2023   MCV 92.6 07/08/2023   PLT 417 (H) 07/08/2023   Lab Results  Component Value Date   LIPASE 30 07/08/2023   Lab Results  Component Value Date   ALT 14 07/08/2023   AST 19 07/08/2023   ALKPHOS 77 07/08/2023   BILITOT 0.4 07/08/2023     Observations/Objective: No distress. 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   Difficult to ascertain exact location of pain as this visit was over the phone, but after much questioning, she does attribute acute on chronic pain in RUQ that radiates around back and up into shoulder. Lipase, LFTs normal. No known gallstones, and HIDA previously normal. CT with non-specific colitis but she had self-limiting diarrhea that is now resolved. I  suspect she may have had a self-limiting gastroenteritis but now continues with right-sided pain that has been ongoing since at least the past year. Notably, she was having a soft BM daily and in  clinical remission after budesonide course and taper for collagenous colitis, so I don't feel we are dealing with a flare of this.   As diarrhea has resolved, unable to collect stool studies and not consistent with ongoing infection. Antibiotics not indicated at this point.  With known PUD, would recommend surveillance EGD as previously has been requested (last in Feb 2024). She continues to smoke although "decreasing".  Upon review of chart, she does have a postprandial RUQ abdominal pain component and unable to rule out a biliary etiology underlying, even without stones on [place]. Could be dealing with biliary dyskinesia and may benefit from surgical referral. Patient expressed to me she was quite worried about her gallbladder and desires to see a Careers adviser.  We will arrange EGD with Dr. Levon Hedger in the near future.  Continue Dexilant and Carafate STOP budesonide. She was asked not to start this on her own Smoking cessation again discussed. NSAIDs avoidance Consider surgical referral for elective cholecystectomy Patient would benefit from in-person evaluation for abdominal exam in the future if symptoms worsen in interim between now and EGD    Follow Up Instructions:    I discussed the assessment and treatment plan with the patient. The patient was provided an opportunity to ask questions and all were answered. The patient agreed with the plan and demonstrated an understanding of the instructions.   The patient was advised to call back or seek an in-person evaluation if the symptoms worsen or if the condition fails to improve as anticipated.  I provided 15 minutes of telephone time during this encounter.  Gelene Mink, PhD, ANP-BC [facility] Gastroenterology   I have reviewed the note and agree  with the APP's assessment as described in this progress note  [name], MD Gastroenterology and Hepatology [facility] Gastroenterology

## 2023-07-17 ENCOUNTER — Ambulatory Visit (INDEPENDENT_AMBULATORY_CARE_PROVIDER_SITE_OTHER): Payer: Medicaid Other | Admitting: Gastroenterology

## 2023-07-17 ENCOUNTER — Encounter (INDEPENDENT_AMBULATORY_CARE_PROVIDER_SITE_OTHER): Payer: Self-pay | Admitting: Gastroenterology

## 2023-07-17 ENCOUNTER — Telehealth (INDEPENDENT_AMBULATORY_CARE_PROVIDER_SITE_OTHER): Payer: Self-pay | Admitting: *Deleted

## 2023-07-17 NOTE — Telephone Encounter (Signed)
Pt called and asked about referral to surgeon about her gallbladder.   [phone].

## 2023-07-17 NOTE — Telephone Encounter (Signed)
Please let her know we need to arrange an upper endoscopy. We can determine need for referral after the endoscopy.   Tanya: please arrange EGD with Castaneda, ASA 3, hx of PUD.

## 2023-07-17 NOTE — Progress Notes (Deleted)
GI Office Note    Referring Provider: Ignatius Specking, MD Primary Care Physician:  Ignatius Specking, MD Primary Gastroenterologist: ***  Date:  07/17/2023  ID:  PRITI MCNULTY, DOB May 25, 1966, MRN [id number]   Chief Complaint   No chief complaint on file.  History of Present Illness  RENAD DUNKERLEY is a 57 y.o. female with a history of *** presenting today with complaint of     Current Outpatient Medications  Medication Sig Dispense Refill   ALPRAZolam (XANAX) 0.25 MG tablet Take 0.25 mg by mouth 2 (two) times daily as needed for anxiety.     alum & mag hydroxide-simeth (MAALOX/MYLANTA) 200-200-20 MG/5ML suspension Take 15 mLs by mouth every 6 (six) hours as needed for indigestion or heartburn.     amLODipine (NORVASC) 10 MG tablet Take 10 mg by mouth daily as needed (High blood pressure).     budesonide (ENTOCORT EC) 3 MG 24 hr capsule Take by mouth daily. One bid     dexlansoprazole (DEXILANT) 60 MG capsule Take 1 capsule (60 mg total) by mouth daily. 90 capsule 3   estradiol (VIVELLE-DOT) 0.1 MG/24HR patch PLACE ONE PATCH ONTO SKIN TWICE A WEEK 8 patch 6   fluticasone (FLONASE) 50 MCG/ACT nasal spray Place 2 sprays into both nostrils daily as needed for allergies.     hyoscyamine (LEVSIN SL) 0.125 MG SL tablet Place 1 tablet (0.125 mg total) under the tongue every 8 (eight) hours as needed for cramping (abdominal pain). 90 tablet 2   Multiple Vitamin (MULTIVITAMIN WITH MINERALS) TABS tablet Take 1 tablet by mouth daily.     ondansetron (ZOFRAN) 4 MG tablet Take 1 tablet (4 mg total) by mouth every 6 (six) hours. As needed for nausea vomiting 12 tablet 0   potassium chloride (MICRO-K) 10 MEQ CR capsule Take 10 mEq by mouth daily.     sucralfate (CARAFATE) 1 g tablet TAKE 1 TABLET BY MOUTH FOUR TIMES DAILY (WITH MEALS AND AT BEDTIME) 120 tablet 0   traMADol (ULTRAM) 50 MG tablet Take 50 mg by mouth every 6 (six) hours as needed for moderate pain.      valACYclovir (VALTREX) 1000 MG  tablet Take 1,000 mg by mouth daily as needed (Flair -up).  5   No current facility-administered medications for this visit.    Past Medical History:  Diagnosis Date   Anxiety    Cyst of right kidney    Depression    Essential hypertension, benign 12/26/2016   Fibroadenoma of right breast    S/P LUMPECTOMY W/ RADIOACTIVE SEED IMPLANT 05-19-2015   Fibromyalgia    GERD (gastroesophageal reflux disease)    History of cervical dysplasia    s/p conzation and recurrent s/p  vaginal hysterectomy 2008   History of duodenal ulcer    1992--  S/P  REPAIR PERFORATED ULCER   History of gastric ulcer    age 41   Hypertension    IBS (irritable bowel syndrome)    Vaginal Pap smear, abnormal    VIN II (vulvar intraepithelial neoplasia II)    Wears glasses    Wears partial dentures    lower    Past Surgical History:  Procedure Laterality Date   BALLOON DILATION  12/21/2021   Procedure: BALLOON DILATION;  Surgeon: Dolores Frame, MD;  [facility];  Service: Gastroenterology;;   BIOPSY  12/19/2021   Procedure: BIOPSY;  Surgeon: Dolores Frame, MD;  [facility];  Service: Gastroenterology;;  BIOPSY  10/25/2022   Procedure: BIOPSY;  Surgeon: Dolores Frame, MD;  [facility];  Service: Gastroenterology;;   BLADDER SLING PROCEDURE  2005  approx.   CERVICAL CONIZATION W/BX  2006  approx   CO2 LASER APPLICATION N/A 06/13/2016   Procedure: CO2 LASER OF THE VAGINA;  Surgeon: Adolphus Birchwood, MD;  Location: [facility];  Service: Gynecology;  Laterality: N/A;   COLONOSCOPY WITH PROPOFOL N/A 10/25/2022   Procedure: COLONOSCOPY WITH PROPOFOL;  Surgeon: Dolores Frame, MD;  [facility];  Service: Gastroenterology;  Laterality: N/A;  9:45am, asa 1-2   ESOPHAGEAL DILATION N/A 06/14/2020   Procedure: ESOPHAGEAL DILATION;  Surgeon: Malissa Hippo, MD;  [facility];  Service: Endoscopy;  Laterality:  N/A;   ESOPHAGEAL DILATION N/A 10/04/2020   Procedure: ESOPHAGEAL DILATION;  Surgeon: Malissa Hippo, MD;  [facility];  Service: Endoscopy;  Laterality: N/A;   ESOPHAGOGASTRODUODENOSCOPY N/A 01/21/2017   Procedure: ESOPHAGOGASTRODUODENOSCOPY (EGD);  Surgeon: Malissa Hippo, MD;  [facility];  Service: Endoscopy;  Laterality: N/A;   ESOPHAGOGASTRODUODENOSCOPY N/A 07/17/2017   Procedure: ESOPHAGOGASTRODUODENOSCOPY (EGD);  Surgeon: Malissa Hippo, MD;  [facility];  Service: Endoscopy;  Laterality: N/A;  1:00   ESOPHAGOGASTRODUODENOSCOPY N/A 10/16/2017   Procedure: ESOPHAGOGASTRODUODENOSCOPY (EGD)WITH DILATION;  Surgeon: Malissa Hippo, MD;  [facility];  Service: Endoscopy;  Laterality: N/A;  1030   ESOPHAGOGASTRODUODENOSCOPY (EGD) WITH PROPOFOL N/A 01/23/2017   Procedure: ESOPHAGOGASTRODUODENOSCOPY (EGD) WITH PROPOFOL;  Surgeon: Malissa Hippo, MD;  [facility];  Service: Endoscopy;  Laterality: N/A;  duodenal striture dilation   ESOPHAGOGASTRODUODENOSCOPY (EGD) WITH PROPOFOL N/A 06/14/2020   Procedure: ESOPHAGOGASTRODUODENOSCOPY (EGD) WITH PROPOFOL;  Surgeon: Malissa Hippo, MD;  [facility];  Service: Endoscopy;  Laterality: N/A;  1055   ESOPHAGOGASTRODUODENOSCOPY (EGD) WITH PROPOFOL N/A 10/04/2020   Procedure: ESOPHAGOGASTRODUODENOSCOPY (EGD) WITH PROPOFOL;  Surgeon: Malissa Hippo, MD;  [facility];  Service: Endoscopy;  Laterality: N/A;  1:00   ESOPHAGOGASTRODUODENOSCOPY (EGD) WITH PROPOFOL N/A 12/19/2021   Procedure: ESOPHAGOGASTRODUODENOSCOPY (EGD) WITH PROPOFOL;  Surgeon: Dolores Frame, MD;  [facility];  Service: Gastroenterology;  Laterality: N/A;  815   ESOPHAGOGASTRODUODENOSCOPY (EGD) WITH PROPOFOL N/A 12/21/2021   Procedure: ESOPHAGOGASTRODUODENOSCOPY (EGD) WITH PROPOFOL;  Surgeon: Dolores Frame, MD;  [facility];  Service: Gastroenterology;   Laterality: N/A;  205 ASA 1   ESOPHAGOGASTRODUODENOSCOPY (EGD) WITH PROPOFOL N/A 10/25/2022   Procedure: ESOPHAGOGASTRODUODENOSCOPY (EGD) WITH PROPOFOL;  Surgeon: Dolores Frame, MD;  [facility];  Service: Gastroenterology;  Laterality: N/A;   LAPAROSCOPY N/A 12/19/2017   Procedure: LAPAROSCOPY, PYLOROPLASTY WITH Peggye Ley;  Surgeon: Luretha Murphy, MD;  Location: [facility];  Service: General;  Laterality: N/A;   NASAL SEPTOPLASTY W/ TURBINOPLASTY Bilateral 04/30/2019   Procedure: BILATERAL NASAL SEPTOPLASTY WITH TURBINATE REDUCTION;  Surgeon: Newman Pies, MD;  Location: [facility];  Service: ENT;  Laterality: Bilateral;   RADIOACTIVE SEED GUIDED EXCISIONAL BREAST BIOPSY Right 05/19/2015   Procedure: EXCISIONAL RIGHT BREAST MASS WITH RADIOACTIVE SEED LOCALIZATION;  Surgeon: Claud Kelp, MD;  Location: [facility];  Service: General;  Laterality: Right;   REPAIR OF PERFORATED ULCER  1992   duodenal   VAGINAL HYSTERECTOMY  11/13/2006    Family History  Problem Relation Age of Onset   Clotting disorder Father    Diabetes Father    Hypertension Father  Hypertension Mother    Kidney failure Mother    Hypertension Brother    Liver cancer Brother    Other Brother        cyst on kidney   Hypertension Sister    Hypertension Maternal Aunt    Hypertension Maternal Uncle    Hypertension Paternal Aunt    Hypertension Paternal Uncle     Allergies as of 07/17/2023 - Review Complete 07/15/2023  Allergen Reaction Noted   Codeine Nausea Only 11/11/2017   Hydrocodone Nausea Only 05/15/2015   Sulfa antibiotics Hives 09/29/2013   Penicillin v Nausea Only 06/04/2018    Social History   Socioeconomic History   Marital status: Divorced    Spouse name: Not on file   Number of children: Not on file   Years of education: Not on file   Highest education level: Not on file  Occupational History   Not on file  Tobacco Use   Smoking status: Every  Day    Current packs/day: 1.00    Average packs/day: 1 pack/day for 18.0 years (18.0 ttl pk-yrs)    Types: Cigarettes    Passive exposure: Current   Smokeless tobacco: Never  Vaping Use   Vaping status: Never Used  Substance and Sexual Activity   Alcohol use: No   Drug use: No   Sexual activity: Not Currently    Birth control/protection: Surgical    Comment: hyst  Other Topics Concern   Not on file  Social History Narrative   Not on file   Social Determinants of Health   Financial Resource Strain: High Risk (12/27/2019)   Overall Financial Resource Strain (CARDIA)    Difficulty of Paying Living Expenses: Hard  Food Insecurity: No Food Insecurity (12/27/2019)   Hunger Vital Sign    Worried About Running Out of Food in the Last Year: Never true    Ran Out of Food in the Last Year: Never true  Transportation Needs: No Transportation Needs (12/27/2019)   PRAPARE - Administrator, Civil Service (Medical): No    Lack of Transportation (Non-Medical): No  Physical Activity: Inactive (12/27/2019)   Exercise Vital Sign    Days of Exercise per Week: 0 days    Minutes of Exercise per Session: 10 min  Stress: Stress Concern Present (12/27/2019)   [employer] of Occupational Health - Occupational Stress Questionnaire    Feeling of Stress : Very much  Social Connections: Socially Isolated (12/27/2019)   Social Connection and Isolation Panel [NHANES]    Frequency of Communication with Friends and Family: Twice a week    Frequency of Social Gatherings with Friends and Family: Never    Attends Religious Services: Never    Database administrator or Organizations: No    Attends Engineer, structural: Never    Marital Status: Divorced     Review of Systems   Gen: Denies fever, chills, anorexia. Denies fatigue, weakness, weight loss.  CV: Denies chest pain, palpitations, syncope, peripheral edema, and claudication. Resp: Denies dyspnea at rest, cough, wheezing, coughing  up blood, and pleurisy. GI: See HPI Derm: Denies rash, itching, dry skin Psych: Denies depression, anxiety, memory loss, confusion. No homicidal or suicidal ideation.  Heme: Denies bruising, bleeding, and enlarged lymph nodes.   Physical Exam   There were no vitals taken for this visit.  General:   Alert and oriented. No distress noted. Pleasant and cooperative.  Head:  Normocephalic and atraumatic. Eyes:  Conjuctiva clear without scleral icterus. Mouth:  Oral mucosa pink and moist. Good dentition. No lesions. Lungs:  Clear to auscultation bilaterally. No wheezes, rales, or rhonchi. No distress.  Heart:  S1, S2 present without murmurs appreciated.  Abdomen:  +BS, soft, non-tender and non-distended. No rebound or guarding. No HSM or masses noted. Rectal: *** Msk:  Symmetrical without gross deformities. Normal posture. Extremities:  Without edema. Neurologic:  Alert and  oriented x4 Psych:  Alert and cooperative. Normal mood and affect.   Assessment  DAVEDA PICHON is a 57 y.o. female with a history of *** presenting today with    PLAN   ***     Brooke Bonito, MSN, FNP-BC, AGACNP-BC [facility]

## 2023-07-17 NOTE — Telephone Encounter (Signed)
I spoke with the patient and made her aware,  Please let her know we need to arrange an upper endoscopy. We can determine need for referral after the endoscopy.    Tanya: please arrange EGD with Castaneda, ASA 3, hx of PUD.   She is aware that Kenney Houseman will be reaching out to her.

## 2023-07-17 NOTE — Patient Instructions (Signed)
It is important to stop smoking, as we discussed. Avoid Ibuprofen, Advil, Aleve, Motrin, etc.  Continue Dexilant daily.   We are arranging an upper endoscopy in the near future.  Please let us know if symptoms worsen!  It was a pleasure to see you today. I want to create trusting relationships with patients and provide genuine, compassionate, and quality care. I truly value your feedback, so please be on the lookout for a survey regarding your visit with me today. I appreciate your time in completing this!         Gelene Mink, PhD, ANP-BC [facility]

## 2023-07-18 NOTE — Telephone Encounter (Signed)
Left message to return call 

## 2023-07-21 NOTE — Telephone Encounter (Signed)
[facility] left voicemail in regards to pt EGD being scheduled. Contacted pt to schedule. Pt scheduled for 07/29/23. Instructions sent via my chart. PA approved via [facility]. Will send my chart message with pre op appt.

## 2023-07-22 ENCOUNTER — Encounter (INDEPENDENT_AMBULATORY_CARE_PROVIDER_SITE_OTHER): Payer: Self-pay

## 2023-07-22 NOTE — Telephone Encounter (Signed)
Dolores Frame, MD  Marlowe Shores, LPN She can be scheduled in room 1, no need for room 3. Please schedule with me, I do not want Dr Tasia Catchings to have a difficult situation with this patient       Previous Messages    ----- Message ----- From: Marlowe Shores, LPN Sent: 16/06/9603   4:53 PM EDT To: Dolores Frame, MD  She is a room 3 per Tobi Bastos. Your next available is 08/12/23. Pt states she can't wait that long because she has an infection in her stomach and she was told not to take any antibiotics. She has a chain of my chart messages saying to not worry about scheduling anything. Could she be put with Dr.Ahmed since he has an opening on 07/31/23 ----- Message ----- From: Dolores Frame, MD Sent: 07/21/2023   4:45 PM EDT To: Gelene Mink, NP; Marlowe Shores, LPN  What about on 11/13? Or any room that is available earlier. Unfortunately, we cannot schedule anyone after 3 PM. Why is she upset? ----- Message ----- From: Marlowe Shores, LPN Sent: 54/05/8118   3:51 PM EDT To: Gelene Mink, NP; Dolores Frame, MD  Do I need to reschedule her? If I reschedule, it will be late November and she is already upset ----- Message ----- From: Nobie Putnam Sent: 07/21/2023   2:28 PM EDT To: Marlowe Shores, LPN  This case will not fit on 11/5, it wouldn't start until 3:00

## 2023-07-22 NOTE — Telephone Encounter (Signed)
Pt replied to my chart that she can have procedure done tomorrow. Message sent to endo. Updated instructions sent via my chart

## 2023-07-22 NOTE — Telephone Encounter (Signed)
Mychart message sent to patient.

## 2023-07-23 ENCOUNTER — Ambulatory Visit (HOSPITAL_COMMUNITY): Payer: Medicaid Other | Admitting: Anesthesiology

## 2023-07-23 ENCOUNTER — Telehealth: Payer: Self-pay | Admitting: Gastroenterology

## 2023-07-23 ENCOUNTER — Encounter (INDEPENDENT_AMBULATORY_CARE_PROVIDER_SITE_OTHER): Payer: Self-pay | Admitting: Gastroenterology

## 2023-07-23 ENCOUNTER — Other Ambulatory Visit: Payer: Self-pay

## 2023-07-23 ENCOUNTER — Encounter (HOSPITAL_COMMUNITY): Payer: Self-pay | Admitting: Gastroenterology

## 2023-07-23 ENCOUNTER — Encounter (HOSPITAL_COMMUNITY)
Admission: RE | Disposition: A | Discharge status: Home / Self Care | Payer: Self-pay | Source: Home / Self Care | Attending: Gastroenterology

## 2023-07-23 ENCOUNTER — Ambulatory Visit (HOSPITAL_COMMUNITY)
Admission: RE | Admit: 2023-07-23 | Discharge: 2023-07-23 | Disposition: A | Discharge status: Home / Self Care | Payer: Medicaid Other | Attending: Gastroenterology | Admitting: Gastroenterology

## 2023-07-23 DIAGNOSIS — K259 Gastric ulcer, unspecified as acute or chronic, without hemorrhage or perforation: Secondary | ICD-10-CM | POA: Insufficient documentation

## 2023-07-23 DIAGNOSIS — R1011 Right upper quadrant pain: Secondary | ICD-10-CM | POA: Diagnosis present

## 2023-07-23 DIAGNOSIS — F419 Anxiety disorder, unspecified: Secondary | ICD-10-CM | POA: Insufficient documentation

## 2023-07-23 DIAGNOSIS — F32A Depression, unspecified: Secondary | ICD-10-CM | POA: Insufficient documentation

## 2023-07-23 DIAGNOSIS — K219 Gastro-esophageal reflux disease without esophagitis: Secondary | ICD-10-CM | POA: Diagnosis not present

## 2023-07-23 DIAGNOSIS — F17210 Nicotine dependence, cigarettes, uncomplicated: Secondary | ICD-10-CM | POA: Diagnosis not present

## 2023-07-23 DIAGNOSIS — K589 Irritable bowel syndrome without diarrhea: Secondary | ICD-10-CM | POA: Diagnosis not present

## 2023-07-23 DIAGNOSIS — K269 Duodenal ulcer, unspecified as acute or chronic, without hemorrhage or perforation: Secondary | ICD-10-CM | POA: Diagnosis not present

## 2023-07-23 DIAGNOSIS — K449 Diaphragmatic hernia without obstruction or gangrene: Secondary | ICD-10-CM | POA: Diagnosis not present

## 2023-07-23 DIAGNOSIS — I10 Essential (primary) hypertension: Secondary | ICD-10-CM | POA: Insufficient documentation

## 2023-07-23 DIAGNOSIS — Z8711 Personal history of peptic ulcer disease: Secondary | ICD-10-CM | POA: Insufficient documentation

## 2023-07-23 DIAGNOSIS — K3189 Other diseases of stomach and duodenum: Secondary | ICD-10-CM

## 2023-07-23 HISTORY — PX: ESOPHAGOGASTRODUODENOSCOPY (EGD) WITH PROPOFOL: SHX5813

## 2023-07-23 HISTORY — PX: BIOPSY: SHX5522

## 2023-07-23 SURGERY — ESOPHAGOGASTRODUODENOSCOPY (EGD) WITH PROPOFOL
Anesthesia: General

## 2023-07-23 MED ORDER — ONDANSETRON HCL 4 MG/2ML IJ SOLN
INTRAMUSCULAR | Status: DC | PRN
  Administered 2023-07-23: 4 mg via INTRAVENOUS

## 2023-07-23 MED ORDER — DEXAMETHASONE SODIUM PHOSPHATE 10 MG/ML IJ SOLN
INTRAMUSCULAR | Status: DC | PRN
  Administered 2023-07-23: 10 mg via INTRAVENOUS

## 2023-07-23 MED ORDER — SODIUM CHLORIDE 0.9% FLUSH: 10.0000 mL | Freq: Two times a day (BID) | INTRAVENOUS | Status: DC

## 2023-07-23 MED ORDER — PROPOFOL 500 MG/50ML IV EMUL
INTRAVENOUS | Status: DC | PRN
  Administered 2023-07-23: 150 ug/kg/min via INTRAVENOUS

## 2023-07-23 MED ORDER — LIDOCAINE HCL (PF) 2 % IJ SOLN
INTRAMUSCULAR | Status: AC
  Filled 2023-07-23: qty 5

## 2023-07-23 MED ORDER — PROPOFOL 10 MG/ML IV BOLUS
INTRAVENOUS | Status: DC | PRN
  Administered 2023-07-23: 80 mg via INTRAVENOUS
  Administered 2023-07-23: 40 mg via INTRAVENOUS

## 2023-07-23 MED ORDER — LIDOCAINE HCL (CARDIAC) PF 100 MG/5ML IV SOSY
PREFILLED_SYRINGE | INTRAVENOUS | Status: DC | PRN
  Administered 2023-07-23: 100 mg via INTRAVENOUS

## 2023-07-23 MED ORDER — LACTATED RINGERS IV SOLN: INTRAVENOUS | Status: DC | PRN

## 2023-07-23 MED ORDER — PROPOFOL 500 MG/50ML IV EMUL
INTRAVENOUS | Status: AC
  Filled 2023-07-23: qty 50

## 2023-07-23 NOTE — Anesthesia Preprocedure Evaluation (Signed)
Anesthesia Evaluation  Patient identified by MRN, date of birth, ID band Patient awake    Reviewed: Allergy & Precautions, H&P , NPO status , Patient's Chart, lab work & pertinent test results, reviewed documented beta blocker date and time   Airway Mallampati: II  TM Distance: >3 FB Neck ROM: full    Dental no notable dental hx. (+) Partial Upper, Partial Lower   Pulmonary neg pulmonary ROS, Current Smoker   Pulmonary exam normal breath sounds clear to auscultation       Cardiovascular Exercise Tolerance: Good hypertension, negative cardio ROS  Rhythm:regular Rate:Normal     Neuro/Psych  PSYCHIATRIC DISORDERS Anxiety Depression     Neuromuscular disease negative neurological ROS  negative psych ROS   GI/Hepatic negative GI ROS, Neg liver ROS, PUD,GERD  ,,  Endo/Other  negative endocrine ROS    Renal/GU Renal diseasenegative Renal ROS  negative genitourinary   Musculoskeletal   Abdominal   Peds  Hematology negative hematology ROS (+)   Anesthesia Other Findings   Reproductive/Obstetrics negative OB ROS                             Anesthesia Physical Anesthesia Plan  ASA: 2  Anesthesia Plan: General   Post-op Pain Management:    Induction:   PONV Risk Score and Plan:   Airway Management Planned:   Additional Equipment:   Intra-op Plan:   Post-operative Plan:   Informed Consent: I have reviewed the patients History and Physical, chart, labs and discussed the procedure including the risks, benefits and alternatives for the proposed anesthesia with the patient or authorized representative who has indicated his/her understanding and acceptance.     Dental Advisory Given  Plan Discussed with: CRNA  Anesthesia Plan Comments:        Anesthesia Quick Evaluation

## 2023-07-23 NOTE — Op Note (Signed)
[facility] Patient Name: [name] Procedure Date: 07/23/2023 12:15 PM MRN: [id number] Date of Birth: 09/23/1966 Attending MD: [name] , , [id number] CSN: [id number] Age: 57 Admit Type: Outpatient Procedure:                Upper GI endoscopy Indications:              Abdominal pain in the right upper quadrant Providers:                [name], Angelica Ran, Elinor Parkinson Referring MD:              Medicines:                Monitored Anesthesia Care Complications:            No immediate complications. Estimated Blood Loss:     Estimated blood loss: none. Procedure:                Pre-Anesthesia Assessment:                           - Prior to the procedure, a History and Physical                            was performed, and patient medications, allergies                            and sensitivities were reviewed. The patient's                            tolerance of previous anesthesia was reviewed.                           - The risks and benefits of the procedure and the                            sedation options and risks were discussed with the                            patient. All questions were answered and informed                            consent was obtained.                           - ASA Grade Assessment: III - A patient with severe                            systemic disease.                           After obtaining informed consent, the endoscope was                            passed under direct vision. Throughout the                            procedure,  the patient's blood pressure, pulse, and                            oxygen saturations were monitored continuously. The                            [id number]-[id number] ([id number]) scope was introduced through the                            mouth, and advanced to the second part of duodenum.                            The upper GI endoscopy was accomplished without                             difficulty. The patient tolerated the procedure                            well. Scope In: 12:58:34 PM Scope Out: 1:09:41 PM Total Procedure Duration: 0 hours 11 minutes 7 seconds  Findings:      A 1 cm hiatal hernia was present.      One partially obstructing (about 50% obstructed) non-bleeding cratered       gastric ulcer of significant severity with a clean ulcer base (Forrest       Class III) was found in the gastric antrum. The lesion was 40 x20 mm in       largest dimension, and extended towards the proximal duodenum. There is       no evidence of perforation. Biopsies from stomach were taken with a cold       forceps for Helicobacter pylori testing. Biopsies were also taken from       the edges of the ulcer with a cold forceps for histology.      The first portion of the duodenum and second portion of the duodenum       were normal. Impression:               - 1 cm hiatal hernia.                           - Partially obstructing non-bleeding gastric ulcer                            with a clean ulcer base (Forrest Class III). There                            is no evidence of perforation. Biopsied.                           - Normal first portion of the duodenum and second                            portion of the duodenum. Moderate Sedation:      Per Anesthesia Care Recommendation:           - Discharge patient to home (ambulatory).                           -  Resume previous diet.                           - No aspirin, ibuprofen, naproxen, or other                            non-steroidal anti-inflammatory drugs.                           - Continue Dexilant 60 mg qday                           - Continue sucralfate 1 every 6 hours.                           - Await pathology results.                           - If negative results will consider empiric                            treatment of H.pylori.                           - Smoking cessation.                            - Repeat upper endoscopy in 3 months for                            surveillance. Procedure Code(s):        --- Professional ---                           [phone], Esophagogastroduodenoscopy, flexible,                            transoral; with biopsy, single or multiple Diagnosis Code(s):        --- Professional ---                           K44.9, Diaphragmatic hernia without obstruction or                            gangrene                           K25.9, Gastric ulcer, unspecified as acute or                            chronic, without hemorrhage or perforation                           R10.11, Right upper quadrant pain CPT copyright 2022 American Medical Association. All rights reserved. The codes documented in this report are preliminary and upon coder review may  be revised to meet current compliance requirements. [name], MD [name],  07/23/2023 1:19:30 PM This report has  been signed electronically. Number of Addenda: 0

## 2023-07-23 NOTE — Telephone Encounter (Signed)
Hi Dr. Lavon Paganini,    Patient is requesting to transfer her care specifically over to you. Previous transfer of care request was accepted on 11/27/22. Patient has recently been seen with [facility] and had a Endoscopy today at [facility]. Patient states she has been suffering from a ulcer for quite some time. Patient believes it may be a bleeding ulcer and has also been having RUQ abdominal pain. Patient states she has not been satisfied with the care she has been receiving and what she is being told from current provider. Patient stated she believes it also may her gallbladder causing her pain. Patient stated she also has not been able to eat and have rapidly been losing weight. Patients previous records are in [facility] for you to review and advise on scheduling.   Thank you.

## 2023-07-23 NOTE — OR Nursing (Signed)
Pt experiencing 8/10 right flank pain with nausea. Dr. Levon Hedger notified and aware. Massie Bougie, CRNA administered medications. Pt reports her nausea is better and she has nausea medications at home. Dr. Levon Hedger informed patient of interventions/medications for pain prior to discharge.

## 2023-07-23 NOTE — Transfer of Care (Addendum)
Immediate Anesthesia Transfer of Care Note  Patient: [name]  Procedure(s) Performed: ESOPHAGOGASTRODUODENOSCOPY (EGD) WITH PROPOFOL BIOPSY  Patient Location: PACU and Endoscopy Unit  Anesthesia Type:General  Level of Consciousness: drowsy and patient cooperative  Airway & Oxygen Therapy: Patient Spontanous Breathing and Patient connected to nasal cannula oxygen  Post-op Assessment: Report given to RN and Post -op Vital signs reviewed and stable  Post vital signs: Reviewed and stable  Last Vitals:  Vitals Value Taken Time  BP 135/9336 07/23/23   1315  Temp 36.6 07/23/23   1315  Pulse 94 07/23/23   1315  Resp 21 07/23/23   1315  SpO2 94% 07/23/23   1315    Last Pain:  Vitals:   07/23/23 1254  TempSrc:   PainSc: 6       Patients Stated Pain Goal: 4 (07/23/23 1115)  Complications:patient reporting nausea in Endoscopy recovery. Zofran and Dexamethason administered by CRNA.

## 2023-07-23 NOTE — Discharge Instructions (Signed)
You are being discharged to home.  Resume your previous diet.  Do not take any aspirin, ibuprofen (including Advil, Motrin or Nuprin), naproxen (including Aleve), or any other non-steroidal anti-inflammatory drugs.  We are waiting for your pathology results.  Your physician has recommended a repeat upper endoscopy in three months for surveillance.  Continue Dexilant 60 mg qday Continue sucralfate 1 every 6 hours. If negative results will consider empiric treatment of H.pylori. Smoking cessation.

## 2023-07-23 NOTE — Interval H&P Note (Signed)
History and Physical Interval Note:  07/23/2023 10:55 AM  [name]  has presented today for surgery, with the diagnosis of HX OF PUD.  The various methods of treatment have been discussed with the patient and family. After consideration of risks, benefits and other options for treatment, the patient has consented to  Procedure(s) with comments: ESOPHAGOGASTRODUODENOSCOPY (EGD) WITH PROPOFOL (N/A) - 2:45PM;ASA 1 per Tanya to 10/30 at 12:45 as a surgical intervention.  The patient's history has been reviewed, patient examined, no change in status, stable for surgery.  I have reviewed the patient's chart and labs.  Questions were answered to the patient's satisfaction.     [name] Mayorga

## 2023-07-24 ENCOUNTER — Other Ambulatory Visit (INDEPENDENT_AMBULATORY_CARE_PROVIDER_SITE_OTHER): Payer: Self-pay | Admitting: Gastroenterology

## 2023-07-24 DIAGNOSIS — K2970 Gastritis, unspecified, without bleeding: Secondary | ICD-10-CM

## 2023-07-24 DIAGNOSIS — K257 Chronic gastric ulcer without hemorrhage or perforation: Secondary | ICD-10-CM

## 2023-07-24 LAB — SURGICAL PATHOLOGY

## 2023-07-24 MED ORDER — TETRACYCLINE HCL 500 MG PO CAPS: 500.0000 mg | ORAL_CAPSULE | Freq: Four times a day (QID) | ORAL | 0 refills | Status: AC

## 2023-07-24 MED ORDER — BISMUTH 262 MG PO CHEW: 2.0000 | CHEWABLE_TABLET | Freq: Four times a day (QID) | ORAL | 0 refills | Status: DC

## 2023-07-24 MED ORDER — METRONIDAZOLE 500 MG PO TABS: 500.0000 mg | ORAL_TABLET | Freq: Three times a day (TID) | ORAL | 0 refills | Status: AC

## 2023-07-25 ENCOUNTER — Other Ambulatory Visit (HOSPITAL_COMMUNITY): Payer: Medicaid Other

## 2023-07-28 ENCOUNTER — Encounter (HOSPITAL_COMMUNITY): Payer: Self-pay | Admitting: Gastroenterology

## 2023-07-28 ENCOUNTER — Ambulatory Visit (INDEPENDENT_AMBULATORY_CARE_PROVIDER_SITE_OTHER): Payer: Medicaid Other | Admitting: Gastroenterology

## 2023-07-28 NOTE — Anesthesia Postprocedure Evaluation (Signed)
Anesthesia Post Note  Patient: [name]  Procedure(s) Performed: ESOPHAGOGASTRODUODENOSCOPY (EGD) WITH PROPOFOL BIOPSY  Patient location during evaluation: Phase II Anesthesia Type: General Level of consciousness: awake Pain management: pain level controlled Vital Signs Assessment: post-procedure vital signs reviewed and stable Respiratory status: spontaneous breathing and respiratory function stable Cardiovascular status: blood pressure returned to baseline and stable Postop Assessment: no headache and no apparent nausea or vomiting Anesthetic complications: no Comments: Late entry   No notable events documented.   Last Vitals:  Vitals:   07/23/23 1320 07/23/23 1325  BP: (!) 149/95 (!) 146/97  Pulse: 100 97  Resp: 17 14  Temp:    SpO2: 93% 93%    Last Pain:  Vitals:   07/23/23 1315  TempSrc: Oral  PainSc:                  [name]

## 2023-07-29 NOTE — Telephone Encounter (Signed)
Please inform patient that currently next available appointment for new patient is in March 2025, if she is fine with that please schedule appt

## 2023-07-31 NOTE — Telephone Encounter (Signed)
Called patient to advised on next available appointment. Left voicemail.

## 2023-08-05 ENCOUNTER — Other Ambulatory Visit (INDEPENDENT_AMBULATORY_CARE_PROVIDER_SITE_OTHER): Payer: Self-pay | Admitting: Gastroenterology

## 2023-08-06 NOTE — Telephone Encounter (Signed)
Last OV 03/31/23

## 2023-08-08 ENCOUNTER — Other Ambulatory Visit (INDEPENDENT_AMBULATORY_CARE_PROVIDER_SITE_OTHER): Payer: Self-pay | Admitting: Gastroenterology

## 2023-08-14 ENCOUNTER — Encounter (INDEPENDENT_AMBULATORY_CARE_PROVIDER_SITE_OTHER): Payer: Self-pay | Admitting: Gastroenterology

## 2023-08-16 ENCOUNTER — Encounter (INDEPENDENT_AMBULATORY_CARE_PROVIDER_SITE_OTHER): Payer: Self-pay | Admitting: Gastroenterology

## 2023-08-17 ENCOUNTER — Other Ambulatory Visit (INDEPENDENT_AMBULATORY_CARE_PROVIDER_SITE_OTHER): Payer: Self-pay | Admitting: Gastroenterology

## 2023-08-17 DIAGNOSIS — K257 Chronic gastric ulcer without hemorrhage or perforation: Secondary | ICD-10-CM

## 2023-08-17 DIAGNOSIS — B9681 Helicobacter pylori [H. pylori] as the cause of diseases classified elsewhere: Secondary | ICD-10-CM

## 2023-08-17 MED ORDER — METRONIDAZOLE 500 MG PO TABS: 500.0000 mg | ORAL_TABLET | Freq: Three times a day (TID) | ORAL | 0 refills | Status: AC

## 2023-08-17 MED ORDER — TETRACYCLINE HCL 500 MG PO CAPS: 500.0000 mg | ORAL_CAPSULE | Freq: Four times a day (QID) | ORAL | 0 refills | Status: AC

## 2023-08-17 MED ORDER — BISMUTH 262 MG PO CHEW: 2.0000 | CHEWABLE_TABLET | Freq: Four times a day (QID) | ORAL | 0 refills | Status: DC

## 2023-08-18 ENCOUNTER — Encounter: Payer: Self-pay | Admitting: Physician Assistant

## 2023-09-09 ENCOUNTER — Encounter (INDEPENDENT_AMBULATORY_CARE_PROVIDER_SITE_OTHER): Payer: Self-pay | Admitting: Gastroenterology

## 2023-09-10 ENCOUNTER — Encounter (INDEPENDENT_AMBULATORY_CARE_PROVIDER_SITE_OTHER): Payer: Self-pay | Admitting: *Deleted

## 2023-09-10 ENCOUNTER — Other Ambulatory Visit (INDEPENDENT_AMBULATORY_CARE_PROVIDER_SITE_OTHER): Payer: Self-pay | Admitting: Gastroenterology

## 2023-09-10 DIAGNOSIS — K2970 Gastritis, unspecified, without bleeding: Secondary | ICD-10-CM

## 2023-09-10 MED ORDER — DEXLANSOPRAZOLE 60 MG PO CPDR: 60.0000 mg | DELAYED_RELEASE_CAPSULE | Freq: Every day | ORAL | 3 refills | Status: DC

## 2023-09-15 ENCOUNTER — Other Ambulatory Visit (INDEPENDENT_AMBULATORY_CARE_PROVIDER_SITE_OTHER): Payer: Self-pay | Admitting: Gastroenterology

## 2023-09-15 DIAGNOSIS — K2970 Gastritis, unspecified, without bleeding: Secondary | ICD-10-CM

## 2023-09-15 NOTE — Telephone Encounter (Signed)
Per Path report on 07/23/2023:Given the severity of her ulceration and the fact her ulcer has worsened in size, we will give her an empiric course of antibiotics against H. pylori for 2 weeks with a quadruple bismuth regimen. As described in the past, she will continue with Dexilant twice a day for the next 2 months.

## 2023-09-18 ENCOUNTER — Telehealth (INDEPENDENT_AMBULATORY_CARE_PROVIDER_SITE_OTHER): Payer: Self-pay | Admitting: Gastroenterology

## 2023-09-18 NOTE — Telephone Encounter (Signed)
Per Dr.Castaneda She will need a repeat EGD at the last week of January.  Dx: gastric ulcer, room any  Left message to return call. Will also send my chart message

## 2023-09-18 NOTE — Telephone Encounter (Signed)
She will need a repeat EGD at the last week of January.  Dx: gastric ulcer, room any

## 2023-09-22 ENCOUNTER — Other Ambulatory Visit (INDEPENDENT_AMBULATORY_CARE_PROVIDER_SITE_OTHER): Payer: Self-pay | Admitting: Gastroenterology

## 2023-09-22 MED ORDER — SUCRALFATE 1 G PO TABS: 1.0000 g | ORAL_TABLET | Freq: Three times a day (TID) | ORAL | 0 refills | Status: DC

## 2023-09-22 NOTE — Telephone Encounter (Signed)
Left message to return call. No answer x2 will send letter

## 2023-09-30 ENCOUNTER — Other Ambulatory Visit: Payer: Self-pay | Admitting: Adult Health

## 2023-10-06 ENCOUNTER — Ambulatory Visit (INDEPENDENT_AMBULATORY_CARE_PROVIDER_SITE_OTHER): Payer: Medicaid Other | Admitting: Gastroenterology

## 2023-10-30 ENCOUNTER — Ambulatory Visit: Payer: No Typology Code available for payment source | Admitting: Orthopaedic Surgery

## 2023-11-06 ENCOUNTER — Ambulatory Visit: Payer: No Typology Code available for payment source | Admitting: Physician Assistant

## 2023-11-13 ENCOUNTER — Ambulatory Visit: Payer: Self-pay | Admitting: Orthopaedic Surgery

## 2023-11-21 ENCOUNTER — Ambulatory Visit: Payer: Medicaid Other | Admitting: Gastroenterology

## 2023-11-21 ENCOUNTER — Other Ambulatory Visit (INDEPENDENT_AMBULATORY_CARE_PROVIDER_SITE_OTHER): Payer: Self-pay | Admitting: Gastroenterology

## 2023-12-11 ENCOUNTER — Other Ambulatory Visit (INDEPENDENT_AMBULATORY_CARE_PROVIDER_SITE_OTHER): Payer: Self-pay

## 2023-12-11 ENCOUNTER — Telehealth (INDEPENDENT_AMBULATORY_CARE_PROVIDER_SITE_OTHER): Payer: Self-pay

## 2023-12-11 DIAGNOSIS — Z8711 Personal history of peptic ulcer disease: Secondary | ICD-10-CM

## 2023-12-11 DIAGNOSIS — K920 Hematemesis: Secondary | ICD-10-CM

## 2023-12-11 DIAGNOSIS — K257 Chronic gastric ulcer without hemorrhage or perforation: Secondary | ICD-10-CM

## 2023-12-11 DIAGNOSIS — R1013 Epigastric pain: Secondary | ICD-10-CM

## 2023-12-11 DIAGNOSIS — E876 Hypokalemia: Secondary | ICD-10-CM

## 2023-12-11 DIAGNOSIS — E440 Moderate protein-calorie malnutrition: Secondary | ICD-10-CM

## 2023-12-11 DIAGNOSIS — K2970 Gastritis, unspecified, without bleeding: Secondary | ICD-10-CM

## 2023-12-11 DIAGNOSIS — I10 Essential (primary) hypertension: Secondary | ICD-10-CM

## 2023-12-11 DIAGNOSIS — R1031 Right lower quadrant pain: Secondary | ICD-10-CM

## 2023-12-11 DIAGNOSIS — K269 Duodenal ulcer, unspecified as acute or chronic, without hemorrhage or perforation: Secondary | ICD-10-CM

## 2023-12-11 DIAGNOSIS — K567 Ileus, unspecified: Secondary | ICD-10-CM

## 2023-12-11 MED ORDER — DEXLANSOPRAZOLE 60 MG PO CPDR: 60.0000 mg | DELAYED_RELEASE_CAPSULE | Freq: Two times a day (BID) | ORAL | 2 refills | Status: DC

## 2023-12-11 NOTE — Telephone Encounter (Signed)
 Rx sent

## 2023-12-11 NOTE — Telephone Encounter (Signed)
 Extensive review of patient's chart as I have not seen her before. Patient last seen by our practice in 06/2023. She attempted to establish care at [facility] in the interim but she cancelled her appt today with 12/2023 appt with them.  Looks like the plans were for another EGD to verify ulcer healing but we could not reach patient and she did not respond to letter sent. She will need an ov prior to scheduling EGD since not seen in five months. OK to wait until 3/31 with Dr. Levon Hedger.I suspect her symptoms could be due to ulcer disease. I would suggest taking dexlansoprazole BID if she is not.  Ok to pursue HIDA. Await findings before decision about surgical referral because to date I'm not aware of any positive gallbladder findings. MRI Abd/MRCP 07/2023 with normal gallbladder, no stones. HIDA will complete gb work up.  We can do stat CBC, CMET, lipase.  If her pain is significant in the meantime, she should go to the ER.

## 2023-12-11 NOTE — Telephone Encounter (Signed)
 Patient states understanding of all and says she will go have labs drawn at [facility] in the morning, on 12/11/2023. She is aware if her symptoms worsen go to the Ed. She says she only has one Dexlansoprazole left and she tried getting this filled but, [facility] told her the insurance would not allow her to fill again until 12/14/2023. She would like to know if we can send in a script for this for bid until her appointment with Dr. Levon Hedger on 12/22/2023. Orders placed for Stat Labs at [facility].    I spoke with the patient and made her aware per Tana Coast,  She will need an ov prior to scheduling EGD since not seen in five months. OK to wait until 3/31 with Dr. Levon Hedger.I suspect her symptoms could be due to ulcer disease. I would suggest taking dexlansoprazole BID if she is not.   Ok to pursue HIDA. Await findings before decision about surgical referral because to date I'm not aware of any positive gallbladder findings. MRI Abd/MRCP 07/2023 with normal gallbladder, no stones. HIDA will complete gb work up.   We can do stat CBC, CMET, lipase.   If her pain is significant in the meantime, she should go to the ER.

## 2023-12-11 NOTE — Telephone Encounter (Signed)
 Note: Patient lives in [place] and when I reached the patient it was 4:30, all labs including the [facility] were closing before patient could get to either lab. She stated she would go for the labs first thing in the am, and if worsens she will go to the Ed. Please see note regarding the need for a script for Dexlansoprazole for bid. thanks

## 2023-12-11 NOTE — Telephone Encounter (Signed)
 Patient calling today with complaints of RUQ pain and light Mat Carne) looking stools. Patient says she has had the RUQ pain ongoing since last seen by Lewie Loron, Np on 07/16/2023, but becoming more consistent. She says when she eats the area feels like it is going to "explode", and her abdomen swells. Patient denies any fevers, or sight of blood in stools, or dark stools, says she would like an appointment with Dr. Levon Hedger, to speak with him to see if he would do another Egd on her. Patient last Egd was done July 23, 2023. She reports to have seen her PCP, and he has arranged for her to have a HIDA scan done on Monday 12/15/2023 at 10 am in [place]. Patient reports she is still taking her Dexlansoprazole, and Carafate as prescribed, she is avoiding NSAID's, and she is still smoking cigarettes, as she has been stressed as of late. Per Anna's note if no improvement may refer to a surgeon. Does patient need to be seen sooner than 12/22/2023 @9 :10 am when Dr.Castaneda is back? This is his next available slot. Do we make referral to Surgeon, or wait for the results of her HIDA scan from Monday? Need to proceed to Ed? Blood work? Please advise. Thanks,  Anna's plan as of 07/16/2023:  We will arrange EGD with Dr. Levon Hedger in the near future.  Continue Dexilant and Carafate STOP budesonide. She was asked not to start this on her own Smoking cessation again discussed. NSAIDs avoidance Consider surgical referral for elective cholecystectomy Patient would benefit from in-person evaluation for abdominal exam in the future if symptoms worsen in interim between now and EGD

## 2023-12-11 NOTE — Addendum Note (Signed)
 Addended by: Tiffany Kocher on: 12/11/2023 09:33 PM   Modules accepted: Orders

## 2023-12-12 LAB — COMPREHENSIVE METABOLIC PANEL
AG Ratio: 1.4 (calc) (ref 1.0–2.5)
ALT: 6 U/L (ref 6–29)
AST: 12 U/L (ref 10–35)
Albumin: 3.6 g/dL (ref 3.6–5.1)
Alkaline phosphatase (APISO): 76 U/L (ref 37–153)
BUN: 16 mg/dL (ref 7–25)
CO2: 30 mmol/L (ref 20–32)
Calcium: 9.2 mg/dL (ref 8.6–10.4)
Chloride: 103 mmol/L (ref 98–110)
Creat: 0.73 mg/dL (ref 0.50–1.03)
Globulin: 2.5 g/dL (ref 1.9–3.7)
Glucose, Bld: 91 mg/dL (ref 65–99)
Potassium: 4.8 mmol/L (ref 3.5–5.3)
Sodium: 139 mmol/L (ref 135–146)
Total Bilirubin: 0.1 mg/dL — ABNORMAL LOW (ref 0.2–1.2)
Total Protein: 6.1 g/dL (ref 6.1–8.1)
eGFR: 96 mL/min/{1.73_m2} (ref >=60)

## 2023-12-12 LAB — CBC WITH DIFFERENTIAL/PLATELET
Absolute Lymphocytes: 1192 {cells}/uL (ref 850–3900)
Absolute Monocytes: 848 {cells}/uL (ref 200–950)
Basophils Absolute: 81 {cells}/uL (ref 0–200)
Basophils Relative: 0.8 %
Eosinophils Absolute: 283 {cells}/uL (ref 15–500)
Eosinophils Relative: 2.8 %
HCT: 39.4 % (ref 35.0–45.0)
Hemoglobin: 12.6 g/dL (ref 11.7–15.5)
MCH: 27.8 pg (ref 27.0–33.0)
MCHC: 32 g/dL (ref 32.0–36.0)
MCV: 86.8 fL (ref 80.0–100.0)
MPV: 8.9 fL (ref 7.5–12.5)
Monocytes Relative: 8.4 %
Neutro Abs: 7696 {cells}/uL (ref 1500–7800)
Neutrophils Relative %: 76.2 %
Platelets: 514 10*3/uL — ABNORMAL HIGH (ref 140–400)
RBC: 4.54 10*6/uL (ref 3.80–5.10)
RDW: 14.6 % (ref 11.0–15.0)
Total Lymphocyte: 11.8 %
WBC: 10.1 10*3/uL (ref 3.8–10.8)

## 2023-12-12 LAB — LIPASE: Lipase: 26 U/L (ref 7–60)

## 2023-12-12 NOTE — Telephone Encounter (Signed)
 I spoke with [facility] and the medication has been approved by insurance for BID, no pa needed.

## 2023-12-12 NOTE — Telephone Encounter (Signed)
 Patient made aware she may pick up the script from [facility].

## 2023-12-13 ENCOUNTER — Encounter (INDEPENDENT_AMBULATORY_CARE_PROVIDER_SITE_OTHER): Payer: Self-pay | Admitting: Gastroenterology

## 2023-12-22 ENCOUNTER — Ambulatory Visit (INDEPENDENT_AMBULATORY_CARE_PROVIDER_SITE_OTHER): Admitting: Gastroenterology

## 2023-12-22 ENCOUNTER — Telehealth (INDEPENDENT_AMBULATORY_CARE_PROVIDER_SITE_OTHER): Payer: Self-pay | Admitting: Gastroenterology

## 2023-12-22 ENCOUNTER — Encounter (INDEPENDENT_AMBULATORY_CARE_PROVIDER_SITE_OTHER): Payer: Self-pay | Admitting: Gastroenterology

## 2023-12-22 VITALS — BP 122/85 | HR 91 | Temp 97.8°F | Ht 64.0 in | Wt 121.0 lb

## 2023-12-22 DIAGNOSIS — F17200 Nicotine dependence, unspecified, uncomplicated: Secondary | ICD-10-CM | POA: Diagnosis not present

## 2023-12-22 DIAGNOSIS — K269 Duodenal ulcer, unspecified as acute or chronic, without hemorrhage or perforation: Secondary | ICD-10-CM

## 2023-12-22 NOTE — Telephone Encounter (Signed)
 Message received from endo and pt left 2 voicemail's stating she would not be able to make EGD tomorrow. Contacted pt and pt rescheduled to 12/31/23 at 12:30pm. Will send updated instructions via my chart

## 2023-12-22 NOTE — H&P (View-Only) (Signed)
 [name], M.D. Gastroenterology & Hepatology [facility] Gastroenterology [address] [place], [place] [address]  Primary Care Physician: Ignatius Specking, MD [address] [place] [address]  I will communicate my assessment and recommendations to the referring MD via EMR.  Problems: Recurrent large gastroduodenal junction ulcer Collagenous colitis  History of Present Illness: [name] is a 58 y.o. 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  The patient was last seen on 07/16/2023. At that time, the patient was scheduled for EGD as described below.  Was advised to continue Dexilant and Carafate, insisted on smoking cessation.  Given severity of ulceration, patient was advised to continue Dexilant and sucralfate, but also I insisted her on smoking cessation.  Pathology was negative for H. Pylori.   Patient reports that she is having constant pain in her RUQ. States that her abdominal pain is severe and present all the time in her RUQ. Eating aggravates her pain. She is feeling bloating in her RUQ. She has some nausea but no vomiting. Had very transient diarrhea x1 but not frequently. She states that she has noticed an odor in her stool and flatulence. Also has noticed sometimes her stool is "clay colored". The patient denies having any nausea, vomiting, fever, chills, hematochezia, melena, hematemesis, diarrhea, jaundice, pruritus or weight loss. Has had some decreased food intake.  Still smoking 8 cigarettes per day. Has tried to cut down on the smoking.  Still taking Dexilant 60 mg BID and Carafate 1 g every 6 hours.  CT abdomen and pelvis with contrast was performed on 07/08/2023 which showed thickening of the distal descending and sigmoid colon without inflammatory changes.  No other  changes were found.  Previous gastrin levels were 114 in 2019.  Last EGD: 07/23/23 - 1 cm hiatal hernia. - Partially obstructing non- bleeding gastric ulcer with a clean ulcer base ( Forrest Class III) . There is no evidence of perforation. Biopsied. - Normal first portion of the duodenum and second portion of the duodenum.  Advise repeat EGD in 3 months.  Last Colonoscopy: 10/25/2022 - The entire examined colon is normal. Biopsied. - Non- bleeding internal hemorrhoids. Repeat colonoscopy in 10 years  Past Medical History: Past Medical History:  Diagnosis Date   Anxiety    Cyst of right kidney    Depression    Essential hypertension, benign 12/26/2016   Fibroadenoma of right breast    S/P LUMPECTOMY W/ RADIOACTIVE SEED IMPLANT 05-19-2015   Fibromyalgia    GERD (gastroesophageal reflux disease)    History of cervical dysplasia    s/p conzation and recurrent s/p  vaginal hysterectomy 2008   History of duodenal ulcer    1992--  S/P  REPAIR PERFORATED ULCER   History of gastric ulcer    age 75   Hypertension    IBS (irritable bowel syndrome)    Vaginal Pap smear, abnormal    VIN II (vulvar intraepithelial neoplasia II)    Wears glasses    Wears partial dentures    lower    Past Surgical History: Past Surgical History:  Procedure Laterality Date   BALLOON DILATION  12/21/2021   Procedure: BALLOON DILATION;  Surgeon: Dolores Frame, MD;  [facility];  Service: Gastroenterology;;   BIOPSY  12/19/2021   Procedure: BIOPSY;  Surgeon: Dolores Frame, MD;  [facility];  Service: Gastroenterology;;   BIOPSY  10/25/2022   Procedure: BIOPSY;  Surgeon: Dolores Frame, MD;  [facility];  Service: Gastroenterology;;   BIOPSY  07/23/2023   Procedure: BIOPSY;  Surgeon: Dolores Frame, MD;  [facility];  Service: Gastroenterology;;   BLADDER SLING PROCEDURE  2005  approx.   CERVICAL CONIZATION W/BX   2006  approx   CO2 LASER APPLICATION N/A 06/13/2016   Procedure: CO2 LASER OF THE VAGINA;  Surgeon: Adolphus Birchwood, MD;  Location: [facility];  Service: Gynecology;  Laterality: N/A;   COLONOSCOPY WITH PROPOFOL N/A 10/25/2022   Procedure: COLONOSCOPY WITH PROPOFOL;  Surgeon: Dolores Frame, MD;  [facility];  Service: Gastroenterology;  Laterality: N/A;  9:45am, asa 1-2   ESOPHAGEAL DILATION N/A 06/14/2020   Procedure: ESOPHAGEAL DILATION;  Surgeon: Malissa Hippo, MD;  [facility];  Service: Endoscopy;  Laterality: N/A;   ESOPHAGEAL DILATION N/A 10/04/2020   Procedure: ESOPHAGEAL DILATION;  Surgeon: Malissa Hippo, MD;  [facility];  Service: Endoscopy;  Laterality: N/A;   ESOPHAGOGASTRODUODENOSCOPY N/A 01/21/2017   Procedure: ESOPHAGOGASTRODUODENOSCOPY (EGD);  Surgeon: Malissa Hippo, MD;  [facility];  Service: Endoscopy;  Laterality: N/A;   ESOPHAGOGASTRODUODENOSCOPY N/A 07/17/2017   Procedure: ESOPHAGOGASTRODUODENOSCOPY (EGD);  Surgeon: Malissa Hippo, MD;  [facility];  Service: Endoscopy;  Laterality: N/A;  1:00   ESOPHAGOGASTRODUODENOSCOPY N/A 10/16/2017   Procedure: ESOPHAGOGASTRODUODENOSCOPY (EGD)WITH DILATION;  Surgeon: Malissa Hippo, MD;  [facility];  Service: Endoscopy;  Laterality: N/A;  1030   ESOPHAGOGASTRODUODENOSCOPY (EGD) WITH PROPOFOL N/A 01/23/2017   Procedure: ESOPHAGOGASTRODUODENOSCOPY (EGD) WITH PROPOFOL;  Surgeon: Malissa Hippo, MD;  [facility];  Service: Endoscopy;  Laterality: N/A;  duodenal striture dilation   ESOPHAGOGASTRODUODENOSCOPY (EGD) WITH PROPOFOL N/A 06/14/2020   Procedure: ESOPHAGOGASTRODUODENOSCOPY (EGD) WITH PROPOFOL;  Surgeon: Malissa Hippo, MD;  [facility];  Service: Endoscopy;  Laterality: N/A;  1055   ESOPHAGOGASTRODUODENOSCOPY (EGD) WITH PROPOFOL N/A 10/04/2020   Procedure: ESOPHAGOGASTRODUODENOSCOPY (EGD) WITH PROPOFOL;   Surgeon: Malissa Hippo, MD;  [facility];  Service: Endoscopy;  Laterality: N/A;  1:00   ESOPHAGOGASTRODUODENOSCOPY (EGD) WITH PROPOFOL N/A 12/19/2021   Procedure: ESOPHAGOGASTRODUODENOSCOPY (EGD) WITH PROPOFOL;  Surgeon: Dolores Frame, MD;  [facility];  Service: Gastroenterology;  Laterality: N/A;  815   ESOPHAGOGASTRODUODENOSCOPY (EGD) WITH PROPOFOL N/A 12/21/2021   Procedure: ESOPHAGOGASTRODUODENOSCOPY (EGD) WITH PROPOFOL;  Surgeon: Dolores Frame, MD;  [facility];  Service: Gastroenterology;  Laterality: N/A;  205 ASA 1   ESOPHAGOGASTRODUODENOSCOPY (EGD) WITH PROPOFOL N/A 10/25/2022   Procedure: ESOPHAGOGASTRODUODENOSCOPY (EGD) WITH PROPOFOL;  Surgeon: Dolores Frame, MD;  [facility];  Service: Gastroenterology;  Laterality: N/A;   ESOPHAGOGASTRODUODENOSCOPY (EGD) WITH PROPOFOL N/A 07/23/2023   Procedure: ESOPHAGOGASTRODUODENOSCOPY (EGD) WITH PROPOFOL;  Surgeon: Dolores Frame, MD;  [facility];  Service: Gastroenterology;  Laterality: N/A;  2:45PM;ASA 1 per Tanya to 10/30 at 12:45   LAPAROSCOPY N/A 12/19/2017   Procedure: LAPAROSCOPY, PYLOROPLASTY WITH Peggye Ley;  Surgeon: Luretha Murphy, MD;  Location: [facility];  Service: General;  Laterality: N/A;   NASAL SEPTOPLASTY W/ TURBINOPLASTY Bilateral 04/30/2019   Procedure: BILATERAL NASAL SEPTOPLASTY WITH TURBINATE REDUCTION;  Surgeon: Newman Pies, MD;  Location: [facility];  Service: ENT;  Laterality: Bilateral;   RADIOACTIVE SEED GUIDED EXCISIONAL BREAST BIOPSY Right 05/19/2015  Procedure: EXCISIONAL RIGHT BREAST MASS WITH RADIOACTIVE SEED LOCALIZATION;  Surgeon: Claud Kelp, MD;  Location: [facility];  Service: General;  Laterality: Right;   REPAIR OF PERFORATED ULCER  1992   duodenal   VAGINAL HYSTERECTOMY  11/13/2006    Family History: Family History  Problem Relation Age of Onset   Clotting disorder  Father    Diabetes Father    Hypertension Father    Hypertension Mother    Kidney failure Mother    Hypertension Brother    Liver cancer Brother    Other Brother        cyst on kidney   Hypertension Sister    Hypertension Maternal Aunt    Hypertension Maternal Uncle    Hypertension Paternal Aunt    Hypertension Paternal Uncle     Social History: Social History   Tobacco Use  Smoking Status Every Day   Current packs/day: 1.00   Average packs/day: 1 pack/day for 18.0 years (18.0 ttl pk-yrs)   Types: Cigarettes   Passive exposure: Current  Smokeless Tobacco Never   Social History   Substance and Sexual Activity  Alcohol Use No   Social History   Substance and Sexual Activity  Drug Use No    Allergies: Allergies  Allergen Reactions   Codeine Nausea Only   Hydrocodone Nausea Only    feels hot and insomia   Sulfa Antibiotics Hives   Penicillin V Nausea Only    Medications: Current Outpatient Medications  Medication Sig Dispense Refill   ALPRAZolam (XANAX) 0.25 MG tablet Take 0.25 mg by mouth 2 (two) times daily as needed for anxiety.     alum & mag hydroxide-simeth (MAALOX/MYLANTA) 200-200-20 MG/5ML suspension Take 15 mLs by mouth every 6 (six) hours as needed for indigestion or heartburn.     amLODipine (NORVASC) 10 MG tablet Take 10 mg by mouth daily as needed (High blood pressure).     Bismuth 262 MG CHEW Chew 2 each by mouth every 6 (six) hours. 112 tablet 0   dexlansoprazole (DEXILANT) 60 MG capsule Take 1 capsule (60 mg total) by mouth 2 (two) times daily before a meal. 60 capsule 2   estradiol (VIVELLE-DOT) 0.1 MG/24HR patch place 1 PATCH onto THE SKIN twice WEEKLY 40 patch 0   fluticasone (FLONASE) 50 MCG/ACT nasal spray Place 2 sprays into both nostrils daily as needed for allergies.     hyoscyamine (LEVSIN SL) 0.125 MG SL tablet Place 1 tablet (0.125 mg total) under the tongue every 8 (eight) hours as needed for cramping (abdominal pain). 90 tablet 2    Multiple Vitamin (MULTIVITAMIN WITH MINERALS) TABS tablet Take 1 tablet by mouth daily.     ondansetron (ZOFRAN) 4 MG tablet Take 1 tablet (4 mg total) by mouth every 6 (six) hours. As needed for nausea vomiting 12 tablet 0   potassium chloride (MICRO-K) 10 MEQ CR capsule Take 10 mEq by mouth daily.     Probiotic Product (PROBIOTIC DAILY PO) Take 1 tablet by mouth daily.     sucralfate (CARAFATE) 1 g tablet TAKE 1 TABLET BY MOUTH FOUR TIMES DAILY -with meals AND AT BEDTIME 120 tablet 0   traMADol (ULTRAM) 50 MG tablet Take 50 mg by mouth every 6 (six) hours as needed for moderate pain.      valACYclovir (VALTREX) 1000 MG tablet Take 1,000 mg by mouth daily as needed (Flair -up).  5   No current facility-administered medications for this visit.    Review of Systems:  GENERAL: negative for malaise, night sweats HEENT: No changes in hearing or vision, no nose bleeds or other nasal problems. NECK: Negative for lumps, goiter, pain and significant neck swelling RESPIRATORY: Negative for cough, wheezing CARDIOVASCULAR: Negative for chest pain, leg swelling, palpitations, orthopnea GI: SEE HPI MUSCULOSKELETAL: Negative for joint pain or swelling, back pain, and muscle pain. SKIN: Negative for lesions, rash PSYCH: Negative for sleep disturbance, mood disorder and recent psychosocial stressors. HEMATOLOGY Negative for prolonged bleeding, bruising easily, and swollen nodes. ENDOCRINE: Negative for cold or heat intolerance, polyuria, polydipsia and goiter. NEURO: negative for tremor, gait imbalance, syncope and seizures. The remainder of the review of systems is noncontributory.   Physical Exam: BP 122/85 (BP Location: Left Arm, Patient Position: Sitting, Cuff Size: Normal)   Pulse 91   Temp 97.8 F (36.6 C) (Oral)   Ht 5\' 4"  (1.626 m)   Wt 121 lb (54.9 kg)   BMI 20.77 kg/m  GENERAL: The patient is AO x3, in no acute distress. HEENT: Head is normocephalic and atraumatic. EOMI are intact.  Mouth is well hydrated and without lesions. NECK: Supple. No masses LUNGS: Clear to auscultation. No presence of rhonchi/wheezing/rales. Adequate chest expansion HEART: RRR, normal s1 and s2. ABDOMEN: Tender to palpation in the right upper quadrant, no guarding, no peritoneal signs, and nondistended. BS +. No masses. EXTREMITIES: Without any cyanosis, clubbing, rash, lesions or edema. NEUROLOGIC: AOx3, no focal motor deficit. SKIN: no jaundice, no rashes  Imaging/Labs: as above  I personally reviewed and interpreted the available labs, imaging and endoscopic files.  Impression and Plan: [name] is a 58 y.o. 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  Patient had evidence of recurrent severe large duodenal ulceration most recent EGD which has been managed with a combination of PPI and Carafate.  Patient remains very symptomatic.  Unfortunately, she has not been able to stop smoking, I strongly encouraged her to stop smoking as this is imperative for adequate wound healing.  We will need to reassess the current situation of her duodenal ulceration with a repeat EGD.  For now she will continue with Dexilant and Carafate.  We discussed that if there is presence of persistent ulceration, we will need to check serum gastrin levels to rule out Zollinger-Ellison syndrome and may give an empiric course of bismuth quadruple therapy for H. pylori.  Ultimately, if she presents persistent ulceration, may need to discuss surgical management.  -Schedule EGD -If presence of persistent ulcer, we will give you an empiric course of quadruple bismuth regimen for H. Pylori and we will check for serum gastrin levels -Smoking cessation is imperative -If presenting worsening severe abdominal pain, may need to have urgent evaluation in the  ER -Continue Dexilant 60 mg twice daily -Continue sucralfate 1 g every 6 hours -If refractory ulcer, may need to have evaluation by surgery for refractory ulceration  All questions were answered.      Wendy Blazing, MD Gastroenterology and Hepatology [facility] Gastroenterology

## 2023-12-22 NOTE — Patient Instructions (Addendum)
 Schedule EGD If presence of persistent ulcer, we will give you an empiric course of quadruple bismuth regimen for H. Pylori Smoking cessation is imperative If presenting worsening severe abdominal pain, may need to have urgent evaluation in the ER Continue Dexilant 60 mg twice daily Continue sucralfate 1 g every 6 hours If refractory ulcer, may need to have evaluation by surgery for refractory ulceration

## 2023-12-22 NOTE — Progress Notes (Unsigned)
 [name], M.D. Gastroenterology & Hepatology [facility] Gastroenterology [address] [place], [place] [address]  Primary Care Physician: Ignatius Specking, MD [address] [place] [address]  I will communicate my assessment and recommendations to the referring MD via EMR.  Problems: Recurrent large gastroduodenal junction ulcer Collagenous colitis  History of Present Illness: [name] is a 58 y.o. 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  The patient was last seen on 07/16/2023. At that time, the patient was scheduled for EGD as described below.  Was advised to continue Dexilant and Carafate, insisted on smoking cessation.  Given severity of ulceration, patient was advised to continue Dexilant and sucralfate, but also I insisted her on smoking cessation.  Pathology was negative for H. Pylori.   Patient reports that she is having constant pain in her RUQ. States that her abdominal pain is severe and present all the time in her RUQ. Eating aggravates her pain. She is feeling bloating in her RUQ. She has some nausea but no vomiting. Had very transient diarrhea x1 but not frequently. She states that she has noticed an odor in her stool and flatulence. Also has noticed sometimes her stool is "clay colored". The patient denies having any nausea, vomiting, fever, chills, hematochezia, melena, hematemesis, diarrhea, jaundice, pruritus or weight loss. Has had some decreased food intake.  Still smoking 8 cigarettes per day. Has tried to cut down on the smoking.  Still taking Dexilant 60 mg BID and Carafate 1 g every 6 hours.  CT abdomen and pelvis with contrast was performed on 07/08/2023 which showed thickening of the distal descending and sigmoid colon without inflammatory changes.  No other  changes were found.  Previous gastrin levels were 114 in 2019.  Last EGD: 07/23/23 - 1 cm hiatal hernia. - Partially obstructing non- bleeding gastric ulcer with a clean ulcer base ( Forrest Class III) . There is no evidence of perforation. Biopsied. - Normal first portion of the duodenum and second portion of the duodenum.  Advise repeat EGD in 3 months.  Last Colonoscopy: 10/25/2022 - The entire examined colon is normal. Biopsied. - Non- bleeding internal hemorrhoids. Repeat colonoscopy in 10 years  Past Medical History: Past Medical History:  Diagnosis Date   Anxiety    Cyst of right kidney    Depression    Essential hypertension, benign 12/26/2016   Fibroadenoma of right breast    S/P LUMPECTOMY W/ RADIOACTIVE SEED IMPLANT 05-19-2015   Fibromyalgia    GERD (gastroesophageal reflux disease)    History of cervical dysplasia    s/p conzation and recurrent s/p  vaginal hysterectomy 2008   History of duodenal ulcer    1992--  S/P  REPAIR PERFORATED ULCER   History of gastric ulcer    age 75   Hypertension    IBS (irritable bowel syndrome)    Vaginal Pap smear, abnormal    VIN II (vulvar intraepithelial neoplasia II)    Wears glasses    Wears partial dentures    lower    Past Surgical History: Past Surgical History:  Procedure Laterality Date   BALLOON DILATION  12/21/2021   Procedure: BALLOON DILATION;  Surgeon: Dolores Frame, MD;  [facility];  Service: Gastroenterology;;   BIOPSY  12/19/2021   Procedure: BIOPSY;  Surgeon: Dolores Frame, MD;  [facility];  Service: Gastroenterology;;   BIOPSY  10/25/2022   Procedure: BIOPSY;  Surgeon: Dolores Frame, MD;  [facility];  Service: Gastroenterology;;   BIOPSY  07/23/2023   Procedure: BIOPSY;  Surgeon: Dolores Frame, MD;  [facility];  Service: Gastroenterology;;   BLADDER SLING PROCEDURE  2005  approx.   CERVICAL CONIZATION W/BX   2006  approx   CO2 LASER APPLICATION N/A 06/13/2016   Procedure: CO2 LASER OF THE VAGINA;  Surgeon: Adolphus Birchwood, MD;  Location: [facility];  Service: Gynecology;  Laterality: N/A;   COLONOSCOPY WITH PROPOFOL N/A 10/25/2022   Procedure: COLONOSCOPY WITH PROPOFOL;  Surgeon: Dolores Frame, MD;  [facility];  Service: Gastroenterology;  Laterality: N/A;  9:45am, asa 1-2   ESOPHAGEAL DILATION N/A 06/14/2020   Procedure: ESOPHAGEAL DILATION;  Surgeon: Malissa Hippo, MD;  [facility];  Service: Endoscopy;  Laterality: N/A;   ESOPHAGEAL DILATION N/A 10/04/2020   Procedure: ESOPHAGEAL DILATION;  Surgeon: Malissa Hippo, MD;  [facility];  Service: Endoscopy;  Laterality: N/A;   ESOPHAGOGASTRODUODENOSCOPY N/A 01/21/2017   Procedure: ESOPHAGOGASTRODUODENOSCOPY (EGD);  Surgeon: Malissa Hippo, MD;  [facility];  Service: Endoscopy;  Laterality: N/A;   ESOPHAGOGASTRODUODENOSCOPY N/A 07/17/2017   Procedure: ESOPHAGOGASTRODUODENOSCOPY (EGD);  Surgeon: Malissa Hippo, MD;  [facility];  Service: Endoscopy;  Laterality: N/A;  1:00   ESOPHAGOGASTRODUODENOSCOPY N/A 10/16/2017   Procedure: ESOPHAGOGASTRODUODENOSCOPY (EGD)WITH DILATION;  Surgeon: Malissa Hippo, MD;  [facility];  Service: Endoscopy;  Laterality: N/A;  1030   ESOPHAGOGASTRODUODENOSCOPY (EGD) WITH PROPOFOL N/A 01/23/2017   Procedure: ESOPHAGOGASTRODUODENOSCOPY (EGD) WITH PROPOFOL;  Surgeon: Malissa Hippo, MD;  [facility];  Service: Endoscopy;  Laterality: N/A;  duodenal striture dilation   ESOPHAGOGASTRODUODENOSCOPY (EGD) WITH PROPOFOL N/A 06/14/2020   Procedure: ESOPHAGOGASTRODUODENOSCOPY (EGD) WITH PROPOFOL;  Surgeon: Malissa Hippo, MD;  [facility];  Service: Endoscopy;  Laterality: N/A;  1055   ESOPHAGOGASTRODUODENOSCOPY (EGD) WITH PROPOFOL N/A 10/04/2020   Procedure: ESOPHAGOGASTRODUODENOSCOPY (EGD) WITH PROPOFOL;   Surgeon: Malissa Hippo, MD;  [facility];  Service: Endoscopy;  Laterality: N/A;  1:00   ESOPHAGOGASTRODUODENOSCOPY (EGD) WITH PROPOFOL N/A 12/19/2021   Procedure: ESOPHAGOGASTRODUODENOSCOPY (EGD) WITH PROPOFOL;  Surgeon: Dolores Frame, MD;  [facility];  Service: Gastroenterology;  Laterality: N/A;  815   ESOPHAGOGASTRODUODENOSCOPY (EGD) WITH PROPOFOL N/A 12/21/2021   Procedure: ESOPHAGOGASTRODUODENOSCOPY (EGD) WITH PROPOFOL;  Surgeon: Dolores Frame, MD;  [facility];  Service: Gastroenterology;  Laterality: N/A;  205 ASA 1   ESOPHAGOGASTRODUODENOSCOPY (EGD) WITH PROPOFOL N/A 10/25/2022   Procedure: ESOPHAGOGASTRODUODENOSCOPY (EGD) WITH PROPOFOL;  Surgeon: Dolores Frame, MD;  [facility];  Service: Gastroenterology;  Laterality: N/A;   ESOPHAGOGASTRODUODENOSCOPY (EGD) WITH PROPOFOL N/A 07/23/2023   Procedure: ESOPHAGOGASTRODUODENOSCOPY (EGD) WITH PROPOFOL;  Surgeon: Dolores Frame, MD;  [facility];  Service: Gastroenterology;  Laterality: N/A;  2:45PM;ASA 1 per Tanya to 10/30 at 12:45   LAPAROSCOPY N/A 12/19/2017   Procedure: LAPAROSCOPY, PYLOROPLASTY WITH Peggye Ley;  Surgeon: Luretha Murphy, MD;  Location: [facility];  Service: General;  Laterality: N/A;   NASAL SEPTOPLASTY W/ TURBINOPLASTY Bilateral 04/30/2019   Procedure: BILATERAL NASAL SEPTOPLASTY WITH TURBINATE REDUCTION;  Surgeon: Newman Pies, MD;  Location: [facility];  Service: ENT;  Laterality: Bilateral;   RADIOACTIVE SEED GUIDED EXCISIONAL BREAST BIOPSY Right 05/19/2015  Procedure: EXCISIONAL RIGHT BREAST MASS WITH RADIOACTIVE SEED LOCALIZATION;  Surgeon: Claud Kelp, MD;  Location: [facility];  Service: General;  Laterality: Right;   REPAIR OF PERFORATED ULCER  1992   duodenal   VAGINAL HYSTERECTOMY  11/13/2006    Family History: Family History  Problem Relation Age of Onset   Clotting disorder  Father    Diabetes Father    Hypertension Father    Hypertension Mother    Kidney failure Mother    Hypertension Brother    Liver cancer Brother    Other Brother        cyst on kidney   Hypertension Sister    Hypertension Maternal Aunt    Hypertension Maternal Uncle    Hypertension Paternal Aunt    Hypertension Paternal Uncle     Social History: Social History   Tobacco Use  Smoking Status Every Day   Current packs/day: 1.00   Average packs/day: 1 pack/day for 18.0 years (18.0 ttl pk-yrs)   Types: Cigarettes   Passive exposure: Current  Smokeless Tobacco Never   Social History   Substance and Sexual Activity  Alcohol Use No   Social History   Substance and Sexual Activity  Drug Use No    Allergies: Allergies  Allergen Reactions   Codeine Nausea Only   Hydrocodone Nausea Only    feels hot and insomia   Sulfa Antibiotics Hives   Penicillin V Nausea Only    Medications: Current Outpatient Medications  Medication Sig Dispense Refill   ALPRAZolam (XANAX) 0.25 MG tablet Take 0.25 mg by mouth 2 (two) times daily as needed for anxiety.     alum & mag hydroxide-simeth (MAALOX/MYLANTA) 200-200-20 MG/5ML suspension Take 15 mLs by mouth every 6 (six) hours as needed for indigestion or heartburn.     amLODipine (NORVASC) 10 MG tablet Take 10 mg by mouth daily as needed (High blood pressure).     Bismuth 262 MG CHEW Chew 2 each by mouth every 6 (six) hours. 112 tablet 0   dexlansoprazole (DEXILANT) 60 MG capsule Take 1 capsule (60 mg total) by mouth 2 (two) times daily before a meal. 60 capsule 2   estradiol (VIVELLE-DOT) 0.1 MG/24HR patch place 1 PATCH onto THE SKIN twice WEEKLY 40 patch 0   fluticasone (FLONASE) 50 MCG/ACT nasal spray Place 2 sprays into both nostrils daily as needed for allergies.     hyoscyamine (LEVSIN SL) 0.125 MG SL tablet Place 1 tablet (0.125 mg total) under the tongue every 8 (eight) hours as needed for cramping (abdominal pain). 90 tablet 2    Multiple Vitamin (MULTIVITAMIN WITH MINERALS) TABS tablet Take 1 tablet by mouth daily.     ondansetron (ZOFRAN) 4 MG tablet Take 1 tablet (4 mg total) by mouth every 6 (six) hours. As needed for nausea vomiting 12 tablet 0   potassium chloride (MICRO-K) 10 MEQ CR capsule Take 10 mEq by mouth daily.     Probiotic Product (PROBIOTIC DAILY PO) Take 1 tablet by mouth daily.     sucralfate (CARAFATE) 1 g tablet TAKE 1 TABLET BY MOUTH FOUR TIMES DAILY -with meals AND AT BEDTIME 120 tablet 0   traMADol (ULTRAM) 50 MG tablet Take 50 mg by mouth every 6 (six) hours as needed for moderate pain.      valACYclovir (VALTREX) 1000 MG tablet Take 1,000 mg by mouth daily as needed (Flair -up).  5   No current facility-administered medications for this visit.    Review of Systems:  GENERAL: negative for malaise, night sweats HEENT: No changes in hearing or vision, no nose bleeds or other nasal problems. NECK: Negative for lumps, goiter, pain and significant neck swelling RESPIRATORY: Negative for cough, wheezing CARDIOVASCULAR: Negative for chest pain, leg swelling, palpitations, orthopnea GI: SEE HPI MUSCULOSKELETAL: Negative for joint pain or swelling, back pain, and muscle pain. SKIN: Negative for lesions, rash PSYCH: Negative for sleep disturbance, mood disorder and recent psychosocial stressors. HEMATOLOGY Negative for prolonged bleeding, bruising easily, and swollen nodes. ENDOCRINE: Negative for cold or heat intolerance, polyuria, polydipsia and goiter. NEURO: negative for tremor, gait imbalance, syncope and seizures. The remainder of the review of systems is noncontributory.   Physical Exam: BP 122/85 (BP Location: Left Arm, Patient Position: Sitting, Cuff Size: Normal)   Pulse 91   Temp 97.8 F (36.6 C) (Oral)   Ht 5\' 4"  (1.626 m)   Wt 121 lb (54.9 kg)   BMI 20.77 kg/m  GENERAL: The patient is AO x3, in no acute distress. HEENT: Head is normocephalic and atraumatic. EOMI are intact.  Mouth is well hydrated and without lesions. NECK: Supple. No masses LUNGS: Clear to auscultation. No presence of rhonchi/wheezing/rales. Adequate chest expansion HEART: RRR, normal s1 and s2. ABDOMEN: Tender to palpation in the right upper quadrant, no guarding, no peritoneal signs, and nondistended. BS +. No masses. EXTREMITIES: Without any cyanosis, clubbing, rash, lesions or edema. NEUROLOGIC: AOx3, no focal motor deficit. SKIN: no jaundice, no rashes  Imaging/Labs: as above  I personally reviewed and interpreted the available labs, imaging and endoscopic files.  Impression and Plan: [name] is a 58 y.o. 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  Patient had evidence of recurrent severe large duodenal ulceration most recent EGD which has been managed with a combination of PPI and Carafate.  Patient remains very symptomatic.  Unfortunately, she has not been able to stop smoking, I strongly encouraged her to stop smoking as this is imperative for adequate wound healing.  We will need to reassess the current situation of her duodenal ulceration with a repeat EGD.  For now she will continue with Dexilant and Carafate.  We discussed that if there is presence of persistent ulceration, we will need to check serum gastrin levels to rule out Zollinger-Ellison syndrome and may give an empiric course of bismuth quadruple therapy for H. pylori.  Ultimately, if she presents persistent ulceration, may need to discuss surgical management.  -Schedule EGD -If presence of persistent ulcer, we will give you an empiric course of quadruple bismuth regimen for H. Pylori and we will check for serum gastrin levels -Smoking cessation is imperative -If presenting worsening severe abdominal pain, may need to have urgent evaluation in the  ER -Continue Dexilant 60 mg twice daily -Continue sucralfate 1 g every 6 hours -If refractory ulcer, may need to have evaluation by surgery for refractory ulceration  All questions were answered.      [name], MD Gastroenterology and Hepatology [facility] Gastroenterology

## 2023-12-31 ENCOUNTER — Other Ambulatory Visit: Payer: Self-pay

## 2023-12-31 ENCOUNTER — Ambulatory Visit (HOSPITAL_COMMUNITY)
Admission: RE | Admit: 2023-12-31 | Discharge: 2023-12-31 | Disposition: A | Discharge status: Home / Self Care | Attending: Gastroenterology | Admitting: Gastroenterology

## 2023-12-31 ENCOUNTER — Ambulatory Visit (HOSPITAL_COMMUNITY): Admitting: Anesthesiology

## 2023-12-31 ENCOUNTER — Encounter (HOSPITAL_COMMUNITY): Payer: Self-pay | Admitting: Gastroenterology

## 2023-12-31 ENCOUNTER — Encounter (INDEPENDENT_AMBULATORY_CARE_PROVIDER_SITE_OTHER): Payer: Self-pay | Admitting: Gastroenterology

## 2023-12-31 ENCOUNTER — Encounter (INDEPENDENT_AMBULATORY_CARE_PROVIDER_SITE_OTHER): Payer: Self-pay

## 2023-12-31 ENCOUNTER — Telehealth (INDEPENDENT_AMBULATORY_CARE_PROVIDER_SITE_OTHER): Payer: Self-pay

## 2023-12-31 ENCOUNTER — Other Ambulatory Visit (INDEPENDENT_AMBULATORY_CARE_PROVIDER_SITE_OTHER): Payer: Self-pay

## 2023-12-31 ENCOUNTER — Encounter (HOSPITAL_COMMUNITY)
Admission: RE | Disposition: A | Discharge status: Home / Self Care | Payer: Self-pay | Source: Home / Self Care | Attending: Gastroenterology

## 2023-12-31 DIAGNOSIS — K269 Duodenal ulcer, unspecified as acute or chronic, without hemorrhage or perforation: Secondary | ICD-10-CM

## 2023-12-31 DIAGNOSIS — R1031 Right lower quadrant pain: Secondary | ICD-10-CM

## 2023-12-31 DIAGNOSIS — Z8711 Personal history of peptic ulcer disease: Secondary | ICD-10-CM

## 2023-12-31 DIAGNOSIS — K257 Chronic gastric ulcer without hemorrhage or perforation: Secondary | ICD-10-CM

## 2023-12-31 DIAGNOSIS — K3189 Other diseases of stomach and duodenum: Secondary | ICD-10-CM | POA: Diagnosis not present

## 2023-12-31 DIAGNOSIS — I10 Essential (primary) hypertension: Secondary | ICD-10-CM | POA: Diagnosis not present

## 2023-12-31 DIAGNOSIS — Z79899 Other long term (current) drug therapy: Secondary | ICD-10-CM | POA: Insufficient documentation

## 2023-12-31 DIAGNOSIS — F17210 Nicotine dependence, cigarettes, uncomplicated: Secondary | ICD-10-CM | POA: Diagnosis not present

## 2023-12-31 DIAGNOSIS — K580 Irritable bowel syndrome with diarrhea: Secondary | ICD-10-CM | POA: Insufficient documentation

## 2023-12-31 DIAGNOSIS — R1013 Epigastric pain: Secondary | ICD-10-CM | POA: Diagnosis present

## 2023-12-31 DIAGNOSIS — R1011 Right upper quadrant pain: Secondary | ICD-10-CM

## 2023-12-31 DIAGNOSIS — R102 Pelvic and perineal pain: Secondary | ICD-10-CM

## 2023-12-31 HISTORY — PX: ESOPHAGOGASTRODUODENOSCOPY: SHX5428

## 2023-12-31 SURGERY — EGD (ESOPHAGOGASTRODUODENOSCOPY)
Anesthesia: General

## 2023-12-31 MED ORDER — LACTATED RINGERS IV SOLN: INTRAVENOUS | Status: DC | PRN

## 2023-12-31 MED ORDER — BISMUTH 262 MG PO CHEW: 2.0000 | CHEWABLE_TABLET | Freq: Four times a day (QID) | ORAL | 0 refills | Status: AC

## 2023-12-31 MED ORDER — PROPOFOL 10 MG/ML IV BOLUS
INTRAVENOUS | Status: DC | PRN
  Administered 2023-12-31 (×4): 50 mg via INTRAVENOUS

## 2023-12-31 MED ORDER — LIDOCAINE HCL (CARDIAC) PF 100 MG/5ML IV SOSY
PREFILLED_SYRINGE | INTRAVENOUS | Status: DC | PRN
  Administered 2023-12-31: 80 mg via INTRATRACHEAL

## 2023-12-31 MED ORDER — LACTATED RINGERS IV SOLN
INTRAVENOUS | Status: DC
  Administered 2023-12-31: 500 mL via INTRAVENOUS

## 2023-12-31 MED ORDER — METRONIDAZOLE 500 MG PO TABS: 500.0000 mg | ORAL_TABLET | Freq: Three times a day (TID) | ORAL | 0 refills | Status: DC

## 2023-12-31 MED ORDER — TETRACYCLINE HCL 500 MG PO CAPS: 500.0000 mg | ORAL_CAPSULE | Freq: Four times a day (QID) | ORAL | 0 refills | Status: DC

## 2023-12-31 NOTE — Discharge Instructions (Signed)
 You are being discharged to home.  Resume your previous diet.  We are waiting for your pathology results.  Continue your present medications.  Will check fasting gastrin levels. ABSOLUTE SMOKING CESSATION. If persistent symptoms, will refer to general surgery for partial gastrectomy.

## 2023-12-31 NOTE — Telephone Encounter (Signed)
 [facility] sent a Prior Authorization on the Tetracycline. What is the diagnosis please? I read the op note and it says she has an duodenal ulcer and you were sending in a Empiric course for H Pylori. Please advise, I am not sure they will pay for this with just the diagnosis of Duodenal ulcer. Is there a diagnosis of H pylori? Please advise.

## 2023-12-31 NOTE — Anesthesia Preprocedure Evaluation (Signed)
 Anesthesia Evaluation  Patient identified by MRN, date of birth, ID band Patient awake    Reviewed: Allergy & Precautions, H&P , NPO status , Patient's Chart, lab work & pertinent test results, reviewed documented beta blocker date and time   Airway Mallampati: II  TM Distance: >3 FB Neck ROM: full    Dental no notable dental hx. (+) Partial Lower   Pulmonary neg pulmonary ROS, Current Smoker   Pulmonary exam normal breath sounds clear to auscultation       Cardiovascular Exercise Tolerance: Good hypertension, negative cardio ROS  Rhythm:regular Rate:Normal     Neuro/Psych negative neurological ROS  negative psych ROS   GI/Hepatic negative GI ROS, Neg liver ROS,,,  Endo/Other  negative endocrine ROS    Renal/GU negative Renal ROS  negative genitourinary   Musculoskeletal   Abdominal   Peds  Hematology negative hematology ROS (+)   Anesthesia Other Findings   Reproductive/Obstetrics negative OB ROS                             Anesthesia Physical Anesthesia Plan  ASA: 2  Anesthesia Plan: General   Post-op Pain Management:    Induction:   PONV Risk Score and Plan: Propofol infusion  Airway Management Planned:   Additional Equipment:   Intra-op Plan:   Post-operative Plan:   Informed Consent: I have reviewed the patients History and Physical, chart, labs and discussed the procedure including the risks, benefits and alternatives for the proposed anesthesia with the patient or authorized representative who has indicated his/her understanding and acceptance.     Dental Advisory Given  Plan Discussed with: CRNA  Anesthesia Plan Comments:        Anesthesia Quick Evaluation

## 2023-12-31 NOTE — Telephone Encounter (Signed)
 Dolores Frame, MD  Meredith Leeds, CMA Hi Aryan Bello,  Can you please send her an order for gastrin? Needs to collect sample fasting first thing in AM  Thanks,  [name], MD Gastroenterology and Hepatology [facility] Gastroenterology

## 2023-12-31 NOTE — Op Note (Addendum)
 [facility] Patient Name: [name] Procedure Date: 12/31/2023 11:55 AM MRN: [id number] Date of Birth: [birth year] Attending MD: [name] , , [id number] CSN: [id number] Age: 58 Admit Type: Outpatient Procedure:                Upper GI endoscopy Indications:              Epigastric abdominal pain, Follow-up of chronic                            duodenal ulcer Providers:                [name], Sheran Fava, Dyann Ruddle Referring MD:              Medicines:                Monitored Anesthesia Care Complications:            No immediate complications. Estimated Blood Loss:     Estimated blood loss: none. Procedure:                Pre-Anesthesia Assessment:                           - Prior to the procedure, a History and Physical                            was performed, and patient medications, allergies                            and sensitivities were reviewed. The patient's                            tolerance of previous anesthesia was reviewed.                           - The risks and benefits of the procedure and the                            sedation options and risks were discussed with the                            patient. All questions were answered and informed                            consent was obtained.                           - ASA Grade Assessment: II - A patient with mild                            systemic disease.                           After obtaining informed consent, the endoscope was                            passed under direct vision. Throughout the  procedure, the patient's blood pressure, pulse, and                            oxygen saturations were monitored continuously. The                            [id number]-[id number] ([id number]) scope was introduced through the                            mouth, and advanced to the second part of duodenum.                            The upper GI endoscopy was  accomplished without                            difficulty. The patient tolerated the procedure                            well. Scope In: 12:08:35 PM Scope Out: 12:15:30 PM Total Procedure Duration: 0 hours 6 minutes 55 seconds  Findings:      The examined esophagus was normal.      The entire examined stomach was normal.      One partially obstructing non-bleeding cratered duodenal ulcer with a       clean ulcer base (Forrest Class III) was found in the duodenal bulb. The       lesion was 20 mm in largest dimension and extended into the pyloric       channel, causing partial obstruction. There is no evidence of       perforation. Biopsies were taken with a cold forceps for histology.      The examined duodenum was normal. Impression:               - Normal esophagus.                           - Normal stomach.                           - Partially obstructing non-bleeding duodenal ulcer                            with a clean ulcer base (Forrest Class III). There                            is no evidence of perforation. Biopsied.                           - Normal examined duodenum. Moderate Sedation:      Per Anesthesia Care Recommendation:           - Discharge patient to home (ambulatory).                           - Resume previous diet.                           -  Await pathology results.                           - Continue present medications.                           - Will check fasting gastrin levels.                           - Will send empiric course for H. pylori.                           - ABSOLUTE SMOKING CESSATION.                           - If persistent symptoms, will refer to general                            surgery for surgical management.                           - Follow up in GI clinic in 2 months Procedure Code(s):        --- Professional ---                           [phone], Esophagogastroduodenoscopy, flexible,                            transoral;  with biopsy, single or multiple Diagnosis Code(s):        --- Professional ---                           K26.9, Duodenal ulcer, unspecified as acute or                            chronic, without hemorrhage or perforation                           R10.13, Epigastric pain                           K26.7, Chronic duodenal ulcer without hemorrhage or                            perforation CPT copyright 2022 American Medical Association. All rights reserved. The codes documented in this report are preliminary and upon coder review may  be revised to meet current compliance requirements. [name], MD [name],  12/31/2023 12:26:32 PM This report has been signed electronically. Number of Addenda: 0

## 2023-12-31 NOTE — Interval H&P Note (Signed)
 History and Physical Interval Note:  12/31/2023 10:57 AM  [name]  has presented today for surgery, with the diagnosis of DUODENAL ULCER.  The various methods of treatment have been discussed with the patient and family. After consideration of risks, benefits and other options for treatment, the patient has consented to  Procedure(s) with comments: EGD (ESOPHAGOGASTRODUODENOSCOPY) (N/A) - 9:45AM;ASA 1 as a surgical intervention.  The patient's history has been reviewed, patient examined, no change in status, stable for surgery.  I have reviewed the patient's chart and labs.  Questions were answered to the patient's satisfaction.     [name] Mayorga

## 2023-12-31 NOTE — Transfer of Care (Signed)
 Immediate Anesthesia Transfer of Care Note  Patient: [name]  Procedure(s) Performed: EGD (ESOPHAGOGASTRODUODENOSCOPY)  Patient Location: PACU and Endoscopy Unit  Anesthesia Type:MAC  Level of Consciousness: awake and alert   Airway & Oxygen Therapy: Patient Spontanous Breathing  Post-op Assessment: Report given to RN and Post -op Vital signs reviewed and stable  Post vital signs: Reviewed and stable  Last Vitals:  Vitals Value Taken Time  BP    Temp    Pulse    Resp    SpO2      Last Pain:  Vitals:   12/31/23 1205  TempSrc:   PainSc: 9       Patients Stated Pain Goal: 7 (12/31/23 1123)  Complications: No notable events documented.

## 2024-01-01 ENCOUNTER — Encounter (INDEPENDENT_AMBULATORY_CARE_PROVIDER_SITE_OTHER): Payer: Self-pay

## 2024-01-01 ENCOUNTER — Encounter (HOSPITAL_COMMUNITY): Payer: Self-pay | Admitting: Gastroenterology

## 2024-01-01 ENCOUNTER — Other Ambulatory Visit (INDEPENDENT_AMBULATORY_CARE_PROVIDER_SITE_OTHER): Payer: Self-pay

## 2024-01-01 DIAGNOSIS — B9681 Helicobacter pylori [H. pylori] as the cause of diseases classified elsewhere: Secondary | ICD-10-CM

## 2024-01-01 MED ORDER — BISMUTH/METRONIDAZ/TETRACYCLIN 140-125-125 MG PO CAPS: 3.0000 | ORAL_CAPSULE | Freq: Four times a day (QID) | ORAL | 0 refills | Status: DC

## 2024-01-01 NOTE — Telephone Encounter (Signed)
 Submitted Prior Authorization to the patient insurance plan.

## 2024-01-01 NOTE — Telephone Encounter (Signed)
That's correct. Thanks!

## 2024-01-01 NOTE — Telephone Encounter (Signed)
 I have submitted the script to [facility].   I spoke with the patient and made her aware that insurance would not cover the tetracycline to not pick up the Flagyl nor the bismuth as this Pylera is the only medication she will need as the other two are included in Pylera.

## 2024-01-01 NOTE — Telephone Encounter (Signed)
 Name: [name] MID: [id number] Decision Date: 01/01/2024 YOU ASKED FOR: Requested Requested Service Description Code 1 Code 2 Plan Dates Amount Tetracycline Cap 500mg  Medicaid 01/01/2024 WE DENIED: Denied Denied Code 1 Code 2 Plan Service Description Dates Amount Tetracycline Cap 500mg  Medicaid 01/01/2024 We denied your request for:  Tetracycline Cap 500mg  Policy rules found at Clinical Coverage Policy 9, Outpatient Pharmacy Program guided our decision. Here are the policy requirements your request did not meet: Per your health plan's criteria, this drug is covered if you meet the following: One of the following: (1) You have failed one preferred drug as confirmed by claims history or submission of medical records. The preferred drug: Pylera Capsule. (2) You cannot use one preferred drug (please specify contraindication or intolerance). The information provided does not show that you meet the criteria listed above. Please speak with your doctor about your choices. This decision was made per the [employer] [place] NonPreferred Drugs Guideline. COMMENTS: Your doctor can call our Medical Director to talk about this decision by calling us at [phone]

## 2024-01-01 NOTE — Telephone Encounter (Signed)
 Can we send her Pylera for 10 days? Please let her know to NOT pick up the Flagyl and bismuth. She only needs to take the Pylera Thanks

## 2024-01-01 NOTE — Anesthesia Postprocedure Evaluation (Signed)
 Anesthesia Post Note  Patient: AMBERA FEDELE  Procedure(s) Performed: EGD (ESOPHAGOGASTRODUODENOSCOPY)  Patient location during evaluation: Phase II Anesthesia Type: General Level of consciousness: awake Pain management: pain level controlled Vital Signs Assessment: post-procedure vital signs reviewed and stable Respiratory status: spontaneous breathing and respiratory function stable Cardiovascular status: blood pressure returned to baseline and stable Postop Assessment: no headache and no apparent nausea or vomiting Anesthetic complications: no Comments: Late entry   No notable events documented.   Last Vitals:  Vitals:   12/31/23 1123 12/31/23 1221  BP: 135/76 120/62  Pulse: 87 92  Resp: 13 20  Temp: 36.7 C 36.8 C  SpO2: 96% 94%    Last Pain:  Vitals:   12/31/23 1221  TempSrc: Oral  PainSc:                  [name]

## 2024-01-01 NOTE — Telephone Encounter (Addendum)
 Three pylera capsule 4 times a day (after meals and at bedtime) for 10 days? # 120? Please advise.Thanks

## 2024-01-02 ENCOUNTER — Encounter (INDEPENDENT_AMBULATORY_CARE_PROVIDER_SITE_OTHER): Payer: Self-pay

## 2024-01-02 ENCOUNTER — Other Ambulatory Visit (INDEPENDENT_AMBULATORY_CARE_PROVIDER_SITE_OTHER): Payer: Self-pay

## 2024-01-02 DIAGNOSIS — B9681 Helicobacter pylori [H. pylori] as the cause of diseases classified elsewhere: Secondary | ICD-10-CM

## 2024-01-02 LAB — SURGICAL PATHOLOGY

## 2024-01-02 MED ORDER — BISMUTH/METRONIDAZ/TETRACYCLIN 140-125-125 MG PO CAPS: 3.0000 | ORAL_CAPSULE | Freq: Four times a day (QID) | ORAL | 0 refills | Status: DC

## 2024-01-02 NOTE — Telephone Encounter (Signed)
 I called [facility] they state the Pylera is on back order and they can not order it. I called [facility] they also say they can not get this medication as it is on a manufacture back order. I did call [place] [facility] and spoke with Trip and he says he can get the medication. Tried calling the patient two times to see if she was ok with me sending the script to [place], no answer.

## 2024-01-04 ENCOUNTER — Encounter (INDEPENDENT_AMBULATORY_CARE_PROVIDER_SITE_OTHER): Payer: Self-pay | Admitting: Gastroenterology

## 2024-01-04 LAB — GASTRIN: Gastrin: 156 pg/mL — ABNORMAL HIGH (ref <=100)

## 2024-01-05 ENCOUNTER — Encounter (INDEPENDENT_AMBULATORY_CARE_PROVIDER_SITE_OTHER): Payer: Self-pay

## 2024-01-05 ENCOUNTER — Other Ambulatory Visit (INDEPENDENT_AMBULATORY_CARE_PROVIDER_SITE_OTHER): Payer: Self-pay | Admitting: Gastroenterology

## 2024-01-05 DIAGNOSIS — A048 Other specified bacterial intestinal infections: Secondary | ICD-10-CM | POA: Insufficient documentation

## 2024-01-05 MED ORDER — SUCRALFATE 1 G PO TABS: 1.0000 g | ORAL_TABLET | Freq: Three times a day (TID) | ORAL | 3 refills | Status: DC

## 2024-01-06 ENCOUNTER — Telehealth (INDEPENDENT_AMBULATORY_CARE_PROVIDER_SITE_OTHER): Payer: Self-pay | Admitting: *Deleted

## 2024-01-06 NOTE — Telephone Encounter (Signed)
 Urban Garden, MD  Jonah Negus, can you please refer the patient to [facility] ? Dx: refractory duodenal ulcer  Thanks,   Samantha Cress, MD Gastroenterology and Hepatology [facility]

## 2024-01-06 NOTE — Telephone Encounter (Signed)
 Referral sent, they will contact patient with apt

## 2024-01-07 ENCOUNTER — Encounter (INDEPENDENT_AMBULATORY_CARE_PROVIDER_SITE_OTHER): Payer: Self-pay | Admitting: Gastroenterology

## 2024-01-08 ENCOUNTER — Telehealth (INDEPENDENT_AMBULATORY_CARE_PROVIDER_SITE_OTHER): Payer: Self-pay | Admitting: *Deleted

## 2024-01-08 ENCOUNTER — Ambulatory Visit: Payer: Medicaid Other | Admitting: Gastroenterology

## 2024-01-08 ENCOUNTER — Encounter (INDEPENDENT_AMBULATORY_CARE_PROVIDER_SITE_OTHER): Payer: Self-pay | Admitting: *Deleted

## 2024-01-08 NOTE — Telephone Encounter (Signed)
 Talked to scheduler a [facility] - none of their doctors handle ulcers now - where do you want me to send referral next?

## 2024-01-08 NOTE — Telephone Encounter (Signed)
 I think she wants to talk to Dr. Collene Dawson (saw MyChart message). If not accepting her, then she will need to be referred to [facility]. Thanks

## 2024-01-12 NOTE — Telephone Encounter (Signed)
 Referral sent, they will contact patient with apt

## 2024-01-13 ENCOUNTER — Encounter (INDEPENDENT_AMBULATORY_CARE_PROVIDER_SITE_OTHER): Payer: Self-pay | Admitting: Gastroenterology

## 2024-02-03 ENCOUNTER — Encounter (INDEPENDENT_AMBULATORY_CARE_PROVIDER_SITE_OTHER): Payer: Self-pay | Admitting: Gastroenterology

## 2024-02-12 ENCOUNTER — Other Ambulatory Visit: Payer: Self-pay

## 2024-02-12 DIAGNOSIS — D509 Iron deficiency anemia, unspecified: Secondary | ICD-10-CM | POA: Insufficient documentation

## 2024-02-12 DIAGNOSIS — D508 Other iron deficiency anemias: Secondary | ICD-10-CM

## 2024-02-17 ENCOUNTER — Telehealth: Payer: Self-pay

## 2024-02-17 NOTE — Telephone Encounter (Signed)
 Auth Submission: NO AUTH NEEDED Site of care: Site of care: [facility] Payer: [place] [employer]  Medication & CPT/J Code(s) submitted: Venofer (Iron Sucrose) J1756 Route of submission (phone, fax, portal): phone Phone # Fax # Auth type: Buy/Bill PB Units/visits requested: 200mg  x 5 doses Reference number:  Approval from: 02/17/24 to 08/19/24

## 2024-02-19 ENCOUNTER — Ambulatory Visit

## 2024-02-19 MED ORDER — DIPHENHYDRAMINE HCL 25 MG PO CAPS: 25.0000 mg | ORAL_CAPSULE | Freq: Once | ORAL | Status: AC

## 2024-02-19 MED ORDER — ACETAMINOPHEN 325 MG PO TABS: 650.0000 mg | ORAL_TABLET | Freq: Once | ORAL | Status: AC

## 2024-02-25 ENCOUNTER — Encounter: Attending: Internal Medicine | Admitting: Emergency Medicine

## 2024-02-25 ENCOUNTER — Encounter (INDEPENDENT_AMBULATORY_CARE_PROVIDER_SITE_OTHER): Payer: Self-pay | Admitting: Gastroenterology

## 2024-02-25 VITALS — BP 151/96 | HR 76 | Temp 98.1°F | Resp 16

## 2024-02-25 DIAGNOSIS — D509 Iron deficiency anemia, unspecified: Secondary | ICD-10-CM | POA: Insufficient documentation

## 2024-02-25 DIAGNOSIS — D508 Other iron deficiency anemias: Secondary | ICD-10-CM | POA: Diagnosis present

## 2024-02-25 MED ORDER — DIPHENHYDRAMINE HCL 25 MG PO CAPS
25.0000 mg | ORAL_CAPSULE | Freq: Once | ORAL | Status: AC
  Administered 2024-02-25: 25 mg via ORAL

## 2024-02-25 MED ORDER — IRON SUCROSE 20 MG/ML IV SOLN
200.0000 mg | Freq: Once | INTRAVENOUS | Status: AC
  Administered 2024-02-25: 200 mg via INTRAVENOUS

## 2024-02-25 MED ORDER — ACETAMINOPHEN 325 MG PO TABS
650.0000 mg | ORAL_TABLET | Freq: Once | ORAL | Status: AC
  Administered 2024-02-25: 650 mg via ORAL

## 2024-02-25 NOTE — Progress Notes (Signed)
 Diagnosis: Iron Deficiency Anemia  Provider:  Orlena Bitters, MD   Procedure: IV Push  IV Type: Peripheral, IV Location: L Hand  Venofer (Iron Sucrose), Dose: 200 mg  Post Infusion IV Care: Observation period completed and Peripheral IV Discontinued  Discharge: Condition: Good, Destination: Home . AVS Declined  Performed by:  Arlina Benjamin, RN

## 2024-02-25 NOTE — Telephone Encounter (Signed)
 Spoke to patient, advised I could send referral to [facility] however, they did review all referrals and it could take them several months to reach out to her. She decided to leave it at [facility] since she already had an apt there

## 2024-02-27 ENCOUNTER — Encounter (INDEPENDENT_AMBULATORY_CARE_PROVIDER_SITE_OTHER): Payer: Self-pay | Admitting: *Deleted

## 2024-03-03 ENCOUNTER — Ambulatory Visit

## 2024-03-03 ENCOUNTER — Other Ambulatory Visit (INDEPENDENT_AMBULATORY_CARE_PROVIDER_SITE_OTHER): Payer: Self-pay | Admitting: Gastroenterology

## 2024-03-03 DIAGNOSIS — K2970 Gastritis, unspecified, without bleeding: Secondary | ICD-10-CM

## 2024-03-17 ENCOUNTER — Other Ambulatory Visit: Payer: Self-pay | Admitting: Adult Health

## 2024-03-21 ENCOUNTER — Encounter (INDEPENDENT_AMBULATORY_CARE_PROVIDER_SITE_OTHER): Payer: Self-pay | Admitting: Gastroenterology

## 2024-03-22 ENCOUNTER — Other Ambulatory Visit (INDEPENDENT_AMBULATORY_CARE_PROVIDER_SITE_OTHER): Payer: Self-pay | Admitting: Gastroenterology

## 2024-03-22 DIAGNOSIS — K52839 Microscopic colitis, unspecified: Secondary | ICD-10-CM

## 2024-03-22 MED ORDER — BUDESONIDE 3 MG PO CPEP
ORAL_CAPSULE | ORAL | 0 refills | Status: AC
Start: 2024-03-22 — End: 2024-09-18

## 2024-03-31 ENCOUNTER — Telehealth (INDEPENDENT_AMBULATORY_CARE_PROVIDER_SITE_OTHER): Payer: Self-pay | Admitting: Gastroenterology

## 2024-03-31 NOTE — Telephone Encounter (Signed)
 Pt called into office and wanted to know if the Budesonide  she is on could cause her to be hot and break out in sweats. Pt states she is on 3 tablets daily at this time. Pt states the diarrhea has eased up some with the medication but not a lot. Pt states she has not been able to eat since having diarrhea and has lost 3 pounds. Pt also wanted to know does she really need the surgery for ulcer? Informed pt it would be best if she could be seen in clinic, pt agreed. Pt scheduled for Monday 04/05/24 at 1:50pm.

## 2024-04-01 ENCOUNTER — Encounter: Payer: Self-pay | Admitting: Physician Assistant

## 2024-04-01 ENCOUNTER — Telehealth (INDEPENDENT_AMBULATORY_CARE_PROVIDER_SITE_OTHER): Payer: Self-pay

## 2024-04-01 NOTE — Telephone Encounter (Signed)
 Patient says she was started on Budesonide  (due to green diarrhea) and after a week of taking it she began to get hot and sweating. She did not take it yesterday, but wanted to know if she needs to resume it since she was having these symptoms.Patient has an appointment here on Monday. Please advise.

## 2024-04-01 NOTE — Telephone Encounter (Signed)
 She can keep taking it, but if having too many side effects can try 2 pills instead of 3. If still having side effects, may need to stop the medication

## 2024-04-01 NOTE — Telephone Encounter (Signed)
 Noted

## 2024-04-01 NOTE — Telephone Encounter (Signed)
 I spoke with the patient and made her aware per Dr. Eartha, She can keep taking it, but if having too many side effects can try 2 pills instead of 3. If still having side effects, may need to stop the medication. Patient states understanding.

## 2024-04-05 ENCOUNTER — Encounter (INDEPENDENT_AMBULATORY_CARE_PROVIDER_SITE_OTHER): Admitting: Gastroenterology

## 2024-04-08 ENCOUNTER — Encounter (INDEPENDENT_AMBULATORY_CARE_PROVIDER_SITE_OTHER): Admitting: Gastroenterology

## 2024-04-15 ENCOUNTER — Encounter (INDEPENDENT_AMBULATORY_CARE_PROVIDER_SITE_OTHER): Payer: Self-pay | Admitting: Gastroenterology

## 2024-04-22 ENCOUNTER — Encounter (INDEPENDENT_AMBULATORY_CARE_PROVIDER_SITE_OTHER): Payer: Self-pay | Admitting: Gastroenterology

## 2024-04-22 ENCOUNTER — Encounter: Payer: Self-pay | Admitting: *Deleted

## 2024-04-22 ENCOUNTER — Ambulatory Visit (INDEPENDENT_AMBULATORY_CARE_PROVIDER_SITE_OTHER): Admitting: Gastroenterology

## 2024-04-22 ENCOUNTER — Telehealth: Payer: Self-pay | Admitting: *Deleted

## 2024-04-22 VITALS — BP 127/86 | HR 84 | Temp 98.4°F | Ht 64.0 in | Wt 118.3 lb

## 2024-04-22 DIAGNOSIS — R109 Unspecified abdominal pain: Secondary | ICD-10-CM

## 2024-04-22 DIAGNOSIS — G8929 Other chronic pain: Secondary | ICD-10-CM

## 2024-04-22 DIAGNOSIS — K52831 Collagenous colitis: Secondary | ICD-10-CM | POA: Diagnosis not present

## 2024-04-22 DIAGNOSIS — K589 Irritable bowel syndrome without diarrhea: Secondary | ICD-10-CM

## 2024-04-22 DIAGNOSIS — K269 Duodenal ulcer, unspecified as acute or chronic, without hemorrhage or perforation: Secondary | ICD-10-CM

## 2024-04-22 DIAGNOSIS — F17210 Nicotine dependence, cigarettes, uncomplicated: Secondary | ICD-10-CM

## 2024-04-22 NOTE — H&P (View-Only) (Signed)
 Toribio Fortune, M.D. Gastroenterology & Hepatology [facility] [address] [place], [place] [address]  Primary Care Physician: Rosamond Leta NOVAK, MD [address] [place] [address]  I will communicate my assessment and recommendations to the referring MD via EMR.  Problems: Recurrent large gastroduodenal junction ulcer Collagenous colitis Chronic abdominal pain due to chronic ulcer  History of Present Illness: NIKIAH GOIN is a 58 y.o. 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   The patient was last seen on 12/22/2023. At that time, the patient was scheduled for EGD with finding described below.  Emphasized the importance of smoking cessation.  Patient was continued on Dexilant  60 mg twice daily and Carafate .  Notably, the patient was also ordered an empiric course for H. pylori with bismuth  quadruple regimen - Pylera for 10 days.  As patient presented persistent ulceration with persistent symptoms of abdominal pain, she was referred for evaluation for possible gastrectomy.    Patient complained in June of although recurrent issues with diarrhea, for which I started her on a budesonide  taper. She reports that she started taking budesonide  9 mg daily. She reports that she felt too hot and sweaty so she was advised to decrease to 6 mg qday. Currently, she is having 1 BM per day, which is mushy in consistency - not watery. No melena or hematochezia.  Patient reports that she is having persistent pain in her RUQ, which is present 24/7. Pain may radiate to her back and to her RLQ. States she has been taking Dexilant  60 mg BID and Carafate  every 6 hours. She took the 2 week course of antibiotic. She feels nauseated sometimes but no vomiting. She is scheduled to  see the GI surgeon next Wednesday at [facility].  Patient reports she quit smoking for some time but is now back smoking. She is now smoking 1-1.5 packs per day.  The patient denies having any  fever, chills, hematochezia, melena, hematemesis,  jaundice, pruritus.  Not taking any NSAIDs or high dose ASA.  CT abdomen and pelvis with contrast was performed on 07/08/2023 which showed thickening of the distal descending and sigmoid colon without inflammatory changes.  No other changes were found. Had MRI/MRCP with and without IV contrast on 08/07/2023 which showed thickening of the gastric antrum pylorus suggestive of inflammation.  Also had a HIDA scan on 12/15/2023 which did not show any biliary obstruction with ejection fraction of 89%.   Previous gastrin levels were 114 in 2019.  Repeat gastrin level on 01/01/2024 was 156.  Last EGD: 12/31/2023 - Normal esophagus. - Normal stomach. - Partially obstructing non- bleeding duodenal ulcer with a clean ulcer base ( Forrest Class III) . There is no evidence of perforation. Biopsied. - Normal examined duodenum.  Pathology showed changes consistent with ulceration without malignancy.  Last Colonoscopy: 10/25/2022 - The entire examined colon is normal. Biopsied. - Non- bleeding internal hemorrhoids. Repeat colonoscopy in 10 years  Past Medical History: Past Medical History:  Diagnosis Date   Anxiety    Cyst of right kidney    Depression    Essential hypertension, benign 12/26/2016   Fibroadenoma of right breast    S/P LUMPECTOMY W/ RADIOACTIVE SEED IMPLANT 05-19-2015   Fibromyalgia    GERD (gastroesophageal reflux disease)    History of cervical dysplasia    s/p conzation  and recurrent s/p  vaginal hysterectomy 2008   History of duodenal ulcer    1992--  S/P  REPAIR PERFORATED ULCER   History of gastric ulcer    age 82   Hypertension    IBS (irritable bowel syndrome)    Vaginal Pap smear, abnormal    VIN II (vulvar intraepithelial neoplasia II)     Wears glasses    Wears partial dentures    lower    Past Surgical History: Past Surgical History:  Procedure Laterality Date   BALLOON DILATION  12/21/2021   Procedure: BALLOON DILATION;  Surgeon: Eartha Angelia Sieving, MD;  [facility];  Service: Gastroenterology;;   BIOPSY  12/19/2021   Procedure: BIOPSY;  Surgeon: Eartha Angelia Sieving, MD;  [facility];  Service: Gastroenterology;;   BIOPSY  10/25/2022   Procedure: BIOPSY;  Surgeon: Eartha Angelia Sieving, MD;  [facility];  Service: Gastroenterology;;   BIOPSY  07/23/2023   Procedure: BIOPSY;  Surgeon: Eartha Angelia Sieving, MD;  [facility];  Service: Gastroenterology;;   BLADDER SLING PROCEDURE  2005  approx.   CERVICAL CONIZATION W/BX  2006  approx   CO2 LASER APPLICATION N/A 06/13/2016   Procedure: CO2 LASER OF THE VAGINA;  Surgeon: Maurilio Ship, MD;  Location: [facility];  Service: Gynecology;  Laterality: N/A;   COLONOSCOPY WITH PROPOFOL  N/A 10/25/2022   Procedure: COLONOSCOPY WITH PROPOFOL ;  Surgeon: Eartha Angelia Sieving, MD;  [facility];  Service: Gastroenterology;  Laterality: N/A;  9:45am, asa 1-2   ESOPHAGEAL DILATION N/A 06/14/2020   Procedure: ESOPHAGEAL DILATION;  Surgeon: Golda Claudis PENNER, MD;  [facility];  Service: Endoscopy;  Laterality: N/A;   ESOPHAGEAL DILATION N/A 10/04/2020   Procedure: ESOPHAGEAL DILATION;  Surgeon: Golda Claudis PENNER, MD;  [facility];  Service: Endoscopy;  Laterality: N/A;   ESOPHAGOGASTRODUODENOSCOPY N/A 01/21/2017   Procedure: ESOPHAGOGASTRODUODENOSCOPY (EGD);  Surgeon: Claudis PENNER Golda, MD;  [facility];  Service: Endoscopy;  Laterality: N/A;   ESOPHAGOGASTRODUODENOSCOPY N/A 07/17/2017   Procedure: ESOPHAGOGASTRODUODENOSCOPY (EGD);  Surgeon: Golda Claudis PENNER, MD;  [facility];  Service: Endoscopy;  Laterality: N/A;  1:00   ESOPHAGOGASTRODUODENOSCOPY N/A  10/16/2017   Procedure: ESOPHAGOGASTRODUODENOSCOPY (EGD)WITH DILATION;  Surgeon: Golda Claudis PENNER, MD;  [facility];  Service: Endoscopy;  Laterality: N/A;  1030   ESOPHAGOGASTRODUODENOSCOPY N/A 12/31/2023   Procedure: EGD (ESOPHAGOGASTRODUODENOSCOPY);  Surgeon: Eartha Angelia, Sieving, MD;  [facility];  Service: Gastroenterology;  Laterality: N/A;  9:45AM;ASA 1   ESOPHAGOGASTRODUODENOSCOPY (EGD) WITH PROPOFOL  N/A 01/23/2017   Procedure: ESOPHAGOGASTRODUODENOSCOPY (EGD) WITH PROPOFOL ;  Surgeon: Golda Claudis PENNER, MD;  [facility];  Service: Endoscopy;  Laterality: N/A;  duodenal striture dilation   ESOPHAGOGASTRODUODENOSCOPY (EGD) WITH PROPOFOL  N/A 06/14/2020   Procedure: ESOPHAGOGASTRODUODENOSCOPY (EGD) WITH PROPOFOL ;  Surgeon: Golda Claudis PENNER, MD;  [facility];  Service: Endoscopy;  Laterality: N/A;  1055   ESOPHAGOGASTRODUODENOSCOPY (EGD) WITH PROPOFOL  N/A 10/04/2020   Procedure: ESOPHAGOGASTRODUODENOSCOPY (EGD) WITH PROPOFOL ;  Surgeon: Golda Claudis PENNER, MD;  [facility];  Service: Endoscopy;  Laterality: N/A;  1:00   ESOPHAGOGASTRODUODENOSCOPY (EGD) WITH PROPOFOL  N/A 12/19/2021   Procedure: ESOPHAGOGASTRODUODENOSCOPY (EGD) WITH PROPOFOL ;  Surgeon: Eartha Angelia Sieving, MD;  [facility];  Service: Gastroenterology;  Laterality: N/A;  815   ESOPHAGOGASTRODUODENOSCOPY (EGD) WITH PROPOFOL  N/A 12/21/2021   Procedure: ESOPHAGOGASTRODUODENOSCOPY (EGD) WITH PROPOFOL ;  Surgeon: Eartha Angelia Sieving, MD;  [facility];  Service: Gastroenterology;  Laterality: N/A;  205 ASA 1   ESOPHAGOGASTRODUODENOSCOPY (EGD) WITH PROPOFOL  N/A 10/25/2022   Procedure: ESOPHAGOGASTRODUODENOSCOPY (EGD) WITH PROPOFOL ;  Surgeon: Eartha Angelia Sieving, MD;  [facility];  Service: Gastroenterology;  Laterality: N/A;   ESOPHAGOGASTRODUODENOSCOPY (EGD) WITH PROPOFOL  N/A 07/23/2023   Procedure: ESOPHAGOGASTRODUODENOSCOPY (EGD) WITH  PROPOFOL ;  Surgeon: Eartha Angelia Sieving, MD;  [facility];  Service: Gastroenterology;  Laterality: N/A;  2:45PM;ASA 1 per Tanya to 10/30 at 12:45   LAPAROSCOPY N/A 12/19/2017   Procedure: LAPAROSCOPY, PYLOROPLASTY WITH ARLYSS LISLE;  Surgeon: Gladis Cough, MD;  Location: [facility];  Service: General;  Laterality: N/A;   NASAL SEPTOPLASTY W/ TURBINOPLASTY Bilateral 04/30/2019   Procedure: BILATERAL NASAL SEPTOPLASTY WITH TURBINATE REDUCTION;  Surgeon: Karis Clunes, MD;  Location: [facility];  Service: ENT;  Laterality: Bilateral;   RADIOACTIVE SEED GUIDED EXCISIONAL BREAST BIOPSY Right 05/19/2015   Procedure: EXCISIONAL RIGHT BREAST MASS WITH RADIOACTIVE SEED LOCALIZATION;  Surgeon: Elon Pacini, MD;  Location: [facility];  Service: General;  Laterality: Right;   REPAIR OF PERFORATED ULCER  1992   duodenal   VAGINAL HYSTERECTOMY  11/13/2006    Family History: Family History  Problem Relation Age of Onset   Clotting disorder Father    Diabetes Father    Hypertension Father    Hypertension Mother    Kidney failure Mother    Hypertension Brother    Liver cancer Brother    Other Brother        cyst on kidney   Hypertension Sister    Hypertension Maternal Aunt    Hypertension Maternal Uncle    Hypertension Paternal Aunt    Hypertension Paternal Uncle     Social History: Social History   Tobacco Use  Smoking Status Every Day   Current packs/day: 1.00   Average packs/day: 1 pack/day for 18.0 years (18.0 ttl pk-yrs)   Types: Cigarettes   Passive exposure: Current  Smokeless Tobacco Never   Social History   Substance and Sexual Activity  Alcohol Use No   Social History   Substance and Sexual Activity  Drug Use No    Allergies: Allergies  Allergen Reactions   Codeine Nausea Only   Hydrocodone  Nausea Only    feels hot and insomia   Sulfa Antibiotics Hives   Penicillin V Nausea Only    Medications: Current Outpatient  Medications  Medication Sig Dispense Refill   ALPRAZolam (XANAX) 0.25 MG tablet Take 0.25 mg by mouth 2 (two) times daily as needed for anxiety.     alum & mag hydroxide-simeth (MAALOX/MYLANTA) 200-200-20 MG/5ML suspension Take 15 mLs by mouth every 6 (six) hours as needed for indigestion or heartburn.     amLODipine (NORVASC) 10 MG tablet Take 10 mg by mouth daily as needed (High blood pressure).     Bismuth  262 MG CHEW Chew 2 each by mouth every 6 (six) hours. 112 tablet 0   budesonide  (ENTOCORT EC ) 3 MG 24 hr capsule Take 3 capsules (9 mg total) by mouth daily for 60 days, THEN 2 capsules (6 mg total) daily for 60 days, THEN 1 capsule (3 mg total) daily. 360 capsule 0   DEXILANT  60 MG capsule TAKE ONE CAPSULE BY MOUTH TWICE DAILY BEFORE a meal 60 capsule 2   estradiol  (VIVELLE -DOT) 0.1 MG/24HR patch place 1 PATCH onto THE SKIN twice WEEKLY 40 patch 0   hyoscyamine  (LEVSIN  SL) 0.125 MG SL tablet Place 1 tablet (  0.125 mg total) under the tongue every 8 (eight) hours as needed for cramping (abdominal pain). 90 tablet 2   Multiple Vitamin (MULTIVITAMIN WITH MINERALS) TABS tablet Take 1 tablet by mouth daily.     ondansetron  (ZOFRAN ) 4 MG tablet Take 1 tablet (4 mg total) by mouth every 6 (six) hours. As needed for nausea vomiting 12 tablet 0   potassium chloride  (MICRO-K ) 10 MEQ CR capsule Take 10 mEq by mouth daily.     Probiotic Product (PROBIOTIC DAILY PO) Take 1 tablet by mouth daily.     sucralfate  (CARAFATE ) 1 g tablet Take 1 tablet (1 g total) by mouth 4 (four) times daily -  with meals and at bedtime. 120 tablet 3   traMADol  (ULTRAM ) 50 MG tablet Take 50 mg by mouth every 6 (six) hours as needed for moderate pain.      valACYclovir (VALTREX) 1000 MG tablet Take 1,000 mg by mouth daily as needed (Flair -up).  5   Current Facility-Administered Medications  Medication Dose Route Frequency Provider Last Rate Last Admin   acetaminophen  (TYLENOL ) tablet 650 mg  650 mg Oral Once Vyas, Dhruv B, MD        diphenhydrAMINE  (BENADRYL ) capsule 25 mg  25 mg Oral Once Vyas, Dhruv B, MD        Review of Systems: GENERAL: negative for malaise, night sweats HEENT: No changes in hearing or vision, no nose bleeds or other nasal problems. NECK: Negative for lumps, goiter, pain and significant neck swelling RESPIRATORY: Negative for cough, wheezing CARDIOVASCULAR: Negative for chest pain, leg swelling, palpitations, orthopnea GI: SEE HPI MUSCULOSKELETAL: Negative for joint pain or swelling, back pain, and muscle pain. SKIN: Negative for lesions, rash PSYCH: Negative for sleep disturbance, mood disorder and recent psychosocial stressors. HEMATOLOGY Negative for prolonged bleeding, bruising easily, and swollen nodes. ENDOCRINE: Negative for cold or heat intolerance, polyuria, polydipsia and goiter. NEURO: negative for tremor, gait imbalance, syncope and seizures. The remainder of the review of systems is noncontributory.   Physical Exam: BP 127/86   Pulse 84   Temp 98.4 F (36.9 C)   Ht 5' 4 (1.626 m)   Wt 118 lb 4.8 oz (53.7 kg)   BMI 20.31 kg/m  GENERAL: The patient is AO x3, in no acute distress. HEENT: Head is normocephalic and atraumatic. EOMI are intact. Mouth is well hydrated and without lesions. NECK: Supple. No masses LUNGS: Clear to auscultation. No presence of rhonchi/wheezing/rales. Adequate chest expansion HEART: RRR, normal s1 and s2. ABDOMEN: Tender upon palpation of the right flank and right upper quadrant, also of the epigastric area, no guarding, no peritoneal signs, and nondistended. BS +. No masses. EXTREMITIES: Without any cyanosis, clubbing, rash, lesions or edema. NEUROLOGIC: AOx3, no focal motor deficit. SKIN: no jaundice, no rashes  Imaging/Labs: as above  I personally reviewed and interpreted the available labs, imaging and endoscopic files.  Impression and Plan: KEDRA MCGLADE is a 58 y.o. female with history of recurrent PUD and duodenal stricture  secondary to NSAIDs, depression, anxiety, fibromyalgia, collagenous colitis, GERD, IBS, hypertension,  who presents for follow up of duodenal ulcer and collagenous colitis.   Patient has complicated history of severe duodenal ulceration extending to the stomach at the level of the anastomosis.  She has had difficult management of this despite taking Dexilant  and Carafate  on a regular basis.  Despite not using NSAIDs, she has had persistent ulceration which has been concerning.  Notably, she has not been able to completely quit smoking  which is not allowing adequate healing of her ulceration.  I emphasized the extreme importance of this.  She is also supposed to see a surgeon next week to evaluate possible partial gastric resection.  She is not complete decided to pursue this but I encouraged her to discuss with him that her surgical options given the poor response to treatment.  As she is extremely concerned about missing some other diagnoses like a cancer, we will schedule her for a CT abdomen and pelvis with IV contrast to give her some reassurance.  She will need to continue with her current medication regimen for now.  She is also presenting history of recurrent diarrhea due to collagenous colitis.  Again, the likely trigger for this is her chronic smoking.  I encouraged her to quit smoking.  Will continue with budesonide  taper for now, but given recurrent symptoms she may end up having to take this medication chronically.  -Smoking cessation is imperative -If presenting worsening severe abdominal pain, may need to have urgent evaluation in the ER -Continue Dexilant  60 mg twice daily -Continue sucralfate  1 g every 6 hours -Proceed with surgical evaluation at [facility] next week -Continue with budesonide  6 mg (2 pills) for 2 months, then decrease to 3 mg (1 pill) for 2 more months.  If noticing any worsening diarrhea, please notify us  as we will need to increase back to 6 mg dosing.  If  presenting diarrhea when stopping budesonide , we will need to go back on 3 mg dosing indefinitely. -Schedule CT abdomen/pelvis with IV contrast  All questions were answered.      Toribio Fortune, MD Gastroenterology and Hepatology [facility] Gastroenterology

## 2024-04-22 NOTE — Patient Instructions (Addendum)
 Smoking cessation is imperative If presenting worsening severe abdominal pain, may need to have urgent evaluation in the ER Continue Dexilant  60 mg twice daily Continue sucralfate  1 g every 6 hours Proceed with surgical evaluation at [facility] next week Continue with budesonide  6 mg (2 pills) for 2 months, then decrease to 3 mg (1 pill) for 2 more months.  If noticing any worsening diarrhea, please notify us  as we will need to increase back to 6 mg dosing.  If presenting diarrhea when stopping budesonide , we will need to go back on 3 mg dosing indefinitely. Schedule CT abdomen/pelvis with IV contrast

## 2024-04-22 NOTE — Telephone Encounter (Signed)
 [employer] PA: Authorization Number: [id number] Case Number: [id number] Review Date: 04/22/2024 3:01:29 PM Expiration Date: 06/06/2024 Status: Your case has been Approved.

## 2024-04-22 NOTE — Progress Notes (Unsigned)
 [name], M.D. Gastroenterology & Hepatology [facility] [address] [place], [place] [address]  Primary Care Physician: Rosamond Leta NOVAK, MD [address] [place] [address]  I will communicate my assessment and recommendations to the referring MD via EMR.  Problems: Recurrent large gastroduodenal junction ulcer Collagenous colitis Chronic abdominal pain due to chronic ulcer  History of Present Illness: [name] is a 58 y.o. 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   The patient was last seen on 12/22/2023. At that time, the patient was scheduled for EGD with finding described below.  Emphasized the importance of smoking cessation.  Patient was continued on Dexilant  60 mg twice daily and Carafate .  Notably, the patient was also ordered an empiric course for H. pylori with bismuth  quadruple regimen - Pylera for 10 days.  As patient presented persistent ulceration with persistent symptoms of abdominal pain, she was referred for evaluation for possible gastrectomy.    Patient complains in June of although recurrent issues with diarrhea, for which I started her on a budesonide  taper.  She reports that she started taking budesonide  9 mg daily. She reports that she felt too hot and sweaty so she was advised to decrease to 6 mg qday. Currently, she is having 1 BM per day, which is mushy in consistency - not watery. No melena or hematochezia.  Patient reports that she is having persistent pain in her RUQ, which is present 24/7. Pain may radiate to her back and to her RLQ. States she has been taking Dexilant  60 mg BID and Carafate  every 6 hours. She took the 2 week course of antibiotic. She feels nauseated sometimes but no vomiting. She is scheduled to  see the GI surgeon next Wednesday at [facility].  Patient reports she quit smoking for some time but is now back smoking. She is now smoking 1-1.5 packs per day.  The patient denies having any  fever, chills, hematochezia, melena, hematemesis, abdominal distention, diarrhea, jaundice, pruritus or weight loss.   Not taking any NSAIDs or high dose ASA.  CT abdomen and pelvis with contrast was performed on 07/08/2023 which showed thickening of the distal descending and sigmoid colon without inflammatory changes.  No other changes were found. Had MRI ***   Previous gastrin levels were 114 in 2019.  Repeat gastrin level on 01/01/2024 was 156.  Last EGD: 12/31/2023 - Normal esophagus. - Normal stomach. - Partially obstructing non- bleeding duodenal ulcer with a clean ulcer base ( Forrest Class III) . There is no evidence of perforation. Biopsied. - Normal examined duodenum.  Pathology showed changes consistent with ulceration without malignancy.  Last Colonoscopy: 10/25/2022 - The entire examined colon is normal. Biopsied. - Non- bleeding internal hemorrhoids. Repeat colonoscopy in 10 years  Past Medical History: Past Medical History:  Diagnosis Date   Anxiety    Cyst of right kidney    Depression    Essential hypertension, benign 12/26/2016   Fibroadenoma of right breast    S/P LUMPECTOMY W/ RADIOACTIVE SEED IMPLANT 05-19-2015   Fibromyalgia    GERD (gastroesophageal reflux disease)    History of cervical dysplasia    s/p conzation and recurrent s/p  vaginal hysterectomy 2008   History of duodenal ulcer    1992--  S/P  REPAIR PERFORATED ULCER   History of gastric ulcer  age 4   Hypertension    IBS (irritable bowel syndrome)    Vaginal Pap smear, abnormal    VIN II (vulvar intraepithelial neoplasia II)    Wears glasses    Wears partial dentures    lower    Past Surgical History: Past Surgical History:  Procedure Laterality Date   BALLOON DILATION  12/21/2021   Procedure:  BALLOON DILATION;  Surgeon: Eartha Angelia Sieving, MD;  [facility];  Service: Gastroenterology;;   BIOPSY  12/19/2021   Procedure: BIOPSY;  Surgeon: Eartha Angelia Sieving, MD;  [facility];  Service: Gastroenterology;;   BIOPSY  10/25/2022   Procedure: BIOPSY;  Surgeon: Eartha Angelia Sieving, MD;  [facility];  Service: Gastroenterology;;   BIOPSY  07/23/2023   Procedure: BIOPSY;  Surgeon: Eartha Angelia Sieving, MD;  [facility];  Service: Gastroenterology;;   BLADDER SLING PROCEDURE  2005  approx.   CERVICAL CONIZATION W/BX  2006  approx   CO2 LASER APPLICATION N/A 06/13/2016   Procedure: CO2 LASER OF THE VAGINA;  Surgeon: Maurilio Ship, MD;  Location: [facility];  Service: Gynecology;  Laterality: N/A;   COLONOSCOPY WITH PROPOFOL  N/A 10/25/2022   Procedure: COLONOSCOPY WITH PROPOFOL ;  Surgeon: Eartha Angelia Sieving, MD;  [facility];  Service: Gastroenterology;  Laterality: N/A;  9:45am, asa 1-2   ESOPHAGEAL DILATION N/A 06/14/2020   Procedure: ESOPHAGEAL DILATION;  Surgeon: Golda Claudis PENNER, MD;  [facility];  Service: Endoscopy;  Laterality: N/A;   ESOPHAGEAL DILATION N/A 10/04/2020   Procedure: ESOPHAGEAL DILATION;  Surgeon: Golda Claudis PENNER, MD;  [facility];  Service: Endoscopy;  Laterality: N/A;   ESOPHAGOGASTRODUODENOSCOPY N/A 01/21/2017   Procedure: ESOPHAGOGASTRODUODENOSCOPY (EGD);  Surgeon: Claudis PENNER Golda, MD;  [facility];  Service: Endoscopy;  Laterality: N/A;   ESOPHAGOGASTRODUODENOSCOPY N/A 07/17/2017   Procedure: ESOPHAGOGASTRODUODENOSCOPY (EGD);  Surgeon: Golda Claudis PENNER, MD;  [facility];  Service: Endoscopy;  Laterality: N/A;  1:00   ESOPHAGOGASTRODUODENOSCOPY N/A 10/16/2017   Procedure: ESOPHAGOGASTRODUODENOSCOPY (EGD)WITH DILATION;  Surgeon: Golda Claudis PENNER, MD;  [facility];  Service: Endoscopy;  Laterality: N/A;  1030    ESOPHAGOGASTRODUODENOSCOPY N/A 12/31/2023   Procedure: EGD (ESOPHAGOGASTRODUODENOSCOPY);  Surgeon: Eartha Angelia, Sieving, MD;  [facility];  Service: Gastroenterology;  Laterality: N/A;  9:45AM;ASA 1   ESOPHAGOGASTRODUODENOSCOPY (EGD) WITH PROPOFOL  N/A 01/23/2017   Procedure: ESOPHAGOGASTRODUODENOSCOPY (EGD) WITH PROPOFOL ;  Surgeon: Golda Claudis PENNER, MD;  [facility];  Service: Endoscopy;  Laterality: N/A;  duodenal striture dilation   ESOPHAGOGASTRODUODENOSCOPY (EGD) WITH PROPOFOL  N/A 06/14/2020   Procedure: ESOPHAGOGASTRODUODENOSCOPY (EGD) WITH PROPOFOL ;  Surgeon: Golda Claudis PENNER, MD;  [facility];  Service: Endoscopy;  Laterality: N/A;  1055   ESOPHAGOGASTRODUODENOSCOPY (EGD) WITH PROPOFOL  N/A 10/04/2020   Procedure: ESOPHAGOGASTRODUODENOSCOPY (EGD) WITH PROPOFOL ;  Surgeon: Golda Claudis PENNER, MD;  [facility];  Service: Endoscopy;  Laterality: N/A;  1:00   ESOPHAGOGASTRODUODENOSCOPY (EGD) WITH PROPOFOL  N/A 12/19/2021   Procedure: ESOPHAGOGASTRODUODENOSCOPY (EGD) WITH PROPOFOL ;  Surgeon: Eartha Angelia Sieving, MD;  [facility];  Service: Gastroenterology;  Laterality: N/A;  815   ESOPHAGOGASTRODUODENOSCOPY (EGD) WITH PROPOFOL  N/A 12/21/2021   Procedure: ESOPHAGOGASTRODUODENOSCOPY (EGD) WITH PROPOFOL ;  Surgeon: Eartha Angelia Sieving, MD;  [facility];  Service: Gastroenterology;  Laterality: N/A;  205 ASA 1   ESOPHAGOGASTRODUODENOSCOPY (EGD) WITH PROPOFOL  N/A 10/25/2022   Procedure: ESOPHAGOGASTRODUODENOSCOPY (EGD) WITH PROPOFOL ;  Surgeon: Eartha  Angelia Sieving, MD;  [facility];  Service: Gastroenterology;  Laterality: N/A;   ESOPHAGOGASTRODUODENOSCOPY (EGD) WITH PROPOFOL  N/A 07/23/2023   Procedure: ESOPHAGOGASTRODUODENOSCOPY (EGD) WITH PROPOFOL ;  Surgeon: Eartha Angelia Sieving, MD;  [facility];  Service: Gastroenterology;  Laterality: N/A;  2:45PM;ASA 1 per Tanya to 10/30 at 12:45    LAPAROSCOPY N/A 12/19/2017   Procedure: LAPAROSCOPY, PYLOROPLASTY WITH ARLYSS LISLE;  Surgeon: Gladis Cough, MD;  Location: [facility];  Service: General;  Laterality: N/A;   NASAL SEPTOPLASTY W/ TURBINOPLASTY Bilateral 04/30/2019   Procedure: BILATERAL NASAL SEPTOPLASTY WITH TURBINATE REDUCTION;  Surgeon: Karis Clunes, MD;  Location: [facility];  Service: ENT;  Laterality: Bilateral;   RADIOACTIVE SEED GUIDED EXCISIONAL BREAST BIOPSY Right 05/19/2015   Procedure: EXCISIONAL RIGHT BREAST MASS WITH RADIOACTIVE SEED LOCALIZATION;  Surgeon: Elon Pacini, MD;  Location: [facility];  Service: General;  Laterality: Right;   REPAIR OF PERFORATED ULCER  1992   duodenal   VAGINAL HYSTERECTOMY  11/13/2006    Family History: Family History  Problem Relation Age of Onset   Clotting disorder Father    Diabetes Father    Hypertension Father    Hypertension Mother    Kidney failure Mother    Hypertension Brother    Liver cancer Brother    Other Brother        cyst on kidney   Hypertension Sister    Hypertension Maternal Aunt    Hypertension Maternal Uncle    Hypertension Paternal Aunt    Hypertension Paternal Uncle     Social History: Social History   Tobacco Use  Smoking Status Every Day   Current packs/day: 1.00   Average packs/day: 1 pack/day for 18.0 years (18.0 ttl pk-yrs)   Types: Cigarettes   Passive exposure: Current  Smokeless Tobacco Never   Social History   Substance and Sexual Activity  Alcohol Use No   Social History   Substance and Sexual Activity  Drug Use No    Allergies: Allergies  Allergen Reactions   Codeine Nausea Only   Hydrocodone  Nausea Only    feels hot and insomia   Sulfa Antibiotics Hives   Penicillin V Nausea Only    Medications: Current Outpatient Medications  Medication Sig Dispense Refill   ALPRAZolam (XANAX) 0.25 MG tablet Take 0.25 mg by mouth 2 (two) times daily as needed for anxiety.     alum & mag  hydroxide-simeth (MAALOX/MYLANTA) 200-200-20 MG/5ML suspension Take 15 mLs by mouth every 6 (six) hours as needed for indigestion or heartburn.     amLODipine (NORVASC) 10 MG tablet Take 10 mg by mouth daily as needed (High blood pressure).     Bismuth  262 MG CHEW Chew 2 each by mouth every 6 (six) hours. 112 tablet 0   budesonide  (ENTOCORT EC ) 3 MG 24 hr capsule Take 3 capsules (9 mg total) by mouth daily for 60 days, THEN 2 capsules (6 mg total) daily for 60 days, THEN 1 capsule (3 mg total) daily. 360 capsule 0   DEXILANT  60 MG capsule TAKE ONE CAPSULE BY MOUTH TWICE DAILY BEFORE a meal 60 capsule 2   estradiol  (VIVELLE -DOT) 0.1 MG/24HR patch place 1 PATCH onto THE SKIN twice WEEKLY 40 patch 0   hyoscyamine  (LEVSIN  SL) 0.125 MG SL tablet Place 1 tablet (0.125 mg total) under the tongue every 8 (eight) hours as needed for cramping (abdominal pain). 90 tablet 2   Multiple Vitamin (MULTIVITAMIN WITH MINERALS) TABS tablet Take 1 tablet by  mouth daily.     ondansetron  (ZOFRAN ) 4 MG tablet Take 1 tablet (4 mg total) by mouth every 6 (six) hours. As needed for nausea vomiting 12 tablet 0   potassium chloride  (MICRO-K ) 10 MEQ CR capsule Take 10 mEq by mouth daily.     Probiotic Product (PROBIOTIC DAILY PO) Take 1 tablet by mouth daily.     sucralfate  (CARAFATE ) 1 g tablet Take 1 tablet (1 g total) by mouth 4 (four) times daily -  with meals and at bedtime. 120 tablet 3   traMADol  (ULTRAM ) 50 MG tablet Take 50 mg by mouth every 6 (six) hours as needed for moderate pain.      valACYclovir (VALTREX) 1000 MG tablet Take 1,000 mg by mouth daily as needed (Flair -up).  5   Current Facility-Administered Medications  Medication Dose Route Frequency Provider Last Rate Last Admin   acetaminophen  (TYLENOL ) tablet 650 mg  650 mg Oral Once Vyas, Dhruv B, MD       diphenhydrAMINE  (BENADRYL ) capsule 25 mg  25 mg Oral Once Vyas, Dhruv B, MD        Review of Systems: GENERAL: negative for malaise, night  sweats HEENT: No changes in hearing or vision, no nose bleeds or other nasal problems. NECK: Negative for lumps, goiter, pain and significant neck swelling RESPIRATORY: Negative for cough, wheezing CARDIOVASCULAR: Negative for chest pain, leg swelling, palpitations, orthopnea GI: SEE HPI MUSCULOSKELETAL: Negative for joint pain or swelling, back pain, and muscle pain. SKIN: Negative for lesions, rash PSYCH: Negative for sleep disturbance, mood disorder and recent psychosocial stressors. HEMATOLOGY Negative for prolonged bleeding, bruising easily, and swollen nodes. ENDOCRINE: Negative for cold or heat intolerance, polyuria, polydipsia and goiter. NEURO: negative for tremor, gait imbalance, syncope and seizures. The remainder of the review of systems is noncontributory.   Physical Exam: BP 127/86   Pulse 84   Temp 98.4 F (36.9 C)   Ht 5' 4 (1.626 m)   Wt 118 lb 4.8 oz (53.7 kg)   BMI 20.31 kg/m  GENERAL: The patient is AO x3, in no acute distress. HEENT: Head is normocephalic and atraumatic. EOMI are intact. Mouth is well hydrated and without lesions. NECK: Supple. No masses LUNGS: Clear to auscultation. No presence of rhonchi/wheezing/rales. Adequate chest expansion HEART: RRR, normal s1 and s2. ABDOMEN: Soft, nontender, no guarding, no peritoneal signs, and nondistended. BS +. No masses. RECTAL EXAM: no external lesions, normal tone, no masses, brown stool without blood.*** Chaperone: EXTREMITIES: Without any cyanosis, clubbing, rash, lesions or edema. NEUROLOGIC: AOx3, no focal motor deficit. SKIN: no jaundice, no rashes  Imaging/Labs: as above  I personally reviewed and interpreted the available labs, imaging and endoscopic files.  Impression and Plan: [name] is a 58 y.o. female coming for follow up of ***   All questions were answered.      Wendy Fortune, MD Gastroenterology and Hepatology [facility]

## 2024-04-28 ENCOUNTER — Encounter (INDEPENDENT_AMBULATORY_CARE_PROVIDER_SITE_OTHER): Payer: Self-pay | Admitting: Gastroenterology

## 2024-04-29 ENCOUNTER — Encounter: Payer: Self-pay | Admitting: *Deleted

## 2024-04-30 NOTE — Telephone Encounter (Signed)
 pt says they are wanting to have EGD done by you and she will have the fluoroscopy done at [facility] on 05/05/24. is it ok to schedule the EGD? your first date in on 05/12/24  Thu 14:31 I spoke to pt and she will be calling Dr.Westocott to make sure it is ok for you to do the EGD. What asa would she be if Dr.Westcott says ok?  33 mins pt returned call and stated that Dr.Westcott said ok for you to do EGD and he really appreciated it.  8 mins DC Toribio Eartha Flavors, MD ok sounds good, please schedule her in room 1, dx gastric ulcer thanks

## 2024-05-01 ENCOUNTER — Ambulatory Visit (HOSPITAL_BASED_OUTPATIENT_CLINIC_OR_DEPARTMENT_OTHER): Attending: Gastroenterology

## 2024-05-03 ENCOUNTER — Ambulatory Visit: Admitting: Adult Health

## 2024-05-05 NOTE — Telephone Encounter (Signed)
 [employer] PA for EGD: Covered/Approved/ Notification/prior authorization number [id number]  Start date 05/12/2024  End date  08/10/2024

## 2024-05-06 ENCOUNTER — Encounter (INDEPENDENT_AMBULATORY_CARE_PROVIDER_SITE_OTHER): Payer: Self-pay | Admitting: Gastroenterology

## 2024-05-12 ENCOUNTER — Encounter (HOSPITAL_COMMUNITY)
Admission: RE | Disposition: A | Discharge status: Home / Self Care | Payer: Self-pay | Source: Home / Self Care | Attending: Gastroenterology

## 2024-05-12 ENCOUNTER — Ambulatory Visit (HOSPITAL_COMMUNITY): Payer: Self-pay | Admitting: Anesthesiology

## 2024-05-12 ENCOUNTER — Ambulatory Visit (HOSPITAL_COMMUNITY)
Admission: RE | Admit: 2024-05-12 | Discharge: 2024-05-12 | Disposition: A | Discharge status: Home / Self Care | Attending: Gastroenterology | Admitting: Gastroenterology

## 2024-05-12 ENCOUNTER — Other Ambulatory Visit: Payer: Self-pay

## 2024-05-12 ENCOUNTER — Encounter (HOSPITAL_COMMUNITY): Payer: Self-pay | Admitting: Gastroenterology

## 2024-05-12 DIAGNOSIS — I10 Essential (primary) hypertension: Secondary | ICD-10-CM | POA: Diagnosis not present

## 2024-05-12 DIAGNOSIS — K269 Duodenal ulcer, unspecified as acute or chronic, without hemorrhage or perforation: Secondary | ICD-10-CM

## 2024-05-12 DIAGNOSIS — Z79899 Other long term (current) drug therapy: Secondary | ICD-10-CM | POA: Diagnosis not present

## 2024-05-12 DIAGNOSIS — F17210 Nicotine dependence, cigarettes, uncomplicated: Secondary | ICD-10-CM | POA: Diagnosis not present

## 2024-05-12 DIAGNOSIS — M797 Fibromyalgia: Secondary | ICD-10-CM | POA: Diagnosis not present

## 2024-05-12 DIAGNOSIS — K219 Gastro-esophageal reflux disease without esophagitis: Secondary | ICD-10-CM | POA: Insufficient documentation

## 2024-05-12 DIAGNOSIS — K449 Diaphragmatic hernia without obstruction or gangrene: Secondary | ICD-10-CM | POA: Diagnosis not present

## 2024-05-12 DIAGNOSIS — F419 Anxiety disorder, unspecified: Secondary | ICD-10-CM | POA: Diagnosis not present

## 2024-05-12 DIAGNOSIS — K52831 Collagenous colitis: Secondary | ICD-10-CM | POA: Insufficient documentation

## 2024-05-12 DIAGNOSIS — F32A Depression, unspecified: Secondary | ICD-10-CM | POA: Insufficient documentation

## 2024-05-12 HISTORY — PX: ESOPHAGOGASTRODUODENOSCOPY: SHX5428

## 2024-05-12 SURGERY — EGD (ESOPHAGOGASTRODUODENOSCOPY)
Anesthesia: General

## 2024-05-12 MED ORDER — LACTATED RINGERS IV SOLN: INTRAVENOUS | Status: DC

## 2024-05-12 MED ORDER — LIDOCAINE 2% (20 MG/ML) 5 ML SYRINGE
INTRAMUSCULAR | Status: DC | PRN
  Administered 2024-05-12: 80 mg via INTRAVENOUS

## 2024-05-12 MED ORDER — PROPOFOL 500 MG/50ML IV EMUL
INTRAVENOUS | Status: DC | PRN
Start: 2024-05-12 — End: 2024-05-12
  Administered 2024-05-12: 50 mg via INTRAVENOUS
  Administered 2024-05-12: 40 mg via INTRAVENOUS
  Administered 2024-05-12: 120 mg via INTRAVENOUS

## 2024-05-12 NOTE — Transfer of Care (Signed)
 Immediate Anesthesia Transfer of Care Note  Patient: [name]  Procedure(s) Performed: EGD (ESOPHAGOGASTRODUODENOSCOPY)  Patient Location: Endoscopy Unit  Anesthesia Type:General  Level of Consciousness: drowsy  Airway & Oxygen Therapy: Patient Spontanous Breathing  Post-op Assessment: Report given to RN and Post -op Vital signs reviewed and stable  Post vital signs: Reviewed and stable  Last Vitals:  Vitals Value Taken Time  BP 102/46 05/12/24 13:21  Temp 36.5 C 05/12/24 13:21  Pulse 96 05/12/24 13:21  Resp 12 05/12/24 13:21  SpO2 96 % 05/12/24 13:21    Last Pain:  Vitals:   05/12/24 1321  TempSrc: Axillary  PainSc:       Patients Stated Pain Goal: 7 (05/12/24 1249)  Complications: No notable events documented.

## 2024-05-12 NOTE — Interval H&P Note (Signed)
 History and Physical Interval Note:  05/12/2024 12:53 PM  [name]  has presented today for surgery, with the diagnosis of gastric ulcer.  The various methods of treatment have been discussed with the patient and family. After consideration of risks, benefits and other options for treatment, the patient has consented to  Procedure(s) with comments: EGD (ESOPHAGOGASTRODUODENOSCOPY) (N/A) - 2:15 pm, asa 1-2 as a surgical intervention.  The patient's history has been reviewed, patient examined, no change in status, stable for surgery.  I have reviewed the patient's chart and labs.  Questions were answered to the patient's satisfaction.     Akeisha Lagerquist Castaneda Mayorga

## 2024-05-12 NOTE — Anesthesia Procedure Notes (Signed)
 Date/Time: 05/12/2024 1:04 PM  Performed by: Barbarann Verneita RAMAN, CRNAPre-anesthesia Checklist: Patient identified, Emergency Drugs available, Suction available, Timeout performed and Patient being monitored Patient Re-evaluated:Patient Re-evaluated prior to induction Oxygen Delivery Method: Nasal Cannula

## 2024-05-12 NOTE — Anesthesia Preprocedure Evaluation (Signed)
 Anesthesia Evaluation  Patient identified by MRN, date of birth, ID band Patient awake    Reviewed: Allergy & Precautions, H&P , NPO status , Patient's Chart, lab work & pertinent test results, reviewed documented beta blocker date and time   Airway Mallampati: II  TM Distance: >3 FB Neck ROM: full    Dental no notable dental hx.    Pulmonary neg pulmonary ROS, Current Smoker   Pulmonary exam normal breath sounds clear to auscultation       Cardiovascular Exercise Tolerance: Good hypertension, negative cardio ROS  Rhythm:regular Rate:Normal     Neuro/Psych  PSYCHIATRIC DISORDERS Anxiety Depression     Neuromuscular disease negative neurological ROS  negative psych ROS   GI/Hepatic negative GI ROS, Neg liver ROS, PUD,GERD  ,,  Endo/Other  negative endocrine ROS    Renal/GU Renal diseasenegative Renal ROS  negative genitourinary   Musculoskeletal   Abdominal   Peds  Hematology negative hematology ROS (+) Blood dyscrasia, anemia   Anesthesia Other Findings   Reproductive/Obstetrics negative OB ROS                              Anesthesia Physical Anesthesia Plan  ASA: 2  Anesthesia Plan: General   Post-op Pain Management:    Induction:   PONV Risk Score and Plan: Propofol  infusion  Airway Management Planned:   Additional Equipment:   Intra-op Plan:   Post-operative Plan:   Informed Consent: I have reviewed the patients History and Physical, chart, labs and discussed the procedure including the risks, benefits and alternatives for the proposed anesthesia with the patient or authorized representative who has indicated his/her understanding and acceptance.     Dental Advisory Given  Plan Discussed with: CRNA  Anesthesia Plan Comments:         Anesthesia Quick Evaluation

## 2024-05-12 NOTE — Op Note (Signed)
 [facility] Patient Name: [name] Procedure Date: 05/12/2024 12:55 PM MRN: [id number] Date of Birth: October 23, 1965 Attending MD: Toribio Fortune , , [id number] CSN: [id number] Age: 58 Admit Type: Outpatient Procedure:                Upper GI endoscopy Indications:              Follow-up of chronic duodenal ulcer Providers:                Toribio Fortune, Crystal Page, Bascom Blush Referring MD:             Lupita PARAS. Westcott Medicines:                Monitored Anesthesia Care Complications:            No immediate complications. Estimated Blood Loss:     Estimated blood loss: none. Procedure:                Pre-Anesthesia Assessment:                           - Prior to the procedure, a History and Physical                            was performed, and patient medications, allergies                            and sensitivities were reviewed. The patient's                            tolerance of previous anesthesia was reviewed.                           - The risks and benefits of the procedure and the                            sedation options and risks were discussed with the                            patient. All questions were answered and informed                            consent was obtained.                           - ASA Grade Assessment: II - A patient with mild                            systemic disease.                           After obtaining informed consent, the endoscope was                            passed under direct vision. Throughout the                            procedure,  the patient's blood pressure, pulse, and                            oxygen saturations were monitored continuously. The                            [id number] ([id number]) Upper was introduced through                            the mouth, and advanced to the second part of                            duodenum. The upper GI endoscopy was accomplished                            without  difficulty. The patient tolerated the                            procedure well. Scope In: 1:08:40 PM Scope Out: 1:16:09 PM Total Procedure Duration: 0 hours 7 minutes 29 seconds  Findings:      A 2 cm hiatal hernia was present.      The entire examined stomach was normal.      One partially obstructing non-bleeding cratered duodenal ulcer with a       clean ulcer base (Forrest Class III) was found in the duodenal bulb, in       direct continuity to the pylorus. The lesion was 20-30 mm in largest       dimension. There is no evidence of perforation. Biopsies were taken with       a cold forceps for histology. Impression:               Size is similar to prior duodenal ulcer.                           - 2 cm hiatal hernia.                           - Normal stomach.                           - Partially obstructing non-bleeding duodenal ulcer                            with a clean ulcer base (Forrest Class III). There                            is no evidence of perforation. Size is similar to                            prior duodenal ulcer. Biopsied. Moderate Sedation:      Per Anesthesia Care Recommendation:           - Discharge patient to home (ambulatory).                           - Resume previous diet.                           -  Await pathology results.                           - ABSOLUTE SMOKING CESSATION.                           - Continue present medications.                           - Follow up with Dr. Marlyse. Procedure Code(s):        --- Professional ---                           [phone], Esophagogastroduodenoscopy, flexible,                            transoral; with biopsy, single or multiple Diagnosis Code(s):        --- Professional ---                           K44.9, Diaphragmatic hernia without obstruction or                            gangrene                           K26.9, Duodenal ulcer, unspecified as acute or                            chronic,  without hemorrhage or perforation                           K26.7, Chronic duodenal ulcer without hemorrhage or                            perforation CPT copyright 2022 American Medical Association. All rights reserved. The codes documented in this report are preliminary and upon coder review may  be revised to meet current compliance requirements. Toribio Fortune, MD Toribio Fortune,  05/12/2024 1:27:47 PM This report has been signed electronically. Number of Addenda: 0

## 2024-05-12 NOTE — Discharge Instructions (Signed)
 You are being discharged to home.  Resume your previous diet.  We are waiting for your pathology results.  ABSOLUTE SMOKING CESSATION. Continue present medications. Follow up with Dr. Marlyse.

## 2024-05-13 ENCOUNTER — Encounter (HOSPITAL_COMMUNITY): Payer: Self-pay | Admitting: Gastroenterology

## 2024-05-13 ENCOUNTER — Ambulatory Visit: Payer: Self-pay | Admitting: Gastroenterology

## 2024-05-13 ENCOUNTER — Telehealth (INDEPENDENT_AMBULATORY_CARE_PROVIDER_SITE_OTHER): Payer: Self-pay | Admitting: *Deleted

## 2024-05-13 LAB — SURGICAL PATHOLOGY

## 2024-05-13 NOTE — Telephone Encounter (Signed)
 report faxed to Dr Marlyse

## 2024-05-13 NOTE — Telephone Encounter (Signed)
-----   Message from Toribio Fortune Mayorga sent at 05/12/2024  1:38 PM EDT ----- Erskin Caldron, Can you please send Dr. Marlyse the report of the most recent EGD? Thanks

## 2024-05-17 NOTE — Anesthesia Postprocedure Evaluation (Signed)
 Anesthesia Post Note  Patient: [name]  Procedure(s) Performed: EGD (ESOPHAGOGASTRODUODENOSCOPY)  Patient location during evaluation: Phase II Anesthesia Type: General Level of consciousness: awake Pain management: pain level controlled Vital Signs Assessment: post-procedure vital signs reviewed and stable Respiratory status: spontaneous breathing and respiratory function stable Cardiovascular status: blood pressure returned to baseline and stable Postop Assessment: no headache and no apparent nausea or vomiting Anesthetic complications: no Comments: Late entry   No notable events documented.   Last Vitals:  Vitals:   05/12/24 1321 05/12/24 1325  BP: (!) 102/46 (!) 105/58  Pulse: 96 94  Resp: 12 (!) 26  Temp: 36.5 C   SpO2: 96% 95%    Last Pain:  Vitals:   05/12/24 1339  TempSrc:   PainSc: 8                  Yvonna JINNY Bosworth

## 2024-05-26 ENCOUNTER — Ambulatory Visit: Admitting: Physician Assistant

## 2024-06-10 ENCOUNTER — Other Ambulatory Visit (INDEPENDENT_AMBULATORY_CARE_PROVIDER_SITE_OTHER): Payer: Self-pay | Admitting: Gastroenterology

## 2024-06-10 DIAGNOSIS — K2970 Gastritis, unspecified, without bleeding: Secondary | ICD-10-CM

## 2024-06-26 ENCOUNTER — Encounter (INDEPENDENT_AMBULATORY_CARE_PROVIDER_SITE_OTHER): Payer: Self-pay | Admitting: Gastroenterology

## 2024-06-28 ENCOUNTER — Telehealth (INDEPENDENT_AMBULATORY_CARE_PROVIDER_SITE_OTHER): Payer: Self-pay

## 2024-06-28 ENCOUNTER — Other Ambulatory Visit (INDEPENDENT_AMBULATORY_CARE_PROVIDER_SITE_OTHER): Payer: Self-pay | Admitting: Gastroenterology

## 2024-06-28 ENCOUNTER — Encounter (INDEPENDENT_AMBULATORY_CARE_PROVIDER_SITE_OTHER): Payer: Self-pay

## 2024-06-28 DIAGNOSIS — K2970 Gastritis, unspecified, without bleeding: Secondary | ICD-10-CM | POA: Insufficient documentation

## 2024-06-28 NOTE — Telephone Encounter (Signed)
 06/28/2024 RE: Medication Service Request Approval Member ID: [id number] K DOB: March 25, 1966 Case ID: [id number] [name] [address] [place], [place] [address] Re: Initial Service Request Determination-Approval Dear [name]: [facility]  has reviewed the request for Dexlansopraz Cap 60mg  Dr submitted by Wendy Fortune Flavors on behalf of [name] on 06/28/2024. After review, the request for service is: Approved through 06/28/2025.

## 2024-06-28 NOTE — Telephone Encounter (Signed)
 06/28/2024 RE: Medication Service Request Approval Member ID: [id number] K DOB: March 25, 1966 Case ID: [id number] [name] [address] [place], [place] [address] Re: Initial Service Request Determination-Approval Dear [name]: [facility]  has reviewed the request for Dexlansopraz Cap 60mg  Dr submitted by Wendy Fortune Flavors on behalf of Sherley Mckenney on 06/28/2024. After review, the request for service is: Approved through 06/28/2025.

## 2024-07-05 ENCOUNTER — Ambulatory Visit: Admitting: Adult Health

## 2024-07-07 ENCOUNTER — Encounter (INDEPENDENT_AMBULATORY_CARE_PROVIDER_SITE_OTHER): Payer: Self-pay | Admitting: Gastroenterology

## 2024-07-08 ENCOUNTER — Other Ambulatory Visit (INDEPENDENT_AMBULATORY_CARE_PROVIDER_SITE_OTHER): Payer: Self-pay

## 2024-07-08 DIAGNOSIS — G8929 Other chronic pain: Secondary | ICD-10-CM

## 2024-07-08 DIAGNOSIS — K269 Duodenal ulcer, unspecified as acute or chronic, without hemorrhage or perforation: Secondary | ICD-10-CM

## 2024-07-08 MED ORDER — SUCRALFATE 1 G PO TABS: 1.0000 g | ORAL_TABLET | Freq: Three times a day (TID) | ORAL | 3 refills | Status: AC

## 2024-07-26 ENCOUNTER — Encounter: Payer: Self-pay | Admitting: Radiology

## 2024-08-29 ENCOUNTER — Other Ambulatory Visit: Payer: Self-pay | Admitting: Adult Health

## 2024-08-31 ENCOUNTER — Telehealth: Payer: Self-pay | Admitting: Adult Health

## 2024-08-31 MED ORDER — ESTRADIOL 0.1 MG/24HR TD PTTW: 1.0000 | MEDICATED_PATCH | TRANSDERMAL | 0 refills | Status: AC

## 2024-08-31 NOTE — Telephone Encounter (Signed)
 Patient is requesting refill on estradiol  patch. Please advise.

## 2024-08-31 NOTE — Addendum Note (Signed)
 Addended by: Duchess Armendarez A on: 08/31/2024 12:17 PM   Modules accepted: Orders

## 2024-08-31 NOTE — Telephone Encounter (Signed)
 Refilled vivelle  dot patch, till appt

## 2024-08-31 NOTE — Telephone Encounter (Signed)
 Pt is requesting a refill on Estradiol  patch. She just had stomach surgery and has an appt here in Feb. JAG reviewed chart and advised would refill med until pt's appt. Pt voiced understanding. JSY

## 2024-10-14 ENCOUNTER — Encounter (INDEPENDENT_AMBULATORY_CARE_PROVIDER_SITE_OTHER): Payer: Self-pay | Admitting: Gastroenterology

## 2024-10-26 ENCOUNTER — Telehealth (INDEPENDENT_AMBULATORY_CARE_PROVIDER_SITE_OTHER): Payer: Self-pay

## 2024-10-26 DIAGNOSIS — K2970 Gastritis, unspecified, without bleeding: Secondary | ICD-10-CM

## 2024-10-26 MED ORDER — DEXLANSOPRAZOLE 60 MG PO CPDR: 60.0000 mg | DELAYED_RELEASE_CAPSULE | Freq: Two times a day (BID) | ORAL | 2 refills | Status: AC

## 2024-11-09 ENCOUNTER — Ambulatory Visit: Admitting: Adult Health
